# Patient Record
Sex: Female | Born: 1960 | Race: White | Hispanic: No | Marital: Married | Smoking: Former smoker
Health system: Southern US, Community
[De-identification: ages and names within clinical notes are randomized; demographics above are authoritative.]

## PROBLEM LIST (undated history)

## (undated) DIAGNOSIS — S6981XA Other specified injuries of right wrist, hand and finger(s), initial encounter: Secondary | ICD-10-CM

## (undated) DIAGNOSIS — I10 Essential (primary) hypertension: Secondary | ICD-10-CM

## (undated) DIAGNOSIS — N951 Menopausal and female climacteric states: Secondary | ICD-10-CM

## (undated) DIAGNOSIS — E039 Hypothyroidism, unspecified: Secondary | ICD-10-CM

## (undated) DIAGNOSIS — I313 Pericardial effusion (noninflammatory): Secondary | ICD-10-CM

## (undated) DIAGNOSIS — M5136 Other intervertebral disc degeneration, lumbar region: Secondary | ICD-10-CM

## (undated) DIAGNOSIS — N926 Irregular menstruation, unspecified: Secondary | ICD-10-CM

## (undated) DIAGNOSIS — G4700 Insomnia, unspecified: Secondary | ICD-10-CM

## (undated) DIAGNOSIS — E538 Deficiency of other specified B group vitamins: Secondary | ICD-10-CM

## (undated) DIAGNOSIS — S82832A Other fracture of upper and lower end of left fibula, initial encounter for closed fracture: Secondary | ICD-10-CM

## (undated) DIAGNOSIS — S52501A Unspecified fracture of the lower end of right radius, initial encounter for closed fracture: Secondary | ICD-10-CM

## (undated) DIAGNOSIS — M8580 Other specified disorders of bone density and structure, unspecified site: Secondary | ICD-10-CM

## (undated) DIAGNOSIS — R319 Hematuria, unspecified: Secondary | ICD-10-CM

## (undated) DIAGNOSIS — N200 Calculus of kidney: Secondary | ICD-10-CM

## (undated) DIAGNOSIS — H6983 Other specified disorders of Eustachian tube, bilateral: Secondary | ICD-10-CM

## (undated) DIAGNOSIS — Z780 Asymptomatic menopausal state: Secondary | ICD-10-CM

## (undated) DIAGNOSIS — F329 Major depressive disorder, single episode, unspecified: Secondary | ICD-10-CM

## (undated) DIAGNOSIS — S62151A Displaced fracture of hook process of hamate [unciform] bone, right wrist, initial encounter for closed fracture: Secondary | ICD-10-CM

## (undated) DIAGNOSIS — N6459 Other signs and symptoms in breast: Secondary | ICD-10-CM

## (undated) HISTORY — DX: Deficiency of other specified B group vitamins: E53.8

## (undated) HISTORY — DX: Other signs and symptoms in breast: N64.59

## (undated) HISTORY — DX: Major depressive disorder, single episode, unspecified: F32.9

## (undated) HISTORY — DX: Other specified disorders of eustachian tube, bilateral: H69.83

## (undated) HISTORY — DX: Calculus of kidney: N20.0

## (undated) HISTORY — DX: Other specified disorders of bone density and structure, unspecified site: M85.80

## (undated) HISTORY — PX: GYNECOLOGIC CRYOSURGERY: SHX857

## (undated) HISTORY — DX: Other fracture of upper and lower end of left fibula, initial encounter for closed fracture: S82.832A

## (undated) HISTORY — DX: Insomnia, unspecified: G47.00

## (undated) HISTORY — DX: Hematuria, unspecified: R31.9

## (undated) HISTORY — DX: Irregular menstruation, unspecified: N92.6

## (undated) HISTORY — DX: Hypothyroidism, unspecified: E03.9

## (undated) HISTORY — DX: Unspecified fracture of the lower end of right radius, initial encounter for closed fracture: S52.501A

## (undated) HISTORY — DX: Essential (primary) hypertension: I10

## (undated) HISTORY — DX: Displaced fracture of hook process of hamate (unciform) bone, right wrist, initial encounter for closed fracture: S62.151A

## (undated) HISTORY — DX: Menopausal and female climacteric states: N95.1

## (undated) HISTORY — DX: Pericardial effusion (noninflammatory): I31.3

## (undated) HISTORY — DX: Other intervertebral disc degeneration, lumbar region: M51.36

## (undated) HISTORY — DX: Asymptomatic menopausal state: Z78.0

## (undated) HISTORY — DX: Other specified injuries of right wrist, hand and finger(s), initial encounter: S69.81XA

---

## 2008-04-25 ENCOUNTER — Ambulatory Visit: Payer: Self-pay | Admitting: Family Medicine

## 2008-04-25 ENCOUNTER — Telehealth (INDEPENDENT_AMBULATORY_CARE_PROVIDER_SITE_OTHER): Payer: Self-pay | Admitting: *Deleted

## 2008-04-25 DIAGNOSIS — Z780 Asymptomatic menopausal state: Secondary | ICD-10-CM

## 2008-04-25 DIAGNOSIS — F329 Major depressive disorder, single episode, unspecified: Secondary | ICD-10-CM

## 2008-04-25 DIAGNOSIS — F3289 Other specified depressive episodes: Secondary | ICD-10-CM

## 2008-04-25 DIAGNOSIS — E039 Hypothyroidism, unspecified: Secondary | ICD-10-CM

## 2008-04-25 HISTORY — DX: Asymptomatic menopausal state: Z78.0

## 2008-04-25 HISTORY — DX: Major depressive disorder, single episode, unspecified: F32.9

## 2008-04-25 HISTORY — DX: Other specified depressive episodes: F32.89

## 2008-06-13 ENCOUNTER — Telehealth: Payer: Self-pay | Admitting: Family Medicine

## 2008-07-02 ENCOUNTER — Telehealth: Payer: Self-pay | Admitting: Family Medicine

## 2008-07-16 ENCOUNTER — Telehealth: Payer: Self-pay | Admitting: Family Medicine

## 2008-07-31 ENCOUNTER — Ambulatory Visit: Payer: Self-pay | Admitting: Family Medicine

## 2008-07-31 DIAGNOSIS — N926 Irregular menstruation, unspecified: Secondary | ICD-10-CM

## 2008-07-31 HISTORY — DX: Irregular menstruation, unspecified: N92.6

## 2008-08-01 LAB — CONVERTED CEMR LAB: TSH: 1.096 microintl units/mL (ref 0.350–4.500)

## 2008-08-07 ENCOUNTER — Telehealth (INDEPENDENT_AMBULATORY_CARE_PROVIDER_SITE_OTHER): Payer: Self-pay | Admitting: *Deleted

## 2008-10-15 ENCOUNTER — Encounter: Admission: RE | Admit: 2008-10-15 | Discharge: 2008-10-15 | Payer: Self-pay | Admitting: Family Medicine

## 2008-12-12 ENCOUNTER — Telehealth: Payer: Self-pay | Admitting: Family Medicine

## 2008-12-26 ENCOUNTER — Other Ambulatory Visit: Admission: RE | Admit: 2008-12-26 | Discharge: 2008-12-26 | Payer: Self-pay | Admitting: Family Medicine

## 2008-12-26 ENCOUNTER — Encounter: Payer: Self-pay | Admitting: Family Medicine

## 2008-12-26 ENCOUNTER — Ambulatory Visit: Payer: Self-pay | Admitting: Family Medicine

## 2008-12-26 DIAGNOSIS — E538 Deficiency of other specified B group vitamins: Secondary | ICD-10-CM

## 2008-12-26 HISTORY — DX: Deficiency of other specified B group vitamins: E53.8

## 2008-12-27 LAB — CONVERTED CEMR LAB
ALT: 10 units/L (ref 0–35)
Albumin: 4.7 g/dL (ref 3.5–5.2)
CO2: 21 meq/L (ref 19–32)
Calcium: 9.4 mg/dL (ref 8.4–10.5)
Chloride: 106 meq/L (ref 96–112)
Cholesterol: 157 mg/dL (ref 0–200)
Glucose, Bld: 92 mg/dL (ref 70–99)
Potassium: 4.5 meq/L (ref 3.5–5.3)
Sodium: 143 meq/L (ref 135–145)
Total Bilirubin: 0.8 mg/dL (ref 0.3–1.2)
Total Protein: 7.4 g/dL (ref 6.0–8.3)
Triglycerides: 182 mg/dL — ABNORMAL HIGH (ref <150)
Vitamin B-12: >2000 pg/mL — ABNORMAL HIGH (ref 211–911)

## 2009-03-21 ENCOUNTER — Telehealth: Payer: Self-pay | Admitting: Family Medicine

## 2009-04-08 ENCOUNTER — Ambulatory Visit: Payer: Self-pay | Admitting: Family Medicine

## 2009-04-08 ENCOUNTER — Telehealth: Payer: Self-pay | Admitting: Family Medicine

## 2009-04-08 ENCOUNTER — Encounter (INDEPENDENT_AMBULATORY_CARE_PROVIDER_SITE_OTHER): Payer: Self-pay | Admitting: *Deleted

## 2009-04-09 ENCOUNTER — Encounter (INDEPENDENT_AMBULATORY_CARE_PROVIDER_SITE_OTHER): Payer: Self-pay | Admitting: *Deleted

## 2009-04-11 ENCOUNTER — Encounter: Payer: Self-pay | Admitting: Family Medicine

## 2009-05-20 ENCOUNTER — Ambulatory Visit: Payer: Self-pay | Admitting: Family Medicine

## 2009-06-19 ENCOUNTER — Ambulatory Visit: Payer: Self-pay | Admitting: Family Medicine

## 2009-06-19 DIAGNOSIS — J019 Acute sinusitis, unspecified: Secondary | ICD-10-CM

## 2009-07-16 ENCOUNTER — Ambulatory Visit: Payer: Self-pay | Admitting: Family Medicine

## 2009-07-17 ENCOUNTER — Encounter (INDEPENDENT_AMBULATORY_CARE_PROVIDER_SITE_OTHER): Payer: Self-pay | Admitting: *Deleted

## 2009-09-22 ENCOUNTER — Ambulatory Visit: Payer: Self-pay | Admitting: Family Medicine

## 2009-09-23 LAB — CONVERTED CEMR LAB
Free T4: 1.14 ng/dL (ref 0.80–1.80)
T3, Free: 2.4 pg/mL (ref 2.3–4.2)
TSH: 2.217 microintl units/mL (ref 0.350–4.500)

## 2009-09-24 ENCOUNTER — Ambulatory Visit: Payer: Self-pay | Admitting: Family Medicine

## 2009-09-29 ENCOUNTER — Telehealth (INDEPENDENT_AMBULATORY_CARE_PROVIDER_SITE_OTHER): Payer: Self-pay | Admitting: *Deleted

## 2009-10-17 ENCOUNTER — Ambulatory Visit: Payer: Self-pay | Admitting: Family Medicine

## 2009-10-17 ENCOUNTER — Telehealth: Payer: Self-pay | Admitting: Family Medicine

## 2009-10-21 ENCOUNTER — Encounter: Admission: RE | Admit: 2009-10-21 | Discharge: 2009-10-21 | Payer: Self-pay | Admitting: Family Medicine

## 2009-10-23 ENCOUNTER — Telehealth: Payer: Self-pay | Admitting: Family Medicine

## 2009-10-28 ENCOUNTER — Ambulatory Visit: Payer: Self-pay | Admitting: Family Medicine

## 2009-11-13 ENCOUNTER — Telehealth: Payer: Self-pay | Admitting: Family Medicine

## 2009-11-26 ENCOUNTER — Ambulatory Visit: Payer: Self-pay | Admitting: Family Medicine

## 2010-01-19 ENCOUNTER — Encounter: Admission: RE | Admit: 2010-01-19 | Discharge: 2010-01-19 | Payer: Self-pay | Admitting: Family Medicine

## 2010-01-19 ENCOUNTER — Ambulatory Visit: Payer: Self-pay | Admitting: Family Medicine

## 2010-01-19 DIAGNOSIS — M79609 Pain in unspecified limb: Secondary | ICD-10-CM

## 2010-02-10 ENCOUNTER — Encounter: Payer: Self-pay | Admitting: Family Medicine

## 2010-02-10 ENCOUNTER — Ambulatory Visit: Payer: Self-pay | Admitting: Family Medicine

## 2010-02-10 DIAGNOSIS — R5381 Other malaise: Secondary | ICD-10-CM

## 2010-02-10 DIAGNOSIS — R5383 Other fatigue: Secondary | ICD-10-CM

## 2010-02-10 LAB — CONVERTED CEMR LAB
Glucose, Urine, Semiquant: NEGATIVE
Nitrite: NEGATIVE
Pap Smear: NORMAL
Specific Gravity, Urine: 1.015
pH: 5.5

## 2010-02-11 ENCOUNTER — Encounter: Payer: Self-pay | Admitting: Family Medicine

## 2010-02-11 LAB — CONVERTED CEMR LAB
ALT: 13 units/L (ref 0–35)
AST: 17 units/L (ref 0–37)
Basophils Absolute: 0 10*3/uL (ref 0.0–0.1)
Basophils Relative: 0 % (ref 0–1)
Creatinine, Ser: 0.71 mg/dL (ref 0.40–1.20)
Eosinophils Relative: 3 % (ref 0–5)
HCT: 41.1 % (ref 36.0–46.0)
Hemoglobin: 13.6 g/dL (ref 12.0–15.0)
LDL Cholesterol: 64 mg/dL (ref 0–99)
Lymphocytes Relative: 33 % (ref 12–46)
MCHC: 33.1 g/dL (ref 30.0–36.0)
Monocytes Absolute: 0.3 10*3/uL (ref 0.1–1.0)
Neutro Abs: 4.5 10*3/uL (ref 1.7–7.7)
Platelets: 274 10*3/uL (ref 150–400)
RDW: 12.8 % (ref 11.5–15.5)
Sodium: 141 meq/L (ref 135–145)
Total Bilirubin: 0.4 mg/dL (ref 0.3–1.2)
Total CHOL/HDL Ratio: 2.9
Total Protein: 7 g/dL (ref 6.0–8.3)
VLDL: 40 mg/dL (ref 0–40)
Vitamin B-12: 327 pg/mL (ref 211–911)

## 2010-02-12 ENCOUNTER — Encounter: Payer: Self-pay | Admitting: Family Medicine

## 2010-02-25 ENCOUNTER — Ambulatory Visit
Admission: RE | Admit: 2010-02-25 | Discharge: 2010-02-25 | Payer: Self-pay | Source: Home / Self Care | Attending: Family Medicine | Admitting: Family Medicine

## 2010-02-26 ENCOUNTER — Telehealth: Payer: Self-pay | Admitting: Family Medicine

## 2010-02-27 ENCOUNTER — Ambulatory Visit: Admit: 2010-02-27 | Payer: Self-pay | Admitting: Family Medicine

## 2010-03-20 ENCOUNTER — Ambulatory Visit
Admission: RE | Admit: 2010-03-20 | Discharge: 2010-03-20 | Payer: Self-pay | Source: Home / Self Care | Attending: Family Medicine | Admitting: Family Medicine

## 2010-03-24 NOTE — Letter (Signed)
Summary: Out of Work  [facility]  [address]   [place], [place] [address]   Phone: [phone]  Fax: [phone]    Jul 17, 2009   Employee:  JENNINGS STIRLING Gradillas    To Whom It May Concern:   For Medical reasons, please excuse the above named employee from work for the following dates:  Start:   07/16/2009  End:   07/18/2009  If you need additional information, please feel free to contact our office.         Sincerely,      Seymour Bars DO  Appended Document: Out of Work Faxed to [employer] [phone]

## 2010-03-24 NOTE — Progress Notes (Signed)
Summary: Trouble sleeping  Phone Note Call from Patient   Caller: Patient Summary of Call: Pt calls stating she has been having trouble sleeping but does not want to take Rx meds right now. I suggested Pt try OTC Melatonin. Pt agreed to try. Initial call taken by: Payton Spark CMA,  September 29, 2009 9:42 AM

## 2010-03-24 NOTE — Letter (Signed)
Summary: Out of Work  [facility]  [address]   [place], [place] [address]   Phone: [phone]  Fax: [phone]    January 19, 2010   Employee:  Rie L Helget    To Whom It May Concern:   For Medical reasons, please excuse the above named employee from work for the following dates:  Start:   Nov 29th  End:   Nov 30th  If you need additional information, please feel free to contact our office.         Sincerely,    Seymour Bars DO

## 2010-03-24 NOTE — Assessment & Plan Note (Signed)
Summary: B12//mpm  Nurse Visit   Vitals Entered By: Payton Spark CMA (September 24, 2009 3:16 PM)  Allergies: 1)  ! Wellbutrin  Medication Administration  Injection # 1:    Medication: Vit B12 1000 mcg    Diagnosis: B12 DEFICIENCY (ICD-266.2)    Route: IM    Site: L deltoid    Exp Date: 04/13    Lot #: [id number]    Patient tolerated injection without complications    Given by: Payton Spark CMA (September 24, 2009 3:16 PM)  Orders Added: 1)  Vit B12 1000 mcg [J3420] 2)  Admin of Therapeutic Inj  intramuscular or subcutaneous [96372]   Medication Administration  Injection # 1:    Medication: Vit B12 1000 mcg    Diagnosis: B12 DEFICIENCY (ICD-266.2)    Route: IM    Site: L deltoid    Exp Date: 04/13    Lot #: [id number]    Patient tolerated injection without complications    Given by: Payton Spark CMA (September 24, 2009 3:16 PM)  Orders Added: 1)  Vit B12 1000 mcg [J3420] 2)  Admin of Therapeutic Inj  intramuscular or subcutaneous [19147]

## 2010-03-24 NOTE — Letter (Signed)
Summary: Depression Questionnaire/[place] Kathryne Sharper  Depression Questionnaire/[place] Kathryne Sharper   Imported By: Lanelle Bal 04/11/2009 09:29:29  _____________________________________________________________________  External Attachment:    Type:   Image     Comment:   External Document

## 2010-03-24 NOTE — Assessment & Plan Note (Signed)
Summary: 4 MONTH FU/DT   Vital Signs:  Patient profile:   50 year old female Menstrual status:  regular Height:      64.75 inches Weight:      153 pounds BMI:     25.75 O2 Sat:      99 % on Room air Pulse rate:   73 / minute BP sitting:   123 / 82  (left arm) Cuff size:   regular  Vitals Entered By: Payton Spark CMA (September 22, 2009 3:42 PM)  O2 Flow:  Room air CC: F/U mood. C/o decreased libido.   Primary Care Provider:  Seymour Bars DO  CC:  F/U mood. C/o decreased libido.Marland Kitchen  History of Present Illness: 50 yo WF presents for f/u mood.  She has been on Lexapro for her mood.  She has not been exercising much.  She denies any changes in her diet.  She has gained weight with going thru perimenopause.  She does not like the lack of libido from her Lexapro.  She tried Buproprion but it gave her heart palptiations.  She is due to have her TSH checked, on synthroid.  Her support system is a friend and her husband.  She is upset about gaining 8 lbs.  She feels bad about herself and knows that she needs to start exercising.    Current Medications (verified): 1)  Synthroid 50 Mcg Tabs (Levothyroxine Sodium) .... Take 1 Tablet By Mouth Once A Day 2)  Ortho Tri-Cyclen Lo 0.18/0.215/0.25 Mg-25 Mcg Tabs (Norgestim-Eth Estrad Triphasic) .Marland Kitchen.. 1 Tab By Mouth Daily As Directed 3)  Lexapro 10 Mg Tabs (Escitalopram Oxalate) .Marland Kitchen.. 1 Tab By Mouth Daily 4)  Nasonex 50 Mcg/act Susp (Mometasone Furoate) .... 2 Sprays Per Nostril Daily  Allergies (verified): 1)  ! Wellbutrin  Past History:  Past Medical History: Hypothyroidism  since 2008 depression Perimenopausal  Family History: Reviewed history from 12/26/2008 and no changes required. mother died of breast cancer at 12, diagnosed at 30.  HTN, high chol, DM sister died of drug OD, suicide. sister alive, healthy  3 brothers healthy father healthy GM colon cancer at 75.  Social History: Reviewed history from 05/20/2009 and no changes  required. Quit smoking 2006. Works out regularly. Married.  4 sons.  Youngest at home. Not exercising.  Review of Systems      See HPI  Physical Exam  General:  alert, well-developed, well-nourished, and well-hydrated.   Head:  normocephalic and atraumatic.   Mouth:  pharynx pink and moist.   Neck:  no masses.   Lungs:  Normal respiratory effort, chest expands symmetrically. Lungs are clear to auscultation, no crackles or wheezes. Heart:  Normal rate and regular rhythm. S1 and S2 normal without gallop, murmur, click, rub or other extra sounds. Skin:  color normal.   Cervical Nodes:  No lymphadenopathy noted Psych:  good eye contact, not anxious appearing, and flat affect.     Impression & Recommendations:  Problem # 1:  DEPRESSION, MILD (ICD-311) PHQ-9 score of 10.  She wants to wean off Lexapro due to wt gain and lack of libido.  Will taper off x 4 days (1/2 tab).  Start regular exercise.  Consider counseling.  Call if any worsening in mood off meds. Her updated medication list for this problem includes:    Lexapro 10 Mg Tabs (Escitalopram oxalate) .Marland Kitchen... 1 tab by mouth daily  Problem # 2:  UNSPECIFIED HYPOTHYROIDISM (ICD-244.9)  REcheck TSH.   Her updated medication list for this  problem includes:    Synthroid 50 Mcg Tabs (Levothyroxine sodium) .Marland Kitchen... Take 1 tablet by mouth once a day  Orders: T-TSH ([address]) T-T4, Free [phone]) T-T3, Free [phone])  Labs Reviewed: TSH: 1.550 (12/26/2008)    Chol: 157 (12/26/2008)   HDL: 50 (12/26/2008)   LDL: 71 (12/26/2008)   TG: 182 (12/26/2008)  Problem # 3:  B12 DEFICIENCY (ICD-266.2)  Orders: T-Vitamin B12 ([address]-96295)  Complete Medication List: 1)  Synthroid 50 Mcg Tabs (Levothyroxine sodium) .... Take 1 tablet by mouth once a day 2)  Ortho Tri-cyclen Lo 0.18/0.215/0.25 Mg-25 Mcg Tabs (Norgestim-eth estrad triphasic) .Marland Kitchen.. 1 tab by mouth daily as directed 3)  Lexapro 10 Mg Tabs (Escitalopram oxalate) .Marland Kitchen.. 1 tab  by mouth daily 4)  Nasonex 50 Mcg/act Susp (Mometasone furoate) .... 2 sprays per nostril daily  Patient Instructions: 1)  Labs today. 2)  Will call you w/ results tomorrow and will adjust your thyroid dose accordingly. 3)  Work on healthy (low carb) high protein diet. 4)  Increase Exercise to 1 hr 4-5 days/ wk. 5)  Cut Lexapro in 1/2 x 4 days then stop. 6)  Call if any worsening in mood.

## 2010-03-24 NOTE — Progress Notes (Signed)
Summary: meds  Phone Note Call from Patient   Caller: Patient Call For: Dr. Linford Arnold Summary of Call: Pt wants to try the Wellbutrin instead.Pt is afraid the Prozac will make her gain wt. [facility] [place] Initial call taken by: Avon Gully CMA, Duncan Dull),  October 17, 2009 1:46 PM  Follow-up for Phone Call        Prozac won't cause weight gain. It is very weight neutral. Some people get is confused with paxil which does cause weight gaine. Let me know if still wants to change.  Follow-up by: Nani Gasser MD,  October 17, 2009 1:51 PM  Additional Follow-up for Phone Call Additional follow up Details #1::        Pt still wants to try the Wellbutrin and needs name brand cause generic causes her heart to race Additional Follow-up by: Kathlene November,  October 17, 2009 1:58 PM    New/Updated Medications: WELLBUTRIN XL 150 MG XR24H-TAB (BUPROPION HCL) Take 1 tablet by mouth once a day [BMN] Prescriptions: WELLBUTRIN XL 150 MG XR24H-TAB (BUPROPION HCL) Take 1 tablet by mouth once a day Brand medically necessary #30 x 0   Entered and Authorized by:   Nani Gasser MD   Signed by:   Nani Gasser MD on 10/17/2009   Method used:   Electronically to        [facility] [phone]* (retail)       [address] [place], [place]  [address]       Ph: [phone]       Fax: [phone]   RxID:   [id number]

## 2010-03-24 NOTE — Letter (Signed)
Summary: Out of Work  [facility]  [address]   [place], [place] [address]   Phone: [phone]  Fax: [phone]    October 28, 2009   Employee:  ALYZA ARTIAGA Tiedt    To Whom It May Concern:   For Medical reasons, please excuse the above named employee from work for the following dates:  Start:   Sept 5th - 6th  End:   Sept 7th  If you need additional information, please feel free to contact our office.         Sincerely,    Seymour Bars DO

## 2010-03-24 NOTE — Assessment & Plan Note (Signed)
Summary: Acute sinusitis   Vital Signs:  Patient profile:   50 year old female Menstrual status:  regular Height:      64.75 inches O2 Sat:      98 % on Room air Temp:     98.7 degrees F oral Pulse rate:   98 / minute BP sitting:   114 / 77  (left arm) Cuff size:   regular  Vitals Entered By: Kathlene November (June 19, 2009 1:36 PM)  O2 Flow:  Room air CC: hoarseness, sore throat, head and chest congestion, cough. [facility] and [facility] nothing strep was negative   Primary Care Provider:  Seymour Bars DO  CC:  hoarseness, sore throat, head and chest congestion, and cough. [facility] and she said they gave her nothing strep was negative.  History of Present Illness: hoarseness, sore throat, head and chest congestion, cough for 5 days. + HA.  Not sleeping because throat feels swollen.  No GI sxs.   [facility] and she said they gave her nothing strep was negative. Using Tyelnol and chloroseptic spray.  + fever to 100.  No cough.  No hx of allergies.   Current Medications (verified): 1)  Synthroid 50 Mcg Tabs (Levothyroxine Sodium) .... Take 1 Tablet By Mouth Once A Day 2)  Ortho Tri-Cyclen Lo 0.18/0.215/0.25 Mg-25 Mcg Tabs (Norgestim-Eth Estrad Triphasic) .Marland Kitchen.. 1 Tab By Mouth Daily As Directed 3)  Lexapro 10 Mg Tabs (Escitalopram Oxalate) .Marland Kitchen.. 1 Tab By Mouth Daily  Allergies (verified): 1)  ! Wellbutrin  Comments:  Nurse/Medical Assistant: The patient's medications and allergies were reviewed with the patient and were updated in the Medication and Allergy Lists. Kathlene November (June 19, 2009 1:38 PM)  Physical Exam  General:  Well-developed,well-nourished,in no acute distress; alert,appropriate and cooperative throughout examination Head:  Normocephalic and atraumatic without obvious abnormalities. No apparent alopecia or balding. NO facial pain or pressure with palptation.  Eyes:  No corneal or  conjunctival inflammation noted. EOMI. Perrla.  Ears:  External ear exam shows no significant lesions or deformities.  Otoscopic examination reveals clear canals, tympanic membranes are intact bilaterally without bulging, retraction, inflammation or discharge. Hearing is grossly normal bilaterally. Nose:  External nasal examination shows no deformity or inflammation. Nasal mucosa are pink and moist without lesions or exudates. Mouth:  Oral mucosa and oropharynx without lesions or exudates.  Teeth in good repair. Neck:  No deformities, masses, or tenderness noted. Lungs:  Normal respiratory effort, chest expands symmetrically. Lungs are clear to auscultation, no crackles or wheezes. Heart:  Normal rate and regular rhythm. S1 and S2 normal without gallop, murmur, click, rub or other extra sounds. Skin:  no rashes.   Cervical Nodes:  No lymphadenopathy noted Psych:  Cognition and judgment appear intact. Alert and cooperative with normal attention span and concentration. No apparent delusions, illusions, hallucinations   Impression & Recommendations:  Problem # 1:  SINUSITIS - ACUTE-NOS (ICD-461.9) Likely viral. if not better in 3-4 days can fill the ABX rx. Given cough med for bedtime. Call iif not better in one week. Call sooner if gets worse. Symptomatic care for acute sxs.  Her updated medication list for this problem includes:    Doxycycline Hyclate 100 Mg Caps (Doxycycline hyclate) .Marland Kitchen... Take 1 tablet by mouth two times a day for 10 days    Hydrocodone-homatropine 5-1.5 Mg/23ml Syrp (Hydrocodone-homatropine) .Marland KitchenMarland KitchenMarland KitchenMarland Kitchen 5ml pf at bedtime as needed cough  Complete Medication  List: 1)  Synthroid 50 Mcg Tabs (Levothyroxine sodium) .... Take 1 tablet by mouth once a day 2)  Ortho Tri-cyclen Lo 0.18/0.215/0.25 Mg-25 Mcg Tabs (Norgestim-eth estrad triphasic) .Marland Kitchen.. 1 tab by mouth daily as directed 3)  Lexapro 10 Mg Tabs (Escitalopram oxalate) .Marland Kitchen.. 1 tab by mouth daily 4)  Doxycycline Hyclate 100 Mg Caps  (Doxycycline hyclate) .... Take 1 tablet by mouth two times a day for 10 days 5)  Hydrocodone-homatropine 5-1.5 Mg/14ml Syrp (Hydrocodone-homatropine) .... 5ml pf at bedtime as needed cough   Patient Instructions: 1)  Call if not better in one week please call the office.  2)  Can fill the Antibiotic this weekend if not better or getting worse.  3)  The cough medicine is sedating.  Prescriptions: HYDROCODONE-HOMATROPINE 5-1.5 MG/5ML SYRP (HYDROCODONE-HOMATROPINE) 5ml pf at bedtime as needed cough  #119ml x 0   Entered and Authorized by:   Nani Gasser MD   Signed by:   Nani Gasser MD on 06/19/2009   Method used:   Print then Give to Patient   RxID:   [id number] DOXYCYCLINE HYCLATE 100 MG CAPS (DOXYCYCLINE HYCLATE) Take 1 tablet by mouth two times a day for 10 days  #20 x 0   Entered and Authorized by:   Nani Gasser MD   Signed by:   Nani Gasser MD on 06/19/2009   Method used:   Print then Give to Patient   RxID:   [id number]

## 2010-03-24 NOTE — Assessment & Plan Note (Signed)
Summary: R foot pain   Vital Signs:  Patient profile:   50 year old female Menstrual status:  regular Height:      64.75 inches Weight:      149 pounds BMI:     25.08 O2 Sat:      98 % on Room air Pulse rate:   84 / minute BP sitting:   134 / 92  (left arm) Cuff size:   regular  Vitals Entered By: Payton Spark CMA (January 19, 2010 3:02 PM)  O2 Flow:  Room air CC: R foot pain and swelling x 1 week.   Primary Care Montasia Chisenhall:  Seymour Bars DO  CC:  R foot pain and swelling x 1 week.Marland Kitchen  History of Present Illness: 50 yo WF presents for R foot pain and swelling x 1 wk but denies overuse or trauma.  She feels like she is walking on a pebble.  Unchanged by use of footwear.  Swells worse at the end of the day.  Worse with walking.  No nocturnal pain.  No ice.  Has used ibuprofen.  Denies any real change to her exercise program.    Current Medications (verified): 1)  Synthroid 50 Mcg Tabs (Levothyroxine Sodium) .... Take 1 Tablet By Mouth Once A Day 2)  Ortho Tri-Cyclen Lo 0.18/0.215/0.25 Mg-25 Mcg Tabs (Norgestim-Eth Estrad Triphasic) .Marland Kitchen.. 1 Tab By Mouth Daily As Directed 3)  Omnaris 50 Mcg/act Susp (Ciclesonide) .... 2 Sprays Per Nostril Daily 4)  Zoloft 50 Mg Tabs (Sertraline Hcl) .... 1/2 Tab By Mouth Daily X 1 Wk Then Increase To 1 Tab By Mouth Daily  Allergies (verified): 1)  ! Wellbutrin 2)  Amoxicillin  Past History:  Past Medical History: Reviewed history from 09/22/2009 and no changes required. Hypothyroidism  since 2008 depression Perimenopausal  Social History: Reviewed history from 05/20/2009 and no changes required. Quit smoking 2006. Works out regularly. Married.  4 sons.  Youngest at home. Not exercising.  Review of Systems      See HPI  Physical Exam  General:  alert, well-developed, well-nourished, and well-hydrated.   Msk:  point tender over distal 2nd and 3rd metatarsals.  neg anterior draer and talar tilt test Pulses:  2+ R pedal  pulses Extremities:  mild R pedal edema over dorsum of foot  Neurologic:  gait normal.   Skin:  no bruising or redness   Impression & Recommendations:  Problem # 1:  FOOT PAIN, RIGHT (ICD-729.5) non - traumatic.  DDX includes neuroma, stress fx or metatarsalgia. Will xray to r/o stress fx.  If neg, will refer to podiatry for eval and treatment of the latter 2.  OK to elevate, ice and use NSAIDs for now. Orders: T-DG Foot 2 Views*R* (60454)  Complete Medication List: 1)  Synthroid 50 Mcg Tabs (Levothyroxine sodium) .... Take 1 tablet by mouth once a day 2)  Ortho Tri-cyclen Lo 0.18/0.215/0.25 Mg-25 Mcg Tabs (Norgestim-eth estrad triphasic) .Marland Kitchen.. 1 tab by mouth daily as directed 3)  Omnaris 50 Mcg/act Susp (Ciclesonide) .... 2 sprays per nostril daily 4)  Zoloft 50 Mg Tabs (Sertraline hcl) .... 1/2 tab by mouth daily x 1 wk then increase to 1 tab by mouth daily  Other Orders: Vit B12 1000 mcg (J3420) Admin of Therapeutic Inj  intramuscular or subcutaneous ([address])   Medication Administration  Injection # 1:    Medication: Vit B12 1000 mcg    Diagnosis: B12 DEFICIENCY (ICD-266.2)    Route: IM    Site: R deltoid  Patient tolerated injection without complications    Given by: Payton Spark CMA (January 19, 2010 3:04 PM)  Orders Added: 1)  Vit B12 1000 mcg [J3420] 2)  Admin of Therapeutic Inj  intramuscular or subcutaneous [96372] 3)  T-DG Foot 2 Views*R* [73620] 4)  Est. Patient Level III [[address]]     Medication Administration  Injection # 1:    Medication: Vit B12 1000 mcg    Diagnosis: B12 DEFICIENCY (ICD-266.2)    Route: IM    Site: R deltoid    Patient tolerated injection without complications    Given by: Payton Spark CMA (January 19, 2010 3:04 PM)  Orders Added: 1)  Vit B12 1000 mcg [J3420] 2)  Admin of Therapeutic Inj  intramuscular or subcutaneous [96372] 3)  T-DG Foot 2 Views*R* [73620] 4)  Est. Patient Level III [[address]]

## 2010-03-24 NOTE — Letter (Signed)
Summary: Out of Work  [facility]  [address], [address]   [place], [place] [address]   Phone: [phone]  Fax: [phone]    October 17, 2009   Employee:  Dale L Wiltsie    To Whom It May Concern:   For Medical reasons, please excuse the above named employee from work for the following dates:  Start:   10-17-2009  End:   10-18-2009  If you need additional information, please feel free to contact our office.         Sincerely,    Nani Gasser MD

## 2010-03-24 NOTE — Medication Information (Signed)
Summary: Prior Authorization for Lexapro/[facility]  Prior Authorization for Lexapro/[facility]   Imported By: Lanelle Bal 04/11/2009 09:30:32  _____________________________________________________________________  External Attachment:    Type:   Image     Comment:   External Document

## 2010-03-24 NOTE — Progress Notes (Signed)
Summary: Requests Zoloft  Phone Note Call from Patient   Caller: Patient Summary of Call: Pt states she didn't want to take prozac suggested by Dr. Linford Arnold so Dr. Linford Arnold called in Wellbutrin (per Pt's request). Wellbutrin is too expensive so Pt calls today and requests Zoloft. Please advise. Initial call taken by: Payton Spark CMA,  October 23, 2009 11:40 AM    New/Updated Medications: ZOLOFT 50 MG TABS (SERTRALINE HCL) 1/2 tab by mouth daily x 1 wk then increase to 1 tab by mouth daily Prescriptions: ZOLOFT 50 MG TABS (SERTRALINE HCL) 1/2 tab by mouth daily x 1 wk then increase to 1 tab by mouth daily  #30 x 2   Entered and Authorized by:   Seymour Bars DO   Signed by:   Seymour Bars DO on 10/23/2009   Method used:   Electronically to        [facility] [phone]* (retail)       [address] [place], [place]  [address]       Ph: [phone]       Fax: [phone]   RxID:   [id number]   Appended Document: Requests Zoloft 10/23/2009 @ 11:50am- Pt notified med sent to pharmacy. KJ LPN

## 2010-03-24 NOTE — Progress Notes (Signed)
Summary: Restart Wellbutrin  Phone Note Call from Patient   Caller: Patient Summary of Call: Pt would like to restart wellbutrin. Is this OK?  Please advise. Initial call taken by: Payton Spark CMA,  March 21, 2009 9:23 AM    New/Updated Medications: BUPROPION HCL 150 MG XR24H-TAB (BUPROPION HCL) 1 tab by mouth qAM Prescriptions: BUPROPION HCL 150 MG XR24H-TAB (BUPROPION HCL) 1 tab by mouth qAM  #30 x 2   Entered and Authorized by:   Seymour Bars DO   Signed by:   Seymour Bars DO on 03/21/2009   Method used:   Electronically to        [facility] [phone]* (retail)       [address] [place], [place]  [address]       Ph: [phone]       Fax: [phone]   RxID:   [id number]

## 2010-03-24 NOTE — Assessment & Plan Note (Signed)
Summary: B12 shot - jr  Nurse Visit   Vitals Entered By: Payton Spark CMA (November 26, 2009 3:16 PM)  Allergies: 1)  ! Wellbutrin 2)  Amoxicillin  Medication Administration  Injection # 1:    Medication: Vit B12 1000 mcg    Diagnosis: B12 DEFICIENCY (ICD-266.2)    Route: IM    Site: L deltoid    Exp Date: 06/2011    Lot #: [id number]    Patient tolerated injection without complications    Given by: Payton Spark CMA (November 26, 2009 3:16 PM)  Orders Added: 1)  Vit B12 1000 mcg [J3420] 2)  Admin of Therapeutic Inj  intramuscular or subcutaneous [96372]   Medication Administration  Injection # 1:    Medication: Vit B12 1000 mcg    Diagnosis: B12 DEFICIENCY (ICD-266.2)    Route: IM    Site: L deltoid    Exp Date: 06/2011    Lot #: [id number]    Patient tolerated injection without complications    Given by: Payton Spark CMA (November 26, 2009 3:16 PM)  Orders Added: 1)  Vit B12 1000 mcg [J3420] 2)  Admin of Therapeutic Inj  intramuscular or subcutaneous [19147]

## 2010-03-24 NOTE — Letter (Signed)
Summary: Depression Questionnaire/[name]  Depression Questionnaire/[name]   Imported By: Lanelle Bal 06/02/2009 10:09:15  _____________________________________________________________________  External Attachment:    Type:   Image     Comment:   External Document

## 2010-03-24 NOTE — Progress Notes (Signed)
Summary: [facility] CASE # [id number] FOR LEXAPRO   Phone Note Outgoing Call   Call placed by: [facility] Call placed to: [facility]  Summary of Call: [facility] CASE # [id number] FOR LEXAPRO 10 MG TABLETS FORM FAXED TO [facility]  Initial call taken by: Roselle Locus,  April 08, 2009 1:44 PM

## 2010-03-24 NOTE — Progress Notes (Signed)
Summary: Still sick  Phone Note Call from Patient   Caller: Patient Summary of Call: Pt calls stating she has been taking OTC cold medicine and she feels no better. Pt states she feels that the congestion has moved into her chest. Pt states you told her to CB if she has not improved.  Initial call taken by: Payton Spark CMA,  November 13, 2009 10:25 AM    New/Updated Medications: AMOXICILLIN 500 MG CAPS (AMOXICILLIN) 1 capsule by mouth three times a day x 7 days Prescriptions: AMOXICILLIN 500 MG CAPS (AMOXICILLIN) 1 capsule by mouth three times a day x 7 days  #21 x 0   Entered and Authorized by:   Seymour Bars DO   Signed by:   Seymour Bars DO on 11/13/2009   Method used:   Electronically to        [facility] [phone]* (retail)       [address] [place], [place]  [address]       Ph: [phone]       Fax: [phone]   RxID:   [id number]   Appended Document: Still sick Pt states she prefers not to take Amox bc it is the ONLY ABX that causes her very bad yeast infection. Pt requests ABX be changed. Arvilla Market CMA, Michelle November 13, 2009 10:34 AM   I changed her to Zithromax. Let pharmacy know to re- shelf the Amox.  Seymour Bars, D.O.  Appended Document: Still sick pharm called

## 2010-03-24 NOTE — Assessment & Plan Note (Signed)
Summary: Elevated BP, Mood   Vital Signs:  Patient profile:   50 year old female Menstrual status:  regular Height:      64.75 inches Weight:      151 pounds Pulse rate:   83 / minute BP sitting:   140 / 87  (left arm) Cuff size:   regular  Vitals Entered By: Kathlene November (October 17, 2009 12:55 PM) CC: BP check. Got shaky at work and BP 140/87   Primary Care Provider:  Seymour Bars DO  CC:  BP check. Got shaky at work and BP 140/87.  History of Present Illness: BP check. Got shaky at work and BP 140/87. Had extremely bad and stressful morning at work.  Checked BP and it was borderline high. She normally runs in the 120s.  She is off her lexapro b/o sexual side effects, thought she felt it worked well for her mood overall.  The sexual side effects were more bothersome to her husband.  She has been more irritable since being off the medication. She thinks she may need to restart something.  She wants to know if she can get her B12 shot a week early since she is here today.   Current Medications (verified): 1)  Synthroid 50 Mcg Tabs (Levothyroxine Sodium) .... Take 1 Tablet By Mouth Once A Day 2)  Ortho Tri-Cyclen Lo 0.18/0.215/0.25 Mg-25 Mcg Tabs (Norgestim-Eth Estrad Triphasic) .Marland Kitchen.. 1 Tab By Mouth Daily As Directed 3)  Nasonex 50 Mcg/act Susp (Mometasone Furoate) .... 2 Sprays Per Nostril Daily  Allergies (verified): 1)  ! Wellbutrin  Social History: Reviewed history from 05/20/2009 and no changes required. Quit smoking 2006. Works out regularly. Married.  4 sons.  Youngest at home. Not exercising.  Physical Exam  General:  Well-developed,well-nourished,in no acute distress; alert,appropriate and cooperative throughout examination   Impression & Recommendations:  Problem # 1:  DEPRESSION, MILD (ICD-311) Would like to restart a medication. Dicussed options. She gets palps with WEllbutrin so wil avoid this at this time. Will choose another SSRI and see if still helps her  mood but without the sexual side effects.   The following medications were removed from the medication list:    Lexapro 10 Mg Tabs (Escitalopram oxaliate) .Marland Kitchen... 1 tab by mouth daily Her updated medication list for this problem includes:    Fluoxetine Hcl 20 Mg Caps (Fluoxetine hcl) .Marland Kitchen... 1/2 by mouth once daily for one week then increase to whole tab dail.  Complete Medication List: 1)  Synthroid 50 Mcg Tabs (Levothyroxine sodium) .... Take 1 tablet by mouth once a day 2)  Ortho Tri-cyclen Lo 0.18/0.215/0.25 Mg-25 Mcg Tabs (Norgestim-eth estrad triphasic) .Marland Kitchen.. 1 tab by mouth daily as directed 3)  Nasonex 50 Mcg/act Susp (Mometasone furoate) .... 2 sprays per nostril daily 4)  Fluoxetine Hcl 20 Mg Caps (Fluoxetine hcl) .... 1/2 by mouth once daily for one week then increase to whole tab dail.  Other Orders: Vit B12 1000 mcg (J3420) Admin of Therapeutic Inj  intramuscular or subcutaneous (84132)  Patient Instructions: 1)  Please schedule a follow-up appointment in 3  weeks to discuss mood with Dr. Leonard Schwartz.  Prescriptions: FLUOXETINE HCL 20 MG CAPS (FLUOXETINE HCL) 1/2 by mouth once daily for one week then increase to whole tab dail.  #30 x 0   Entered and Authorized by:   Nani Gasser MD   Signed by:   Nani Gasser MD on 10/17/2009   Method used:   Electronically to  [facility] [address]* (retail)       [address]       [place], [place]  [address]       Ph: [phone]       Fax: [phone]   RxID:   [id number]      Medication Administration  Injection # 1:    Medication: Vit B12 1000 mcg    Diagnosis: B12 DEFICIENCY (ICD-266.2)    Route: IM    Site: R deltoid    Exp Date: 03/26/2011    Lot #: [id number]    Mfr: [employer] Regent    Patient tolerated injection without complications    Given by: Avon Gully CMA, Duncan Dull) (October 17, 2009 2:25 PM)  Orders Added: 1)  Est. Patient Level III [01027] 2)  Vit B12 1000 mcg [J3420] 3)  Admin of Therapeutic Inj   intramuscular or subcutaneous [25366]

## 2010-03-24 NOTE — Assessment & Plan Note (Signed)
Summary: Sinus infection//mpm   Vital Signs:  Patient profile:   50 year old female Menstrual status:  regular Height:      64.75 inches Weight:      145 pounds BMI:     24.40 O2 Sat:      100 % on Room air Temp:     98.9 degrees F oral Pulse rate:   81 / minute BP sitting:   121 / 84  (left arm) Cuff size:   regular  Vitals Entered By: Payton Spark CMA (Jul 16, 2009 3:22 PM)  O2 Flow:  Room air CC: ST and ear pain. Was on ABX 3 wks ago but now Sxs are back.    Primary Care Provider:  Seymour Bars DO  CC:  ST and ear pain. Was on ABX 3 wks ago but now Sxs are back. .  History of Present Illness: 50 yo WF presents for 4 days of sore throat, cough, congestion and ear pressure.  No fevers.  Denies any problems with allergies.  She has nighttime postnasal drip that has been more chronic.  She just took Doxycycline for 10 days and finished that up a wk ago.  Got better on meds, now worse but her husband has been sick.  No sinus pain or pressure.    Current Medications (verified): 1)  Synthroid 50 Mcg Tabs (Levothyroxine Sodium) .... Take 1 Tablet By Mouth Once A Day 2)  Ortho Tri-Cyclen Lo 0.18/0.215/0.25 Mg-25 Mcg Tabs (Norgestim-Eth Estrad Triphasic) .Marland Kitchen.. 1 Tab By Mouth Daily As Directed 3)  Lexapro 10 Mg Tabs (Escitalopram Oxalate) .Marland Kitchen.. 1 Tab By Mouth Daily  Allergies (verified): 1)  ! Wellbutrin  Past History:  Past Medical History: Reviewed history from 04/25/2008 and no changes required. Hypothyroidism  since 2008 Perimenopausal  Social History: Reviewed history from 05/20/2009 and no changes required. Quit smoking 2006. Works out regularly. Married.  4 sons.  Youngest at home. Not exercising.  Review of Systems      See HPI  Physical Exam  General:  alert, well-developed, well-nourished, and well-hydrated.   Head:  normocephalic and atraumatic.  sinuses NTTP Eyes:  conjunctiva clear, slightly watery Ears:  EACs patent; TMs translucent and gray with good  cone of light and bony landmarks.  Nose:  boggy turbinates, scant clear rhinorrhea Mouth:  o/p injected with vesicles Neck:  no masses.   Lungs:  Normal respiratory effort, chest expands symmetrically. Lungs are clear to auscultation, no crackles or wheezes. Heart:  Normal rate and regular rhythm. S1 and S2 normal without gallop, murmur, click, rub or other extra sounds. Skin:  color normal.   Cervical Nodes:  shotty anterior cervical chain LA Psych:  good eye contact, not anxious appearing, and not depressed appearing.     Impression & Recommendations:  Problem # 1:  SINUSITIS - ACUTE-NOS (ICD-461.9) Re- treat iwth Zithromax + Nasonex + Advil Cold and sinus.  It appears that her infection came back from either poor treated underlying allergies (this time she is to STAY on Nasonex) OR from re-infection, husband is sick.  Call if not improved in 10 days. The following medications were removed from the medication list:    Doxycycline Hyclate 100 Mg Caps (Doxycycline hyclate) .Marland Kitchen... Take 1 tablet by mouth two times a day for 10 days    Hydrocodone-homatropine 5-1.5 Mg/24ml Syrp (Hydrocodone-homatropine) .Marland KitchenMarland KitchenMarland KitchenMarland Kitchen 5ml pf at bedtime as needed cough Her updated medication list for this problem includes:    Zithromax Z-pak 250 Mg Tabs (Azithromycin) .Marland KitchenMarland KitchenMarland KitchenMarland Kitchen  2 tabs by mouth x 1 day then 1 tab by mouth daily x 4 days    Nasonex 50 Mcg/act Susp (Mometasone furoate) .Marland Kitchen... 2 sprays per nostril daily  Complete Medication List: 1)  Synthroid 50 Mcg Tabs (Levothyroxine sodium) .... Take 1 tablet by mouth once a day 2)  Ortho Tri-cyclen Lo 0.18/0.215/0.25 Mg-25 Mcg Tabs (Norgestim-eth estrad triphasic) .Marland Kitchen.. 1 tab by mouth daily as directed 3)  Lexapro 10 Mg Tabs (Escitalopram oxalate) .Marland Kitchen.. 1 tab by mouth daily 4)  Zithromax Z-pak 250 Mg Tabs (Azithromycin) .... 2 tabs by mouth x 1 day then 1 tab by mouth daily x 4 days 5)  Nasonex 50 Mcg/act Susp (Mometasone furoate) .... 2 sprays per nostril daily  Patient  Instructions: 1)  Take 5 days of Zithromax. 2)  Use Nasonex daily for the next 4 wks. 3)  Lots of clear fluids. 4)  Advil Cold and sinus OK to take in addition. Prescriptions: NASONEX 50 MCG/ACT SUSP (MOMETASONE FUROATE) 2 sprays per nostril daily  #1 bottle x 2   Entered and Authorized by:   Seymour Bars DO   Signed by:   Seymour Bars DO on 07/16/2009   Method used:   Electronically to        [facility] [phone]* (retail)       [address] [place], [place]  [address]       Ph: [phone]       Fax: [phone]   RxID:   [id number] ZITHROMAX Z-PAK 250 MG TABS (AZITHROMYCIN) 2 tabs by mouth x 1 day then 1 tab by mouth daily x 4 days  #1 pack x 0   Entered and Authorized by:   Seymour Bars DO   Signed by:   Seymour Bars DO on 07/16/2009   Method used:   Electronically to        [facility] [phone]* (retail)       [address] [place], [place]  [address]       Ph: [phone]       Fax: [phone]   RxID:   [id number]

## 2010-03-24 NOTE — Medication Information (Signed)
Summary: Approval for Lexapro/[facility]  Approval for Lexapro/[facility]   Imported By: Lanelle Bal 04/18/2009 08:07:49  _____________________________________________________________________  External Attachment:    Type:   Image     Comment:   External Document

## 2010-03-24 NOTE — Letter (Signed)
Summary: Depression Questionnaire  Depression Questionnaire   Imported By: Lanelle Bal 10/17/2009 12:06:42  _____________________________________________________________________  External Attachment:    Type:   Image     Comment:   External Document

## 2010-03-24 NOTE — Assessment & Plan Note (Signed)
Summary: f/u mood   Vital Signs:  Patient profile:   50 year old female Menstrual status:  regular Height:      64.75 inches Weight:      146 pounds BMI:     24.57 O2 Sat:      100 % on Room air Pulse rate:   74 / minute BP sitting:   125 / 84  (left arm) Cuff size:   regular  Vitals Entered By: Payton Spark CMA (May 20, 2009 8:43 AM)  O2 Flow:  Room air CC: F/U mood. Doing well on Lexapro   Primary Care Provider:  Seymour Bars DO  CC:  F/U mood. Doing well on Lexapro.  History of Present Illness: 50 yo WF presents for f/u depression.  Started on Lexapro 10 mg in the evenings about 2 mos.  No change in sleep.  No change in energy level.  Her husband has noticed some changes.  She plans to start exercising with a friend.    No adverse SEs.  She is still having hot flashes.  She does not want to go on anything for this.        Current Medications (verified): 1)  Synthroid 50 Mcg Tabs (Levothyroxine Sodium) .... Take 1 Tablet By Mouth Once A Day 2)  Ortho Tri-Cyclen Lo 0.18/0.215/0.25 Mg-25 Mcg Tabs (Norgestim-Eth Estrad Triphasic) .Marland Kitchen.. 1 Tab By Mouth Daily As Directed 3)  Lexapro 10 Mg Tabs (Escitalopram Oxalate) .Marland Kitchen.. 1 Tab By Mouth Daily  Allergies (verified): 1)  ! Wellbutrin  Past History:  Past Medical History: Reviewed history from 04/25/2008 and no changes required. Hypothyroidism  since 2008 Perimenopausal  Past Surgical History: Reviewed history from 04/25/2008 and no changes required. cryosurgery for cervicitis  Family History: Reviewed history from 12/26/2008 and no changes required. mother died of breast cancer at 49, diagnosed at 45.  HTN, high chol, DM sister died of drug OD, suicide. sister alive, healthy  3 brothers healthy father healthy GM colon cancer at 26.  Social History: Reviewed history from 04/25/2008 and no changes required. Quit smoking 2006. Works out regularly. Married.  4 sons.  Youngest at home. Not exercising.  Review  of Systems      See HPI Psych:  Denies anxiety, depression, easily angered, easily tearful, irritability, panic attacks, suicidal thoughts/plans, thoughts of violence, and thoughts /plans of harming others.  Physical Exam  General:  alert, well-developed, well-nourished, and well-hydrated.   Head:  normocephalic and atraumatic.   Skin:  color normal.   Psych:  good eye contact, not anxious appearing, and flat affect.     Impression & Recommendations:  Problem # 1:  DEPRESSION, MILD (ICD-311) PHQ-9 score dropped from 15 --> 2 indicating clinical remission.  Will continue current dose of Lexapro.  Has a good support system but really needs to improve her diet, exercise and sleep to improve her energy level.  F/U in 4 mos. Her updated medication list for this problem includes:    Lexapro 10 Mg Tabs (Escitalopram oxalate) .Marland Kitchen... 1 tab by mouth daily  Problem # 2:  UNSPECIFIED HYPOTHYROIDISM (ICD-244.9) Order given for TSH to be drawn in May.  Will adjust her Synthroid accordingly. Her updated medication list for this problem includes:    Synthroid 50 Mcg Tabs (Levothyroxine sodium) .Marland Kitchen... Take 1 tablet by mouth once a day  Orders: T-TSH ([id number])  Complete Medication List: 1)  Synthroid 50 Mcg Tabs (Levothyroxine sodium) .... Take 1 tablet by mouth once a day 2)  Ortho  Tri-cyclen Lo 0.18/0.215/0.25 Mg-25 Mcg Tabs (Norgestim-eth estrad triphasic) .Marland Kitchen.. 1 tab by mouth daily as directed 3)  Lexapro 10 Mg Tabs (Escitalopram oxalate) .Marland Kitchen.. 1 tab by mouth daily  Patient Instructions: 1)  Stay on Lexapro. 2)  Work on regular exercise to help ith mood. 3)  TSH order for MAY.  Do at the lab downstairs. 4)  Will call you w/ results. 5)  REturn for f/u mood in 4 mos. Prescriptions: LEXAPRO 10 MG TABS (ESCITALOPRAM OXALATE) 1 tab by mouth daily  #90 x 1   Entered and Authorized by:   Seymour Bars DO   Signed by:   Seymour Bars DO on 05/20/2009   Method used:   Electronically to        [facility] (mail-order)             ,          Ph: [phone]       Fax: [phone]   RxID:   [id number]

## 2010-03-24 NOTE — Letter (Signed)
Summary: Out of Work  [facility]  [address], [address]   [place], [place] [address]   Phone: [phone]  Fax: [phone]    April 09, 2009   Employee:  Rachel Mack    To Whom It May Concern:   For Medical reasons, please excuse the above named employee from work for the following dates:  Start:   04/09/09  End:   04/10/09  If you need additional information, please feel free to contact our office.         Sincerely,    Seymour Bars DO

## 2010-03-24 NOTE — Assessment & Plan Note (Signed)
Summary: URI   Vital Signs:  Patient profile:   50 year old female Menstrual status:  regular Height:      64.75 inches Weight:      150 pounds BMI:     25.25 O2 Sat:      98 % on Room air Temp:     98.8 degrees F oral Pulse rate:   82 / minute BP sitting:   131 / 87  (left arm) Cuff size:   regular  Vitals Entered By: Payton Spark CMA (October 28, 2009 12:53 PM)  O2 Flow:  Room air CC: ? Sinus congestion x 5 days.   Primary Care Provider:  Seymour Bars DO  CC:  ? Sinus congestion x 5 days.Marland Kitchen  History of Present Illness: Mrs. Blacketer is a 50 year-old female with a 5 day history of a sinus pressure.  This seems to have started when her grandchildren came to visit her for the weekend.  The patient reports that she started having a sore throat and nasal drainage. Then she began having symptoms of dental pain, peri-ocular pain and pain over the right sinues along with headaches.    The patient also reports that her tempature has not been over 100 since all of this began.    In addition, the patient has had a dry cough for about two days along with some nausea but no vomitting, and no muscle aches and  no shortness of breath.    She has taken about 6 advil per day with very little relief of symptoms.  She took some claritin D  which produced some relief of symptoms, but she stopped it because she began to have palpitations.    Current Medications (verified): 1)  Synthroid 50 Mcg Tabs (Levothyroxine Sodium) .... Take 1 Tablet By Mouth Once A Day 2)  Ortho Tri-Cyclen Lo 0.18/0.215/0.25 Mg-25 Mcg Tabs (Norgestim-Eth Estrad Triphasic) .Marland Kitchen.. 1 Tab By Mouth Daily As Directed 3)  Nasonex 50 Mcg/act Susp (Mometasone Furoate) .... 2 Sprays Per Nostril Daily 4)  Zoloft 50 Mg Tabs (Sertraline Hcl) .... 1/2 Tab By Mouth Daily X 1 Wk Then Increase To 1 Tab By Mouth Daily  Allergies (verified): 1)  ! Wellbutrin  Past History:  Past Medical History: Reviewed history from 09/22/2009 and  no changes required. Hypothyroidism  since 2008 depression Perimenopausal  Past Surgical History: Reviewed history from 04/25/2008 and no changes required. cryosurgery for cervicitis  Social History: Reviewed history from 05/20/2009 and no changes required. Quit smoking 2006. Works out regularly. Married.  4 sons.  Youngest at home. Not exercising.  Review of Systems      See HPI  Physical Exam  General:  alert, well-developed, and well-nourished.   Head:  Reynolds/AT; R maxillary sinus TTP Eyes:  EOMI Ears:  EACs patent; TMs translucent and gray with good cone of light and bony landmarks.  Nose:  no nasal discharge.  deviated septum Mouth:  good dentition, no exudates, no postnasal drip, no lesions. Some pharyngeal erythema seen.  Neck:  supple.  No lymphadenopathy noted Lungs:  Clear to auscultation bilaterally with no crackles, wheezes, rales or rhonchi.  No egophany.  No tactile fremitus Heart:  normal rate, regular rhythm, no murmur, no gallop, and no rub.   Skin:  color normal and no rashes.   Cervical Nodes:  No lymphadenopathy noted   Impression & Recommendations:  Problem # 1:  VIRAL URI (ICD-465.9) Viral URI, day 5. Supportive care with fluids, rest, Advil cold and  sinus, Omnaris nasal spray, samples given. Call if not improving in the next 4 days.  Complete Medication List: 1)  Synthroid 50 Mcg Tabs (Levothyroxine sodium) .... Take 1 tablet by mouth once a day 2)  Ortho Tri-cyclen Lo 0.18/0.215/0.25 Mg-25 Mcg Tabs (Norgestim-eth estrad triphasic) .Marland Kitchen.. 1 tab by mouth daily as directed 3)  Omnaris 50 Mcg/act Susp (Ciclesonide) .... 2 sprays per nostril daily 4)  Zoloft 50 Mg Tabs (Sertraline hcl) .... 1/2 tab by mouth daily x 1 wk then increase to 1 tab by mouth daily  Patient Instructions: 1)  For viral upper respiratory infection: 2)  Use Advil Cold and Sinus as needed for symptom relief. 3)  Add Omnaris (in place of Nasonex) daily -- 2 sprays per nostril. 4)   Call if not improving by FRI AM.

## 2010-03-24 NOTE — Assessment & Plan Note (Signed)
Summary: DEPRESSION   Vital Signs:  Patient profile:   50 year old female Menstrual status:  regular Height:      64.75 inches Weight:      144 pounds BMI:     24.24 O2 Sat:      99 % on Room air Temp:     98.4 degrees F oral Pulse rate:   96 / minute BP sitting:   126 / 86  (right arm) Cuff size:   regular  Vitals Entered By: Payton Spark CMA/April (April 08, 2009 11:26 AM)  O2 Flow:  Room air CC: discuss depression   Primary Care Provider:  Seymour Bars DO  CC:  discuss depression.  History of Present Illness: 50 yo WF presents for problems with wellbutrin.  The generic caused her to have heart palpitations so she stopped it.  She is going thru perimenopause.  She feels no energy, anhedonia and irritable.  She has never taken anything else for  depression.  Unable to exercise due to lack of energy.  Current Medications (verified): 1)  Synthroid 50 Mcg Tabs (Levothyroxine Sodium) .... Take 1 Tablet By Mouth Once A Day 2)  Ortho Tri-Cyclen Lo 0.18/0.215/0.25 Mg-25 Mcg Tabs (Norgestim-Eth Estrad Triphasic) .Marland Kitchen.. 1 Tab By Mouth Daily As Directed 3)  Bupropion Hcl 150 Mg Xr24h-Tab (Bupropion Hcl) .Marland Kitchen.. 1 Tab By Mouth Qam  Allergies (verified): 1)  ! Wellbutrin  Past History:  Past Medical History: Reviewed history from 04/25/2008 and no changes required. Hypothyroidism  since 2008 Perimenopausal  Social History: Reviewed history from 04/25/2008 and no changes required. Quit smoking 2006. Works out regularly. Married.  4 sons.  Youngest at home.  Review of Systems Psych:  Complains of depression, easily angered, easily tearful, and irritability; denies anxiety, panic attacks, suicidal thoughts/plans, thoughts of violence, unusual visions or sounds, and thoughts /plans of harming others.  Physical Exam  General:  alert, well-developed, well-nourished, and well-hydrated.   Psych:  good eye contact, not anxious appearing, and depressed affect.     Impression &  Recommendations:  Problem # 1:  DEPRESSION, MILD (ICD-311) PHQ-9 score of :15  c/w moderate to severe depression w/o suidical ideation.  Husband supportive. Will start her on Lexapro 10 mg once daily.  RTC in 6 wks for f/u.  Call if any problems. Will still need to address her vasomotor symptoms.    Complete Medication List: 1)  Synthroid 50 Mcg Tabs (Levothyroxine sodium) .... Take 1 tablet by mouth once a day 2)  Ortho Tri-cyclen Lo 0.18/0.215/0.25 Mg-25 Mcg Tabs (Norgestim-eth estrad triphasic) .Marland Kitchen.. 1 tab by mouth daily as directed 3)  Lexapro 10 Mg Tabs (Escitalopram oxalate) .Marland Kitchen.. 1 tab by mouth daily  Patient Instructions: 1)  Start Lexapro 10 mg daily for mood. 2)  f/u in 6 wks. Prescriptions: LEXAPRO 10 MG TABS (ESCITALOPRAM OXALATE) 1 tab by mouth daily  #30 x 1   Entered and Authorized by:   Seymour Bars DO   Signed by:   Seymour Bars DO on 04/08/2009   Method used:   Electronically to        [facility] [phone]* (retail)       [address] [place], [place]  [address]       Ph: [phone]       Fax: [phone]   RxID:   [id number]

## 2010-03-24 NOTE — Letter (Signed)
Summary: Out of Work  [facility]  [address], [address]   [place], [place] [address]   Phone: [phone]  Fax: [phone]    April 08, 2009   Employee:  Elvis L Hew    To Whom It May Concern:   For Medical reasons, please excuse the above named employee from work for the following dates:  Start:   04/08/09  End:   04/09/09  If you need additional information, please feel free to contact our office.         Sincerely,    Seymour Bars DO

## 2010-03-24 NOTE — Letter (Signed)
Summary: Out of Work  [facility]  [address]   [place], [place] [address]   Phone: [phone]  Fax: [phone]    June 19, 2009   Employee:  Auria L Atha    To Whom It May Concern:   For Medical reasons, please excuse the above named employee from work for the following dates:  Start:   06-19-2009  End:   06-24-2009  If you need additional information, please feel free to contact our office.         Sincerely,    Nani Gasser MD

## 2010-03-24 NOTE — Progress Notes (Signed)
Summary: med  Phone Note Call from Patient   Caller: Patient Call For: Dr. Linford Arnold Summary of Call: Pt called and states the Wellbutrin was not covered by insurance.Pt did not get it and wants to take otc soy supplement to help with menopause and hot flashes.I told pt that was ok Initial call taken by: Avon Gully CMA, Duncan Dull),  October 17, 2009 4:26 PM

## 2010-03-26 NOTE — Assessment & Plan Note (Signed)
Summary: CPE with pap   Vital Signs:  Patient profile:   50 year old female Menstrual status:  regular Height:      64.75 inches Weight:      150 pounds BMI:     25.25 O2 Sat:      96 % on Room air Pulse rate:   89 / minute BP sitting:   130 / 90  (left arm) Cuff size:   regular  Vitals Entered By: Payton Spark CMA (February 10, 2010 11:11 AM)  O2 Flow:  Room air CC: CPE w/ pap   Primary Care Provider:  Seymour Bars DO  CC:  CPE w/ pap.  History of Present Illness: 50 yo WF presents for CPE with pap smear.  She started to have a sore throat and congestion x 2 days. with L ear pain.  She is due for fasting labs today.  Her mammogram was updated in August. She is being treated for a metatarsal stress fx with Dr Yates Decamp - in a Cam walker.  She refused to take Zoloft for her depression and wants to work on more exercise (after out of the cam walker) for stress reduction and improvement of body image.  She denies a fam hx of colon cancer or premature heart dz.   She c/o R flank pain.  Her period is due tomorrow.        Habits & Providers  Alcohol-Tobacco-Diet     Tobacco Status: quit  Current Medications (verified): 1)  Synthroid 50 Mcg Tabs (Levothyroxine Sodium) .... Take 1 Tablet By Mouth Once A Day 2)  Ortho Tri-Cyclen Lo 0.18/0.215/0.25 Mg-25 Mcg Tabs (Norgestim-Eth Estrad Triphasic) .Marland Kitchen.. 1 Tab By Mouth Daily As Directed 3)  Omnaris 50 Mcg/act Susp (Ciclesonide) .... 2 Sprays Per Nostril Daily 4)  Zoloft 50 Mg Tabs (Sertraline Hcl) .... 1/2 Tab By Mouth Daily X 1 Wk Then Increase To 1 Tab By Mouth Daily  Allergies (verified): 1)  ! Wellbutrin 2)  Amoxicillin  Past History:  Past Medical History: Hypothyroidism  since 2008 depression Perimenopausal B12 def.  Past Surgical History: Reviewed history from 04/25/2008 and no changes required. cryosurgery for cervicitis  Family History: Reviewed history from 12/26/2008 and no changes required. mother died  of breast cancer at 71, diagnosed at 45.  HTN, high chol, DM sister died of drug OD, suicide. sister alive, healthy  3 brothers healthy father healthy GM colon cancer at 107.  Social History: Reviewed history from 05/20/2009 and no changes required. Quit smoking 2006. Works out regularly. Married.  4 sons.  Youngest at home. Not exercising.  Review of Systems  The patient denies anorexia, fever, weight loss, weight gain, vision loss, decreased hearing, hoarseness, chest pain, syncope, dyspnea on exertion, peripheral edema, prolonged cough, headaches, hemoptysis, abdominal pain, melena, hematochezia, severe indigestion/heartburn, hematuria, incontinence, genital sores, muscle weakness, suspicious skin lesions, transient blindness, difficulty walking, depression, unusual weight change, abnormal bleeding, enlarged lymph nodes, angioedema, breast masses, and testicular masses.    Physical Exam  General:  alert, well-developed, well-nourished, and well-hydrated.   Head:  normocephalic and atraumatic.   Eyes:  pupils equal, pupils round, and pupils reactive to light.   Ears:  EACs patent; TMs translucent and gray with good cone of light and bony landmarks.  Nose:  boggy turbinates with nasal congestion Mouth:  good dentition and pharynx pink and moist.   Neck:  no masses.   Breasts:  No mass, nodules, thickening, tenderness, bulging, retraction, inflamation, nipple discharge or skin  changes noted.   Lungs:  Normal respiratory effort, chest expands symmetrically. Lungs are clear to auscultation, no crackles or wheezes. Heart:  Normal rate and regular rhythm. S1 and S2 normal without gallop, murmur, click, rub or other extra sounds. Abdomen:  soft, non-tender, normal bowel sounds, no distention, no masses, no guarding, no hepatomegaly, and no splenomegaly.   Genitalia:  Pelvic Exam:        External: normal female genitalia without lesions or masses        Vagina: normal without lesions or  masses        Cervix: normal without lesions or masses        Adnexa: normal bimanual exam without masses or fullness        Uterus: normal by palpation        Pap smear: performed Pulses:  2+ radial and pedal pulses Extremities:  no LE edema Skin:  color normal.   Cervical Nodes:  No lymphadenopathy noted   Impression & Recommendations:  Problem # 1:  ROUTINE GYNECOLOGICAL EXAMINATION (ICD-V72.31) Keeping healthy checklist for women reviewed. BP at the ULN.  Will continue to monitor. BMI 25 OK.  Working on diet, exercise, wt loss. Continue Calcium + D and MVI daily. Immunizations are UTD. Fasting labs today. UA normal (+ blood -- due for period tomorrow). Thin prep pap done today.    Problem # 2:  VIRAL URI (ICD-465.9) Day 2 viral URI with normal exam findings.   I gave her a list of supportive care measures.  Since it is Xmas w/e and we are closed Monday, I printed RX for Zithromax to fill this w/e only IF not feeling better by then.  Pt agrees with this plan.  Complete Medication List: 1)  Synthroid 50 Mcg Tabs (Levothyroxine sodium) .... Take 1 tablet by mouth once a day 2)  Ortho Tri-cyclen Lo 0.18/0.215/0.25 Mg-25 Mcg Tabs (Norgestim-eth estrad triphasic) .Marland Kitchen.. 1 tab by mouth daily as directed 3)  Omnaris 50 Mcg/act Susp (Ciclesonide) .... 2 sprays per nostril daily 4)  Zithromax Z-pak 250 Mg Tabs (Azithromycin) .... 2 tabs by mouth x 1 day then 1 tab by mouth daily x 4 days  Other Orders: T-CBC w/Diff ([id number]) T-Comprehensive Metabolic Panel (18841-66063) T-Lipid Profile (01601-09323) T-Vitamin B12 (55732-20254) UA Dipstick w/o Micro (automated)  (81003)  Patient Instructions: 1)  Use OTC Delsym for cough. 2)  Use OTC Tyelnol for aches/ pains with Afrin nasal spray for stuffy nose x 4 days. 3)  If not starting to improve by Sat morning, fill RX for antibiotics. 4)  Return for f/u BP/ mood/ thyroid in 2 mos. Prescriptions: ZITHROMAX Z-PAK 250 MG TABS  (AZITHROMYCIN) 2 tabs by mouth x 1 day then 1 tab by mouth daily x 4 days  #1 pack x 0   Entered and Authorized by:   Seymour Bars DO   Signed by:   Seymour Bars DO on 02/10/2010   Method used:   Printed then faxed to ...       [facility] [phone]* (retail)       [address] [place], [place]  [address]       Ph: [phone]       Fax: [phone]   RxID:   [id number]    Orders Added: 1)  T-CBC w/Diff [[id number]] 2)  T-Comprehensive Metabolic Panel [[id number]] 3)  T-Lipid Profile [80061-22930] 4)  T-Vitamin B12 [82607-23330] 5)  UA Dipstick w/o Micro (  automated)  [81003] 6)  Est. Patient age 43-64 [54] 7)  Est. Patient Level II [99212]    Laboratory Results   Urine Tests    Routine Urinalysis   Color: yellow Appearance: Clear Glucose: negative   (Normal Range: Negative) Bilirubin: negative   (Normal Range: Negative) Ketone: negative   (Normal Range: Negative) Spec. Gravity: 1.015   (Normal Range: 1.003-1.035) Blood: large   (Normal Range: Negative) pH: 5.5   (Normal Range: 5.0-8.0) Protein: negative   (Normal Range: Negative) Urobilinogen: 0.2   (Normal Range: 0-1) Nitrite: negative   (Normal Range: Negative) Leukocyte Esterace: trace   (Normal Range: Negative)

## 2010-03-26 NOTE — Progress Notes (Signed)
Summary: lexapro  Phone Note Call from Patient   Caller: Patient Call For: Rachel Bars DO Summary of Call: pt states she would like to restart her lexapro because she has been feeling "worried" lately. Pt states she was on it before and she felt like it helped her. Initial call taken by: Avon Gully CMA, Duncan Dull),  February 26, 2010 9:28 AM  Follow-up for Phone Call        pt needs OV. antidepressants are not called in once stopped for an extended period of time. Follow-up by: Rachel Bars DO,  February 26, 2010 9:44 AM     Appended Document: lexapro 02/26/09 acm 11:32 pt notifed

## 2010-03-26 NOTE — Miscellaneous (Signed)
Summary: pap smear  Clinical Lists Changes  Observations: Added new observation of PAP DUE: 02/2011 (02/12/2010 12:32) Added new observation of PAPRECACT: PAP smear done (02/12/2010 12:32) Added new observation of PMH THYRO DX: yes (02/12/2010 12:32) Added new observation of CHIEF CMPLNT: Preventive Care (02/12/2010 12:32) Added new observation of PAP SMEAR: Normal, Unsatisfactory (02/10/2010 12:33)      PAP Screening:    Last PAP smear:  02/10/2010    Reviewed PAP smear recommendations:  PAP smear done  PAP Smear Results:    Date of Exam:  02/10/2010    Results:  Normal, Unsatisfactory  PAP Smear Comments:    transformation zone absent (difficult os on exam)  Next PAP Due:    02/11/2011  Mammogram Screening:    Last Mammogram:  10/21/2009  Osteoporosis Risk Assessment:  Risk Factors for Fracture or Low Bone Density:   Race (White or Asian):     yes   Smoking status:       quit   Thyroid replacement:     yes   Thyroid disease:     yes  Immunization & Chemoprophylaxis:    Tetanus vaccine: Tdap  (12/26/2008)  Appended Document: pap smear Pls let pt know that her pap was normal.  She does need to repeat in 1 yr.  Seymour Bars, D.O.  Appended Document: pap smear Pt aware of the above

## 2010-03-26 NOTE — Assessment & Plan Note (Signed)
Summary: B12 injection  Nurse Visit   Allergies: 1)  ! Wellbutrin 2)  Amoxicillin  Medication Administration  Injection # 1:    Medication: Vit B12 1000 mcg    Diagnosis: B12 DEFICIENCY (ICD-266.2)    Route: IM    Site: L deltoid    Exp Date: 11/23/2010    Lot #: [id number]    Mfr: [employer] Regent    Patient tolerated injection without complications    Given by: Avon Gully CMA, Duncan Dull) (February 25, 2010 3:27 PM)  Orders Added: 1)  Vit B12 1000 mcg [J3420] 2)  Admin of Injection (IM/SQ) [16109]

## 2010-03-26 NOTE — Assessment & Plan Note (Signed)
Summary: Sinusitis   Vital Signs:  Patient profile:   50 year old female Menstrual status:  regular Height:      64.75 inches Pulse rate:   87 / minute BP sitting:   137 / 87  (right arm) Cuff size:   regular  Vitals Entered By: Avon Gully CMA, Duncan Dull) (March 20, 2010 3:34 PM) CC: sinus congestion, x 2 days   Primary Care Provider:  Seymour Bars DO  CC:  sinus congestion and x 2 days.  History of Present Illness: sinus congestion, x 2 days.  SEvere congestion and post nasal drip. ST. No fever.  MAxillary pain and pressure. . no ear pain. Some diarrhea.  Feels very drainaged and fatigued.  No med for it. No sinus surgery. non smoker.No worsening or alleviating. sxs.   Current Medications (verified): 1)  Synthroid 50 Mcg Tabs (Levothyroxine Sodium) .... Take 1 Tablet By Mouth Once A Day 2)  Ortho Tri-Cyclen Lo 0.18/0.215/0.25 Mg-25 Mcg Tabs (Norgestim-Eth Estrad Triphasic) .Marland Kitchen.. 1 Tab By Mouth Daily As Directed  Allergies (verified): 1)  ! Wellbutrin 2)  Amoxicillin  Comments:  Nurse/Medical Assistant: The patient's medications and allergies were reviewed with the patient and were updated in the Medication and Allergy Lists. Avon Gully CMA, Duncan Dull) (March 20, 2010 3:37 PM)  Past History:  Past Medical History: Last updated: 02/10/2010 Hypothyroidism  since 2008 depression Perimenopausal B12 def.  Past Surgical History: Last updated: 04/25/2008 cryosurgery for cervicitis  Physical Exam  General:  Well-developed,well-nourished,in no acute distress; alert,appropriate and cooperative throughout examination Head:  Normocephalic and atraumatic without obvious abnormalities. No apparent alopecia or balding. Eyes:  No corneal or conjunctival inflammation noted. EOMI. Perrla. Ears:  External ear exam shows no significant lesions or deformities.  Otoscopic examination reveals clear canals, tympanic membranes are intact bilaterally without bulging, retraction,  inflammation or discharge. Hearing is grossly normal bilaterally. Nose:  External nasal examination shows no deformity or inflammation.  Mouth:  Oral mucosa and oropharynx without lesions or exudates.  Teeth in good repair. Neck:  No deformities, masses, or tenderness noted. Lungs:  Normal respiratory effort, chest expands symmetrically. Lungs are clear to auscultation, no crackles or wheezes. Heart:  Normal rate and regular rhythm. S1 and S2 normal without gallop, murmur, click, rub or other extra sounds. Skin:  no rashes.   Cervical Nodes:  No lymphadenopathy noted Psych:  Cognition and judgment appear intact. Alert and cooperative with normal attention span and concentration. No apparent delusions, illusions, hallucinations   Impression & Recommendations:  Problem # 1:  SINUSITIS - ACUTE-NOS (ICD-461.9) symptmatic care.  The following medications were removed from the medication list:    Omnaris 50 Mcg/act Susp (Ciclesonide) .Marland Kitchen... 2 sprays per nostril daily    Zithromax Z-pak 250 Mg Tabs (Azithromycin) .Marland Kitchen... 2 tabs by mouth x 1 day then 1 tab by mouth daily x 4 days  Instructed on treatment. Call if symptoms persist or worsen.   Complete Medication List: 1)  Synthroid 50 Mcg Tabs (Levothyroxine sodium) .... Take 1 tablet by mouth once a day 2)  Ortho Tri-cyclen Lo 0.18/0.215/0.25 Mg-25 Mcg Tabs (Norgestim-eth estrad triphasic) .Marland Kitchen.. 1 tab by mouth daily as directed 3)  Fish Oil Double Strength 1200 Mg Caps (Omega-3 fatty acids) .... Take 1 tablet by mouth three times a day  Patient Instructions: 1)  Call if not better in 7-10 days or if suddenly get worse.  2)  Continue the nasal saline    Orders Added: 1)  Est. Patient Level  III [id number]

## 2010-05-01 ENCOUNTER — Ambulatory Visit: Payer: Self-pay | Admitting: Family Medicine

## 2010-05-11 ENCOUNTER — Telehealth: Payer: Self-pay | Admitting: Family Medicine

## 2010-05-21 NOTE — Progress Notes (Signed)
Summary: When to start new pack of OCPs  Phone Note Call from Patient   Caller: Patient Summary of Call: Pt has been on OrthoTriCyclen and did not start period during the placebo pills, Pt is supposed to start new pack of pills today but she is not sure if she should wait until next Sunday to start new pack since she just started period. Please advise. Initial call taken by: Payton Spark CMA,  May 11, 2010 9:25 AM  Follow-up for Phone Call        wait 3 days, then start new pill back even if period has not ended. Follow-up by: Seymour Bars DO,  May 11, 2010 10:03 AM     Appended Document: When to start new pack of OCPs LMOM informing Pt of the above

## 2010-06-01 ENCOUNTER — Other Ambulatory Visit: Payer: Self-pay | Admitting: Family Medicine

## 2010-06-04 ENCOUNTER — Telehealth: Payer: Self-pay | Admitting: Family Medicine

## 2010-06-04 NOTE — Telephone Encounter (Signed)
PLEASE NOTE THAT PATIENT IS TRANSFERING CARE TO [facility].

## 2010-07-22 ENCOUNTER — Other Ambulatory Visit: Payer: Self-pay | Admitting: *Deleted

## 2010-07-22 MED ORDER — SYNTHROID 50 MCG PO TABS: 50.0000 ug | ORAL_TABLET | Freq: Every day | ORAL | Status: DC

## 2010-08-14 ENCOUNTER — Other Ambulatory Visit: Payer: Self-pay | Admitting: *Deleted

## 2010-08-14 MED ORDER — NORGESTIM-ETH ESTRAD TRIPHASIC 0.18/0.215/0.25 MG-25 MCG PO TABS: 1.0000 | ORAL_TABLET | Freq: Every day | ORAL | Status: DC

## 2010-08-31 ENCOUNTER — Telehealth: Payer: Self-pay | Admitting: Family Medicine

## 2010-08-31 NOTE — Telephone Encounter (Signed)
LMOM informing Pt of the above 

## 2010-08-31 NOTE — Telephone Encounter (Signed)
Patient called request a order form for mammogram. Advised pt last mm was 10/21/09 in emr..Patient request a call on home phone.

## 2010-09-16 ENCOUNTER — Encounter: Payer: Self-pay | Admitting: Family Medicine

## 2010-09-16 ENCOUNTER — Ambulatory Visit (INDEPENDENT_AMBULATORY_CARE_PROVIDER_SITE_OTHER): Payer: Managed Care, Other (non HMO) | Admitting: Family Medicine

## 2010-09-16 DIAGNOSIS — Z780 Asymptomatic menopausal state: Secondary | ICD-10-CM

## 2010-09-16 DIAGNOSIS — I10 Essential (primary) hypertension: Secondary | ICD-10-CM

## 2010-09-16 DIAGNOSIS — E039 Hypothyroidism, unspecified: Secondary | ICD-10-CM

## 2010-09-16 DIAGNOSIS — E538 Deficiency of other specified B group vitamins: Secondary | ICD-10-CM

## 2010-09-16 HISTORY — DX: Essential (primary) hypertension: I10

## 2010-09-16 MED ORDER — HYDROCHLOROTHIAZIDE 12.5 MG PO CAPS: 12.5000 mg | ORAL_CAPSULE | ORAL | Status: DC

## 2010-09-16 MED ORDER — CYANOCOBALAMIN 1000 MCG/ML IJ SOLN: 1000.0000 ug | INTRAMUSCULAR | Status: DC

## 2010-09-16 NOTE — Assessment & Plan Note (Signed)
Symptoms much improved on low dose OCPs.  Will continue and will need to address her elevated BP with HCTZ 12.5 mg/ day.  RTC for f/u BP in 3 mos.

## 2010-09-16 NOTE — Progress Notes (Signed)
  Subjective:    Patient ID: Rachel Mack, female    DOB: 12/14/1960, 50 y.o.   MRN: [id number]  HPI  50 yo WF presents for perimenopausal symptoms that have improved back on OCPs.  She went 1 month w/o them and the hot flashes and nightsweats did return off the pill.  She has been having a normal period on the pill.  Her BP has been running a little high but she's not checking her readings at home.  She is due for her period in a week.  She has not continued with B12 injections each month and would like to self administer at home.    She has not been on BP meds.  Has been drinking a lot of sweet tea lately.  BP 136/93  Pulse 78  Ht 5' 4.5" (1.638 m)  Wt 153 lb (69.4 kg)  BMI 25.86 kg/m2  SpO2 100%  LMP 07/24/2010   Review of Systems  Constitutional: Negative for fatigue.  Respiratory: Negative for shortness of breath.   Cardiovascular: Negative for chest pain, palpitations and leg swelling.  Neurological: Negative for headaches.  Psychiatric/Behavioral: Negative for dysphoric mood.       Objective:   Physical Exam  Constitutional: She appears well-developed.  HENT:  Mouth/Throat: Oropharynx is clear and moist.  Neck: Neck supple.  Cardiovascular: Normal rate, regular rhythm and normal heart sounds.   No murmur heard. Pulmonary/Chest: Effort normal and breath sounds normal.  Musculoskeletal: She exhibits no edema.  Psychiatric: She has a normal mood and affect.          Assessment & Plan:

## 2010-09-16 NOTE — Patient Instructions (Signed)
Start HCTZ 12.5 mg everyday for high BP.  Cut back on caffeine and sugar intake.  Start monthly B12 injections.  Take OTC MVI daily and stay on birth control pills.  Return for f/u BP/ B12 deficiency in 3 mos.

## 2010-09-16 NOTE — Assessment & Plan Note (Signed)
DBP has been consistently > 90 and she's on OCPs.  Will start HCTZ 12.5 mg/day.  Labs are UTD.  Work on reducing intake of caffeiene and sugar.

## 2010-09-16 NOTE — Assessment & Plan Note (Signed)
B12 injection given today.  RX to self administer injections (she is comfortable doing this) given and sent to [facility].  Check level in 2 mos with TSH.

## 2010-09-16 NOTE — Assessment & Plan Note (Signed)
Check TSH with labs in 2 mos.  It will be a year in Aug.

## 2010-10-20 ENCOUNTER — Other Ambulatory Visit: Payer: Self-pay | Admitting: Family Medicine

## 2010-10-20 DIAGNOSIS — Z1231 Encounter for screening mammogram for malignant neoplasm of breast: Secondary | ICD-10-CM

## 2010-10-27 ENCOUNTER — Ambulatory Visit
Admission: RE | Admit: 2010-10-27 | Discharge: 2010-10-27 | Disposition: A | Discharge status: Home / Self Care | Payer: Managed Care, Other (non HMO) | Source: Ambulatory Visit | Attending: Family Medicine | Admitting: Family Medicine

## 2010-10-27 DIAGNOSIS — Z1231 Encounter for screening mammogram for malignant neoplasm of breast: Secondary | ICD-10-CM

## 2010-10-28 ENCOUNTER — Telehealth: Payer: Self-pay | Admitting: Family Medicine

## 2010-10-28 NOTE — Telephone Encounter (Signed)
Please call patient. Normal mammogram.  Repeat in 1 year.  

## 2010-10-28 NOTE — Telephone Encounter (Signed)
Pt aware.

## 2010-11-16 ENCOUNTER — Other Ambulatory Visit: Payer: Self-pay | Admitting: *Deleted

## 2010-11-16 DIAGNOSIS — E039 Hypothyroidism, unspecified: Secondary | ICD-10-CM

## 2010-11-17 ENCOUNTER — Other Ambulatory Visit: Payer: Self-pay | Admitting: *Deleted

## 2010-11-17 ENCOUNTER — Telehealth: Payer: Self-pay | Admitting: *Deleted

## 2010-11-17 DIAGNOSIS — E039 Hypothyroidism, unspecified: Secondary | ICD-10-CM

## 2010-11-17 NOTE — Telephone Encounter (Signed)
Needs labs order for TSH

## 2010-11-18 ENCOUNTER — Telehealth: Payer: Self-pay | Admitting: *Deleted

## 2010-11-18 LAB — TSH: TSH: 1.433 u[IU]/mL (ref 0.350–4.500)

## 2010-11-18 NOTE — Telephone Encounter (Signed)
Pt notified of results. KJ LPN 

## 2010-11-18 NOTE — Telephone Encounter (Signed)
Message copied by Lanae Crumbly on Wed Nov 18, 2010  8:58 AM ------      Message from: Nani Gasser D      Created: Wed Nov 18, 2010  7:55 AM       Thyroid looks good.  Let us know if needs refills.

## 2010-12-12 ENCOUNTER — Encounter: Payer: Self-pay | Admitting: Family Medicine

## 2010-12-14 ENCOUNTER — Other Ambulatory Visit: Payer: Self-pay | Admitting: Family Medicine

## 2010-12-14 MED ORDER — SYNTHROID 50 MCG PO TABS: 50.0000 ug | ORAL_TABLET | Freq: Every day | ORAL | Status: DC

## 2010-12-14 NOTE — Telephone Encounter (Signed)
Pt needs a refill on her synthroid sent to [facility] mail order. Plan:  Pt had recent labs that were normal.  Refilled synthroid #90/1 refill, and sent electronically to [facility] Mail order. Jarvis Newcomer, LPN Domingo Dimes

## 2010-12-18 ENCOUNTER — Ambulatory Visit: Payer: Managed Care, Other (non HMO) | Admitting: Family Medicine

## 2010-12-25 ENCOUNTER — Ambulatory Visit (INDEPENDENT_AMBULATORY_CARE_PROVIDER_SITE_OTHER): Payer: Managed Care, Other (non HMO) | Admitting: Family Medicine

## 2010-12-25 ENCOUNTER — Encounter: Payer: Self-pay | Admitting: Family Medicine

## 2010-12-25 VITALS — BP 131/88 | HR 85 | Temp 98.6°F | Wt 146.0 lb

## 2010-12-25 DIAGNOSIS — N390 Urinary tract infection, site not specified: Secondary | ICD-10-CM

## 2010-12-25 DIAGNOSIS — I10 Essential (primary) hypertension: Secondary | ICD-10-CM

## 2010-12-25 DIAGNOSIS — D510 Vitamin B12 deficiency anemia due to intrinsic factor deficiency: Secondary | ICD-10-CM

## 2010-12-25 LAB — POCT URINALYSIS DIPSTICK
Ketones, UA: NEGATIVE
Protein, UA: NEGATIVE
Spec Grav, UA: 1.02
Urobilinogen, UA: 0.2
pH, UA: 5.5

## 2010-12-25 MED ORDER — SULFAMETHOXAZOLE-TRIMETHOPRIM 800-160 MG PO TABS: 1.0000 | ORAL_TABLET | Freq: Two times a day (BID) | ORAL | Status: DC

## 2010-12-25 NOTE — Progress Notes (Signed)
  Subjective:    Patient ID: Rachel Mack, female    DOB: 05-24-1960, 50 y.o.   MRN: [id number]  HPI Urinary sxs for 7 days.  Having some back discomfort. No dysuria or frequency. Dec urination.  No fever or chills. Hx of kidney stones.    HTN- she stopped her cafefine.  She has also lost some weight purposefully.  BPs at home  In the 120s when she is relaxed.   Review of Systems     Objective:   Physical Exam  Constitutional: She is oriented to person, place, and time. She appears well-developed and well-nourished.  HENT:  Head: Normocephalic and atraumatic.  Abdominal:       Bladder tenderness.   Musculoskeletal:       No CVA tenderness.   Neurological: She is alert and oriented to person, place, and time.  Skin: Skin is warm and dry.  Psychiatric: She has a normal mood and affect. Her behavior is normal.          Assessment & Plan:  UTI - will tx. If not better consider could be a kidney stone. If pain worsens or becomes severe the call. Can use an NSAID if needed for short period of time. May raise BP.   HTN - I am happy with the improvement.  Continue current regimen. F/U in 6 mo.   B12  Def - she has been doing home shots. Due to recheck level before next injection.

## 2010-12-28 ENCOUNTER — Telehealth: Payer: Self-pay | Admitting: *Deleted

## 2010-12-28 NOTE — Telephone Encounter (Signed)
Pt called and wanted to know what cause bleeding after sex. Advise pt it could be a number of things from a bacterial infection, to polyps in the cervix to being dry and needing lubricant but patient needed to be eval.Pt declined stating she cant come in until next month. Advised pt to call if sxs persisted or got worse of new sxs fever,discharge,abd pain ect to call back

## 2010-12-29 ENCOUNTER — Telehealth: Payer: Self-pay | Admitting: *Deleted

## 2010-12-29 NOTE — Telephone Encounter (Signed)
Patient called and states she is now having a creamy discharge and wanted to know if we could call in something. Called and notified pt that we could not call in any medication as we have not evaluated her and we would not know what to call in. Advised pt that since per previous convo that she needs appt and especially since she is having new sxs. Pt did not want to come for a visit yet due to financial reason. offerered at least nurse visit so we can at least do a wet prep. Pt agreed.transferred to schedule nurse visit

## 2010-12-30 ENCOUNTER — Ambulatory Visit (INDEPENDENT_AMBULATORY_CARE_PROVIDER_SITE_OTHER): Payer: Managed Care, Other (non HMO) | Admitting: Family Medicine

## 2010-12-30 DIAGNOSIS — R300 Dysuria: Secondary | ICD-10-CM

## 2010-12-30 DIAGNOSIS — N760 Acute vaginitis: Secondary | ICD-10-CM

## 2010-12-30 LAB — POCT URINALYSIS DIPSTICK
Bilirubin, UA: NEGATIVE
Spec Grav, UA: 1.025
pH, UA: 6

## 2010-12-30 LAB — WET PREP FOR TRICH, YEAST, CLUE
Trich, Wet Prep: NONE SEEN
Yeast Wet Prep HPF POC: NONE SEEN

## 2010-12-30 MED ORDER — METRONIDAZOLE 500 MG PO TABS: 500.0000 mg | ORAL_TABLET | Freq: Two times a day (BID) | ORAL | Status: DC

## 2010-12-30 NOTE — Progress Notes (Signed)
  Subjective:    Patient ID: Rachel Mack, female    DOB: 1960/09/02, 50 y.o.   MRN: [id number]  HPI Pt c/o burning and itching in vaginal area. Also c/o thicker vaginal discharge. She did complete 3 day ABX recently which helped but Sxs are back. Pt also has Hx of kidney stones but Sxs are different    Review of Systems     Objective:   Physical Exam        Assessment & Plan:  History-urinalysis was positive for blood and leukocytes. We will send for culture.  Vaginitis-her wet prep was positive for clue cells. We will treat with metronidazole for bacterial vaginitis.

## 2011-01-01 ENCOUNTER — Other Ambulatory Visit: Payer: Self-pay | Admitting: Family Medicine

## 2011-01-01 ENCOUNTER — Telehealth: Payer: Self-pay | Admitting: *Deleted

## 2011-01-01 DIAGNOSIS — N200 Calculus of kidney: Secondary | ICD-10-CM

## 2011-01-01 DIAGNOSIS — M549 Dorsalgia, unspecified: Secondary | ICD-10-CM

## 2011-01-01 DIAGNOSIS — Z87442 Personal history of urinary calculi: Secondary | ICD-10-CM

## 2011-01-01 NOTE — Telephone Encounter (Signed)
CT abd pelvis order needed for GIK

## 2011-01-04 ENCOUNTER — Ambulatory Visit
Admission: RE | Admit: 2011-01-04 | Discharge: 2011-01-04 | Disposition: A | Discharge status: Home / Self Care | Payer: Managed Care, Other (non HMO) | Source: Ambulatory Visit | Attending: Family Medicine | Admitting: Family Medicine

## 2011-01-04 ENCOUNTER — Other Ambulatory Visit: Payer: Self-pay | Admitting: Family Medicine

## 2011-01-04 DIAGNOSIS — I313 Pericardial effusion (noninflammatory): Secondary | ICD-10-CM

## 2011-01-07 ENCOUNTER — Telehealth: Payer: Self-pay | Admitting: Cardiology

## 2011-01-07 NOTE — Telephone Encounter (Signed)
Spoke with pt, appt rescheduled to tomorrow Rachel Mack

## 2011-01-07 NOTE — Telephone Encounter (Signed)
Pt has appt 11-28, wants sooner appt, she said all this was freaking her out, I asked if her symptoms are worse and she said they were, asked for how long and she said 2 weeks, we made this a[ppt two days ago, saod back pain worse and blood in urine worse

## 2011-01-08 ENCOUNTER — Ambulatory Visit (INDEPENDENT_AMBULATORY_CARE_PROVIDER_SITE_OTHER): Payer: Managed Care, Other (non HMO) | Admitting: Cardiology

## 2011-01-08 ENCOUNTER — Encounter: Payer: Self-pay | Admitting: Cardiology

## 2011-01-08 VITALS — BP 133/98 | HR 101 | Ht 65.0 in | Wt 147.1 lb

## 2011-01-08 DIAGNOSIS — I313 Pericardial effusion (noninflammatory): Secondary | ICD-10-CM

## 2011-01-08 DIAGNOSIS — I3139 Other pericardial effusion (noninflammatory): Secondary | ICD-10-CM

## 2011-01-08 DIAGNOSIS — I319 Disease of pericardium, unspecified: Secondary | ICD-10-CM

## 2011-01-08 DIAGNOSIS — I10 Essential (primary) hypertension: Secondary | ICD-10-CM

## 2011-01-08 HISTORY — DX: Pericardial effusion (noninflammatory): I31.3

## 2011-01-08 HISTORY — DX: Other pericardial effusion (noninflammatory): I31.39

## 2011-01-08 NOTE — Assessment & Plan Note (Signed)
Management per primary care. 

## 2011-01-08 NOTE — Progress Notes (Signed)
HPI: 50 yo female for evaluation of pericardial effusion. Patient recently complaining of low back pain. She also had microscopic hematuria. A CT of her abdomen was ordered and performed on December 30, 2010. This revealed a small nonobstructing right lower renal pole calculus. There was a small amount of pericardial fluid noted. We were therefore asked to evaluate. Patient denies dyspnea on exertion, orthopnea, PND, pedal edema, palpitations, syncope or exertional chest pain. She occasionally has mild pain in her chest with deep inspiration but she states she has had this for years. She had a TSH checked in September of 2012 that was normal. She denies fever, chills, productive cough, weight loss or change in appetite.  Current Outpatient Prescriptions  Medication Sig Dispense Refill  . cyanocobalamin (,VITAMIN B-12,) 1000 MCG/ML injection Inject 1 mL (1,000 mcg total) into the muscle every 30 (thirty) days.  3 mL  3  . hydrochlorothiazide (,MICROZIDE/HYDRODIURIL,) 12.5 MG capsule Take 1 capsule (12.5 mg total) by mouth every morning.  30 capsule  2  . Norgestim-Eth Estrad Triphasic (ORTHO TRI-CYCLEN LO) 0.18/0.215/0.25 MG-25 MCG TABS Take 1 tablet by mouth daily.  84 tablet  2  . SYNTHROID 50 MCG tablet Take 1 tablet (50 mcg total) by mouth daily. Brand Medically Neccessary  90 tablet  1    Allergies  Allergen Reactions  . Amoxicillin     REACTION: yeast infection  . Bupropion Hcl     REACTION: heart palpitations    Past Medical History  Diagnosis Date  . Thyroid disease   . Depression   . Perimenopausal   . Vitamin B 12 deficiency   . Hypertension   . Nephrolithiasis     Past Surgical History  Procedure Date  . Gynecologic cryosurgery     cervicitis    History   Social History  . Marital Status: Married    Spouse Name: N/A    Number of Children: 4  . 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 Smoking status: Former  Smoker    Quit date: 02/23/2004  . Smokeless tobacco: Not on file  . Alcohol Use: No  . Drug Use: No  . Sexually Active: Not on file     Works out regularly, married, 4 sons, youngest .   Other Topics Concern  . Not on file   Social History Narrative  . No narrative on file    Family History  Problem Relation Age of Onset  . Cancer Mother   . Diabetes Mother   . Hyperlipidemia Mother   . Hypertension Mother   . Drug abuse Sister     died overdose/suicide  . Colon cancer      ROS: low back pain but no fevers or chills, productive cough, hemoptysis, dysphasia, odynophagia, melena, hematochezia, dysuria, hematuria, rash, seizure activity, orthopnea, PND, pedal edema, claudication. Remaining systems are negative.  Physical Exam:  Blood pressure 133/98, pulse 101, height 5\' 5"  (1.651 m), weight 147 lb 1.9 oz (66.733 kg), last menstrual period 12/21/2010.  General:  Well developed/well nourished in NAD Skin warm/dry Patient not depressed No peripheral clubbing Back-normal HEENT-normal/normal eyelids Neck supple/normal carotid upstroke bilaterally; no bruits; no JVD; no thyromegaly chest - CTA/ normal expansion CV - RRR/normal S1 and S2; no murmurs, rubs or gallops;  PMI nondisplaced Abdomen -NT/ND, no HSM, no mass, + bowel sounds, no bruit 2+ femoral pulses, no bruits Ext-no edema, chords, 2+ DP  Neuro-grossly nonfocal  ECG Sinus tachycardia at a rate of 101. Low voltage. Axis normal. Cannot rule out prior anterior infarct.

## 2011-01-08 NOTE — Patient Instructions (Signed)
Your physician recommends that you schedule a follow-up appointment in: 3 MONTHS  Your physician has requested that you have an echocardiogram. Echocardiography is a painless test that uses sound waves to create images of your heart. It provides your doctor with information about the size and shape of your heart and how well your heart's chambers and valves are working. This procedure takes approximately one hour. There are no restrictions for this procedure.

## 2011-01-08 NOTE — Assessment & Plan Note (Addendum)
Incidental note of a small amount of pericardial fluid on abdominal CT. She is not symptomatic with no dyspnea, no new chest pain. She does not have JVD. No recent viral syndrome. Recent TSH normal. She is on no medications that are typically associated with pericardial effusion. We'll schedule echocardiogram to further quantitate pericardial effusion although it sounds to be small. Her abdominal CT is not available for review.

## 2011-01-11 ENCOUNTER — Other Ambulatory Visit: Payer: Self-pay | Admitting: *Deleted

## 2011-01-11 MED ORDER — HYDROCHLOROTHIAZIDE 12.5 MG PO CAPS: 12.5000 mg | ORAL_CAPSULE | ORAL | Status: DC

## 2011-01-15 ENCOUNTER — Ambulatory Visit (HOSPITAL_COMMUNITY): Payer: Managed Care, Other (non HMO) | Attending: Cardiology | Admitting: Radiology

## 2011-01-15 DIAGNOSIS — I313 Pericardial effusion (noninflammatory): Secondary | ICD-10-CM

## 2011-01-15 DIAGNOSIS — I319 Disease of pericardium, unspecified: Secondary | ICD-10-CM

## 2011-01-15 DIAGNOSIS — I10 Essential (primary) hypertension: Secondary | ICD-10-CM | POA: Insufficient documentation

## 2011-01-15 DIAGNOSIS — I079 Rheumatic tricuspid valve disease, unspecified: Secondary | ICD-10-CM | POA: Insufficient documentation

## 2011-01-20 ENCOUNTER — Other Ambulatory Visit (HOSPITAL_COMMUNITY): Payer: Managed Care, Other (non HMO) | Admitting: Radiology

## 2011-01-20 ENCOUNTER — Ambulatory Visit: Payer: Managed Care, Other (non HMO) | Admitting: Cardiology

## 2011-01-25 ENCOUNTER — Other Ambulatory Visit: Payer: Self-pay | Admitting: *Deleted

## 2011-01-25 MED ORDER — HYDROCHLOROTHIAZIDE 12.5 MG PO CAPS: 12.5000 mg | ORAL_CAPSULE | ORAL | Status: DC

## 2011-01-26 DIAGNOSIS — N200 Calculus of kidney: Secondary | ICD-10-CM | POA: Insufficient documentation

## 2011-01-26 DIAGNOSIS — R319 Hematuria, unspecified: Secondary | ICD-10-CM

## 2011-01-26 HISTORY — DX: Hematuria, unspecified: R31.9

## 2011-02-10 ENCOUNTER — Encounter: Payer: Managed Care, Other (non HMO) | Admitting: Family Medicine

## 2011-02-12 ENCOUNTER — Encounter: Payer: Managed Care, Other (non HMO) | Admitting: Family Medicine

## 2011-03-05 ENCOUNTER — Ambulatory Visit (INDEPENDENT_AMBULATORY_CARE_PROVIDER_SITE_OTHER): Payer: Managed Care, Other (non HMO) | Admitting: Family Medicine

## 2011-03-05 VITALS — BP 146/83 | HR 67

## 2011-03-05 DIAGNOSIS — R300 Dysuria: Secondary | ICD-10-CM

## 2011-03-05 DIAGNOSIS — R309 Painful micturition, unspecified: Secondary | ICD-10-CM

## 2011-03-05 LAB — POCT URINALYSIS DIPSTICK
Ketones, UA: NEGATIVE
Protein, UA: 30
Spec Grav, UA: 1.025
Urobilinogen, UA: 0.2
pH, UA: 6

## 2011-03-05 MED ORDER — SULFAMETHOXAZOLE-TRIMETHOPRIM 800-160 MG PO TABS: 1.0000 | ORAL_TABLET | Freq: Two times a day (BID) | ORAL | Status: AC

## 2011-03-05 NOTE — Progress Notes (Signed)
  Subjective:    Patient ID: Rachel Mack, female    DOB: [birth year], 51 y.o.   MRN: [id number] Urine check HPI    Review of Systems     Objective:   Physical Exam        Assessment & Plan:  c/o burning, painful urination, odor and bladder spasms.  UTI - will tx with bactrim DS bid x 3 days.  No refills.

## 2011-04-14 ENCOUNTER — Encounter: Payer: Managed Care, Other (non HMO) | Admitting: Cardiology

## 2011-04-14 NOTE — Progress Notes (Signed)
   HPI: Pleasant female I initially saw in Nov 2012 for evaluation of pericardial effusion. Patient previously complaining of low back pain. She also had microscopic hematuria. A CT of her abdomen was ordered and performed on December 30, 2010. This revealed a small nonobstructing right lower renal pole calculus. There was a small amount of pericardial fluid noted. We were therefore asked to evaluate at that time. Note TSH in September of 2012 was normal. Echo in Nov 2012 revealed normal LV function, grade 1 diastolic dysfunction and a small pericardial effusion. Since I last saw her  Current Outpatient Prescriptions  Medication Sig Dispense Refill  . cyanocobalamin (,VITAMIN B-12,) 1000 MCG/ML injection Inject 1 mL (1,000 mcg total) into the muscle every 30 (thirty) days.  3 mL  3  . hydrochlorothiazide (MICROZIDE) 12.5 MG capsule Take 1 capsule (12.5 mg total) by mouth every morning.  90 capsule  1  . Norgestim-Eth Estrad Triphasic (ORTHO TRI-CYCLEN LO) 0.18/0.215/0.25 MG-25 MCG TABS Take 1 tablet by mouth daily.  84 tablet  2  . SYNTHROID 50 MCG tablet Take 1 tablet (50 mcg total) by mouth daily. Brand Medically Neccessary  90 tablet  1     Past Medical History  Diagnosis Date  . Thyroid disease   . Depression   . Perimenopausal   . Vitamin B 12 deficiency   . Hypertension   . Nephrolithiasis     Past Surgical History  Procedure Date  . Gynecologic cryosurgery     cervicitis    History   Social History  . Marital Status: Married    Spouse Name: N/A    Number of Children: 4  . 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 Smoking status: Former Smoker    Quit date: 02/23/2004  . Smokeless tobacco: Not on file  . Alcohol Use: No  . Drug Use: No  . Sexually Active: Not on file     Works out regularly, married, 4 sons, youngest .   Other Topics Concern  . Not on file   Social History Narrative  . No narrative on file     ROS: no fevers or chills, productive cough, hemoptysis, dysphasia, odynophagia, melena, hematochezia, dysuria, hematuria, rash, seizure activity, orthopnea, PND, pedal edema, claudication. Remaining systems are negative.  Physical Exam: Well-developed well-nourished in no acute distress.  Skin is warm and dry.  HEENT is normal.  Neck is supple. No thyromegaly.  Chest is clear to auscultation with normal expansion.  Cardiovascular exam is regular rate and rhythm.  Abdominal exam nontender or distended. No masses palpated. Extremities show no edema. neuro grossly intact  ECG     This encounter was created in error - please disregard.

## 2011-04-22 ENCOUNTER — Encounter: Payer: Self-pay | Admitting: *Deleted

## 2011-04-30 ENCOUNTER — Encounter: Payer: Self-pay | Admitting: Family Medicine

## 2011-04-30 ENCOUNTER — Other Ambulatory Visit (HOSPITAL_COMMUNITY)
Admission: RE | Admit: 2011-04-30 | Discharge: 2011-04-30 | Disposition: A | Discharge status: Home / Self Care | Payer: Managed Care, Other (non HMO) | Source: Ambulatory Visit | Attending: Family Medicine | Admitting: Family Medicine

## 2011-04-30 ENCOUNTER — Ambulatory Visit (INDEPENDENT_AMBULATORY_CARE_PROVIDER_SITE_OTHER): Payer: Managed Care, Other (non HMO) | Admitting: Family Medicine

## 2011-04-30 VITALS — BP 129/83 | HR 74 | Ht 66.0 in

## 2011-04-30 DIAGNOSIS — Z01419 Encounter for gynecological examination (general) (routine) without abnormal findings: Secondary | ICD-10-CM

## 2011-04-30 DIAGNOSIS — Z1159 Encounter for screening for other viral diseases: Secondary | ICD-10-CM | POA: Insufficient documentation

## 2011-04-30 DIAGNOSIS — G4700 Insomnia, unspecified: Secondary | ICD-10-CM

## 2011-04-30 MED ORDER — NORGESTIM-ETH ESTRAD TRIPHASIC 0.18/0.215/0.25 MG-25 MCG PO TABS: 1.0000 | ORAL_TABLET | Freq: Every day | ORAL | Status: DC

## 2011-04-30 NOTE — Progress Notes (Signed)
Subjective:     Rachel Mack is a 51 y.o. female and is here for a comprehensive physical exam. The patient reports problems - having difficuly sleeping.  Has regular periods on OCP. Only sleeping abou 4 hours per night. Not good quality sleep.  She does drink caffeine after lunchtime.  Doesn't watch bedroom.  she is very concerned about her recent weight gain. She says she feels her thyroid is well controlled and she had a normal level in September. She takes her thyroid medication regularly. She feels the weight gain is from her menopause as she is on birth control to control her symptoms. Her periods are regular on the birth control. She wants to know what she can do to boost her metabolism. She does not exercise or workout. She's not sure how many calories she is on daily basis. Overall she says she tried the healthy. She works full-time and takes care of her grandkids at night and says she does not have time to work out regularly.  History   Social History  . Marital Status: Married    Spouse Name: N/A    Number of Children: 4  . 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 Smoking status: Former Smoker    Quit date: 02/23/2004  . Smokeless tobacco: Not on file  . Alcohol Use: No  . Drug Use: No  . Sexually Active: Not on file     Works out regularly, married, 4 sons, youngest .   Other Topics Concern  . Not on file   Social History Narrative  . No narrative on file   Health Maintenance  Topic Date Due  . Influenza Vaccine  11/23/2011  . Pap Smear  04/30/2014  . Tetanus/tdap  12/27/2018    The following portions of the patient's history were reviewed and updated as appropriate: allergies, current medications, past family history, past medical history, past social history, past surgical history and problem list.  Review of Systems A comprehensive review of systems was negative.   Objective:    BP 129/83  Pulse 74  Ht  5\' 6"  (1.676 m)  LMP 03/26/2011 General appearance: alert, cooperative and appears stated age Head: Normocephalic, without obvious abnormality, atraumatic Eyes: conj clear, EOMi, PEERLA Ears: normal TM's and external ear canals both ears Nose: Nares normal. Septum midline. Mucosa normal. No drainage or sinus tenderness. Throat: lips, mucosa, and tongue normal; teeth and gums normal Neck: no adenopathy, no carotid bruit, no JVD, supple, symmetrical, trachea midline and thyroid not enlarged, symmetric, no tenderness/mass/nodules Back: symmetric, no curvature. ROM normal. No CVA tenderness. Lungs: clear to auscultation bilaterally Breasts: normal appearance, no masses or tenderness, right nipple is retracted.  Heart: regular rate and rhythm, S1, S2 normal, no murmur, click, rub or gallop Abdomen: soft, non-tender; bowel sounds normal; no masses,  no organomegaly Pelvic: cervix normal in appearance, external genitalia normal, no adnexal masses or tenderness, no cervical motion tenderness, rectovaginal septum normal, uterus normal size, shape, and consistency, vagina normal without discharge and does have some prolapsed bladder.  Extremities: extremities normal, atraumatic, no cyanosis or edema Pulses: 2+ and symmetric Skin: Skin color, texture, turgor normal. No rashes or lesions Lymph nodes: Cervical, supraclavicular, and axillary nodes normal. Neurologic: Grossly normal    Assessment:    Healthy female exam.      Plan:     See After Visit Summary for Counseling Recommendations  Start a regular exercise program and make sure you are eating a healthy diet Try to eat 4 servings of dairy a day or take a calcium supplement (500mg  twice a day). Your vaccines are up to date.   Discussed weight gain - Discussed the need for diet and exercise changes.  She is already on birth control.   Insomnia - Difficulty falling and staying asleep. Trial of melatonin and work on avoiding caffiene after  lunch time.  If not helping after 2 weeks then let mek now and consider trazodone

## 2011-04-30 NOTE — Patient Instructions (Addendum)
Start a regular exercise program and make sure you are eating a healthy diet Try to eat 4 servings of dairy a day or take a calcium supplement (500mg  twice a day). Your vaccines are up to date.  Try melatonin 5-10mg  by mouth at bedtime. If not helping after 2 weeks then let me know and we can consider another option.  Avoid caffeine after lunch.

## 2011-05-01 LAB — COMPLETE METABOLIC PANEL WITH GFR
BUN: 9 mg/dL (ref 6–23)
CO2: 23 mEq/L (ref 19–32)
Calcium: 8.9 mg/dL (ref 8.4–10.5)
Creat: 0.7 mg/dL (ref 0.50–1.10)
GFR, Est African American: >89 mL/min
GFR, Est Non African American: >89 mL/min
Glucose, Bld: 87 mg/dL (ref 70–99)
Total Bilirubin: 0.5 mg/dL (ref 0.3–1.2)

## 2011-05-01 LAB — LIPID PANEL
HDL: 53 mg/dL (ref >39)
Triglycerides: 159 mg/dL — ABNORMAL HIGH (ref <150)

## 2011-05-05 ENCOUNTER — Other Ambulatory Visit: Payer: Self-pay | Admitting: *Deleted

## 2011-05-05 MED ORDER — NORGESTIM-ETH ESTRAD TRIPHASIC 0.18/0.215/0.25 MG-25 MCG PO TABS: 1.0000 | ORAL_TABLET | Freq: Every day | ORAL | Status: DC

## 2011-06-05 ENCOUNTER — Other Ambulatory Visit: Payer: Self-pay | Admitting: Family Medicine

## 2011-06-06 ENCOUNTER — Other Ambulatory Visit: Payer: Self-pay | Admitting: Family Medicine

## 2011-06-17 ENCOUNTER — Ambulatory Visit (INDEPENDENT_AMBULATORY_CARE_PROVIDER_SITE_OTHER): Payer: Managed Care, Other (non HMO) | Admitting: Family Medicine

## 2011-06-17 ENCOUNTER — Encounter: Payer: Self-pay | Admitting: Family Medicine

## 2011-06-17 VITALS — BP 126/87 | HR 74

## 2011-06-17 DIAGNOSIS — I10 Essential (primary) hypertension: Secondary | ICD-10-CM

## 2011-06-17 DIAGNOSIS — R6882 Decreased libido: Secondary | ICD-10-CM

## 2011-06-17 MED ORDER — AMBULATORY NON FORMULARY MEDICATION: Status: DC

## 2011-06-17 NOTE — Patient Instructions (Signed)
Call me after a few weeks and let me know cream is helping or not.

## 2011-06-17 NOTE — Progress Notes (Signed)
  Subjective:    Patient ID: Rachel Mack, female    DOB: Feb 14, 1961, 51 y.o.   MRN: [id number]  HPI She wants to discuss menopause.  Her main complaint today is Dec libido. Still on birth control. She is happy in her relationship. Says has taken Wellbutrin in the past and did well on the Brand but not the generic in the past.     Review of Systems     Objective:   Physical Exam  Constitutional: She appears well-developed and well-nourished.  HENT:  Head: Normocephalic and atraumatic.          Assessment & Plan:  Decreased libido- discussed different options. One option is to use Wellbutrin. I asked her that this is an off label use but we could certainly try a period she was a little hesitant because of her reaction to the generic version (thinks caused palpitations). We could always write for the Brand and see if that works well, since has done well with that in the past. It may require prior auth if she decides to do that. I also discussed using DHEA topical cream applied to the clitoris after shower daily. She said she would like to start with the cream first. I did ask her to call me in 3 weeks let me know if it is helping her working well. If not then we can consider going back to using the Wellbutrin.  HTN - Bp well controlled.

## 2011-06-25 ENCOUNTER — Other Ambulatory Visit: Payer: Self-pay | Admitting: *Deleted

## 2011-06-25 MED ORDER — NORGESTIM-ETH ESTRAD TRIPHASIC 0.18/0.215/0.25 MG-25 MCG PO TABS: 1.0000 | ORAL_TABLET | Freq: Every day | ORAL | Status: DC

## 2011-06-28 ENCOUNTER — Other Ambulatory Visit: Payer: Self-pay | Admitting: *Deleted

## 2011-06-28 MED ORDER — NORGESTIM-ETH ESTRAD TRIPHASIC 0.18/0.215/0.25 MG-25 MCG PO TABS: 1.0000 | ORAL_TABLET | Freq: Every day | ORAL | Status: DC

## 2011-07-27 ENCOUNTER — Other Ambulatory Visit: Payer: Self-pay | Admitting: *Deleted

## 2011-07-27 MED ORDER — NORGESTIM-ETH ESTRAD TRIPHASIC 0.18/0.215/0.25 MG-25 MCG PO TABS: 1.0000 | ORAL_TABLET | Freq: Every day | ORAL | Status: DC

## 2011-08-18 ENCOUNTER — Other Ambulatory Visit: Payer: Self-pay | Admitting: *Deleted

## 2011-08-18 MED ORDER — SYNTHROID 50 MCG PO TABS: 50.0000 ug | ORAL_TABLET | Freq: Every day | ORAL | Status: DC

## 2011-09-09 ENCOUNTER — Telehealth: Payer: Self-pay | Admitting: *Deleted

## 2011-09-09 NOTE — Telephone Encounter (Signed)
I agree needs appt. Can't give note for this.

## 2011-09-09 NOTE — Telephone Encounter (Signed)
Pt calls and states she has been going through menopause and not sleeping at all at night and she did not go to work today and was wantiong a note from you. I explained that you would not do this without seeing her but I would send you a message anyway. Please advise

## 2011-09-09 NOTE — Telephone Encounter (Signed)
Lm with MD instructions. KG LPN

## 2011-10-05 ENCOUNTER — Other Ambulatory Visit: Payer: Self-pay | Admitting: *Deleted

## 2011-10-05 MED ORDER — HYDROCHLOROTHIAZIDE 12.5 MG PO CAPS: 12.5000 mg | ORAL_CAPSULE | ORAL | Status: DC

## 2011-10-26 ENCOUNTER — Other Ambulatory Visit: Payer: Self-pay | Admitting: Family Medicine

## 2011-10-26 DIAGNOSIS — Z139 Encounter for screening, unspecified: Secondary | ICD-10-CM

## 2011-10-29 ENCOUNTER — Ambulatory Visit (INDEPENDENT_AMBULATORY_CARE_PROVIDER_SITE_OTHER): Payer: Managed Care, Other (non HMO)

## 2011-10-29 DIAGNOSIS — R928 Other abnormal and inconclusive findings on diagnostic imaging of breast: Secondary | ICD-10-CM

## 2011-10-29 DIAGNOSIS — Z139 Encounter for screening, unspecified: Secondary | ICD-10-CM

## 2011-11-01 ENCOUNTER — Encounter: Payer: Self-pay | Admitting: Family Medicine

## 2011-11-01 ENCOUNTER — Ambulatory Visit (INDEPENDENT_AMBULATORY_CARE_PROVIDER_SITE_OTHER): Payer: Managed Care, Other (non HMO) | Admitting: Family Medicine

## 2011-11-01 ENCOUNTER — Other Ambulatory Visit: Payer: Self-pay | Admitting: Family Medicine

## 2011-11-01 VITALS — BP 136/90 | HR 75 | Ht 65.0 in | Wt 148.0 lb

## 2011-11-01 DIAGNOSIS — E039 Hypothyroidism, unspecified: Secondary | ICD-10-CM

## 2011-11-01 DIAGNOSIS — R928 Other abnormal and inconclusive findings on diagnostic imaging of breast: Secondary | ICD-10-CM

## 2011-11-01 DIAGNOSIS — G4700 Insomnia, unspecified: Secondary | ICD-10-CM

## 2011-11-01 HISTORY — DX: Insomnia, unspecified: G47.00

## 2011-11-01 MED ORDER — SYNTHROID 50 MCG PO TABS: 50.0000 ug | ORAL_TABLET | Freq: Every day | ORAL | Status: DC

## 2011-11-01 NOTE — Progress Notes (Signed)
CC: Rachel Mack is a 51 y.o. female is here for Insomnia and Hypothyroidism   Subjective: HPI:  Presents due to concerns of insomnia. This is been present for matter of years seems to be getting worse over the past months. Describes this as trouble staying asleep rather than trouble getting asleep. She describes going to be  no later than 11 PM every night and then waking at 3 AM with an inability to go back to sleep. She describes her mind is racing but not thinking about anything in particular on these occasions. She denies any anxiety or fevers occurring at night. She denies depression or paranoia. Interventions have included melatonin without much relief. She denies taking naps during the day but does feel tired throughout the day. Nothing seems to make this better or worse.  She like to know if her thyroid needs to be rechecked she currently taking 50 mcg of levothyroxine. She denies fevers, chills, restlessness, bowel irregularities, abnormal weight gain or loss, tremor, nor irregular heartbeat.     Review Of Systems Outlined In HPI  Past Medical History  Diagnosis Date  . Thyroid disease   . Depression   . Perimenopausal   . Vitamin B 12 deficiency   . Hypertension   . Nephrolithiasis      Family History  Problem Relation Age of Onset  . Cancer Mother   . Diabetes Mother   . Hyperlipidemia Mother   . Hypertension Mother   . Drug abuse Sister     died overdose/suicide  . Colon cancer       History  Substance Use Topics  . Smoking status: Former Smoker    Quit date: 02/23/2004  . Smokeless tobacco: Not on file  . Alcohol Use: No     Objective: Filed Vitals:   11/01/11 0911  BP: 136/90  Pulse: 75    General: Alert and Oriented, No Acute Distress HEENT: Pupils equal, round, reactive to light. Conjunctivae clear.  External ears unremarkable, canals clear with intact TMs with appropriate landmarks.  Neck supple without palpable lymphadenopathy nor abnormal  masses. Lungs: Clear to auscultation bilaterally, no wheezing/ronchi/rales.  Comfortable work of breathing. Good air movement. Cardiac: Regular rate and rhythm. Normal S1/S2.  No murmurs, rubs, nor gallops.   Mental Status: No depression, anxiety, nor agitation. Skin: Warm and dry.  Assessment & Plan: Rachel Mack was seen today for insomnia and hypothyroidism.  Diagnoses and associated orders for this visit:  Unspecified hypothyroidism - TSH - SYNTHROID 50 MCG tablet; Take 1 tablet (50 mcg total) by mouth daily. Brand Medically Neccessary  Insomnia  Other Orders - SYNTHROID 50 MCG tablet; Take 1 tablet (50 mcg total) by mouth daily. Brand Medically Neccessary     TSH is not due until September 25 of asked her to present her lab slip on her after that date. Synthroid refilled for the time being. Discussed over-the-counter and behavioral options for insomnia she will try Tylenol PM and will call she feels she needs to start Ambien.  Return in 6 months or as needed.   Return in about 6 months (around 04/30/2012).  Requested Prescriptions   Signed Prescriptions Disp Refills  . SYNTHROID 50 MCG tablet 30 tablet 0    Sig: Take 1 tablet (50 mcg total) by mouth daily. Brand Medically Neccessary  . SYNTHROID 50 MCG tablet 90 tablet 3    Sig: Take 1 tablet (50 mcg total) by mouth daily. Brand Medically Neccessary

## 2011-11-02 ENCOUNTER — Ambulatory Visit
Admission: RE | Admit: 2011-11-02 | Discharge: 2011-11-02 | Disposition: A | Discharge status: Home / Self Care | Payer: Managed Care, Other (non HMO) | Source: Ambulatory Visit | Attending: Family Medicine | Admitting: Family Medicine

## 2011-11-02 DIAGNOSIS — R928 Other abnormal and inconclusive findings on diagnostic imaging of breast: Secondary | ICD-10-CM

## 2012-01-27 ENCOUNTER — Encounter: Payer: Self-pay | Admitting: Family Medicine

## 2012-01-27 ENCOUNTER — Ambulatory Visit (INDEPENDENT_AMBULATORY_CARE_PROVIDER_SITE_OTHER): Payer: Managed Care, Other (non HMO) | Admitting: Family Medicine

## 2012-01-27 VITALS — BP 128/81 | HR 77 | Temp 97.4°F

## 2012-01-27 DIAGNOSIS — E038 Other specified hypothyroidism: Secondary | ICD-10-CM

## 2012-01-27 DIAGNOSIS — E538 Deficiency of other specified B group vitamins: Secondary | ICD-10-CM

## 2012-01-27 DIAGNOSIS — E039 Hypothyroidism, unspecified: Secondary | ICD-10-CM

## 2012-01-27 DIAGNOSIS — I10 Essential (primary) hypertension: Secondary | ICD-10-CM

## 2012-01-27 DIAGNOSIS — E559 Vitamin D deficiency, unspecified: Secondary | ICD-10-CM | POA: Insufficient documentation

## 2012-01-27 DIAGNOSIS — J029 Acute pharyngitis, unspecified: Secondary | ICD-10-CM

## 2012-01-27 MED ORDER — NORGESTIM-ETH ESTRAD TRIPHASIC 0.18/0.215/0.25 MG-25 MCG PO TABS: 1.0000 | ORAL_TABLET | Freq: Every day | ORAL | Status: DC

## 2012-01-27 MED ORDER — PREDNISONE 20 MG PO TABS: 40.0000 mg | ORAL_TABLET | Freq: Every day | ORAL | Status: DC

## 2012-01-27 MED ORDER — SYNTHROID 50 MCG PO TABS: 50.0000 ug | ORAL_TABLET | Freq: Every day | ORAL | Status: DC

## 2012-01-27 MED ORDER — CYANOCOBALAMIN 1000 MCG/ML IJ SOLN
1000.0000 ug | Freq: Once | INTRAMUSCULAR | Status: AC
  Administered 2012-01-27: 1000 ug via INTRAMUSCULAR

## 2012-01-27 NOTE — Progress Notes (Signed)
  Subjective:    Patient ID: Rachel Mack, female    DOB: [birth year], 51 y.o.   MRN: [id number]  HPI ST x 3 weeks. Was put on zpack  And got better for a few days and then came right back.  No fever.  Feels really tired.  No rash.  Had ear infection on right 3 weeks ago. She says she has always tested negative for strep. She's had a history of recurrent sore throats always test negative.  Hypothyroidism-she has not been here in over a year. She would like to get her levels rechecked. She feels like her symptoms are currently well controlled except for some fatigue. This is not unusual for her.   Review of Systems     Objective:   Physical Exam  Constitutional: She is oriented to person, place, and time. She appears well-developed and well-nourished.  HENT:  Head: Normocephalic and atraumatic.  Right Ear: External ear normal.  Left Ear: External ear normal.  Nose: Nose normal.  Mouth/Throat: Oropharynx is clear and moist.       TMs and canals are clear.   Eyes: Conjunctivae normal and EOM are normal. Pupils are equal, round, and reactive to light.  Neck: Neck supple. No thyromegaly present.  Cardiovascular: Normal rate, regular rhythm and normal heart sounds.   Pulmonary/Chest: Effort normal and breath sounds normal. She has no wheezes.  Lymphadenopathy:    She has no cervical adenopathy.  Neurological: She is alert and oriented to person, place, and time.  Skin: Skin is warm and dry.  Psychiatric: She has a normal mood and affect.          Assessment & Plan:  Pharyngitis x3 weeks-am suspicious of either EBV or CMV. I explained to her that with strep throat she should have cleared the infection with the azithromycin. And typically strep throat we'll actually clear WITHIN 2 WEEKS EVEN WITHOUT ANTIBIOTICS. Her throat on exam actually looks completely normal. She denies any allergic type symptoms or cold-type symptoms. At this point I offered to test her for CMV and EBV. She  declined to be tested for these.   I explained to her that if she does have a viral illness, we typically let it run its course and we do not use antibiotics. Certainly we can try prednisone for 5 days to see if this provides any relief for her. If she's not feeling better at that time then please call the office back. If the sore throat persists for more than 6-8 weeks then recommend referral to ENT for further evaluation. Ciardi has a lidocaine gel at home for when necessary use it was given her abdomen in clinic.  Otitis media-resolved. Ear exam is completely normal today.  History of B12 deficiency-she would like to restart B12 injections. She says she feels it is really help her with her energy level. Thus we will check a level today and she can come back up for her first injection.  Hypothyroidism-check TSH. Will call and adjust medication if needed. Refill sent to the pharmacy today.  Contraceptive counseling-patient like refills on her birth control. Her Pap smear is up-to-date.

## 2012-01-28 LAB — COMPLETE METABOLIC PANEL WITH GFR
AST: 13 U/L (ref 0–37)
Albumin: 4.2 g/dL (ref 3.5–5.2)
Alkaline Phosphatase: 37 U/L — ABNORMAL LOW (ref 39–117)
BUN: 9 mg/dL (ref 6–23)
Potassium: 3.8 mEq/L (ref 3.5–5.3)
Sodium: 140 mEq/L (ref 135–145)
Total Bilirubin: 0.4 mg/dL (ref 0.3–1.2)

## 2012-01-28 LAB — TSH: TSH: 1.321 u[IU]/mL (ref 0.350–4.500)

## 2012-02-01 ENCOUNTER — Other Ambulatory Visit: Payer: Self-pay | Admitting: *Deleted

## 2012-02-01 DIAGNOSIS — E039 Hypothyroidism, unspecified: Secondary | ICD-10-CM

## 2012-02-01 MED ORDER — SYNTHROID 50 MCG PO TABS: 50.0000 ug | ORAL_TABLET | Freq: Every day | ORAL | Status: DC

## 2012-02-01 MED ORDER — NORGESTIM-ETH ESTRAD TRIPHASIC 0.18/0.215/0.25 MG-25 MCG PO TABS: 1.0000 | ORAL_TABLET | Freq: Every day | ORAL | Status: DC

## 2012-02-02 ENCOUNTER — Telehealth: Payer: Self-pay | Admitting: *Deleted

## 2012-02-02 DIAGNOSIS — J029 Acute pharyngitis, unspecified: Secondary | ICD-10-CM

## 2012-02-02 NOTE — Telephone Encounter (Signed)
Will refer to ENT. Read Dr. Linford Arnold note. Tried abx, tried prednisone. Use throat spray and lozengers until see ENT.

## 2012-02-02 NOTE — Telephone Encounter (Signed)
Pt was treated for mono and did start feeling better while on meds- finished med on Monday and now states starting to feel bad again, throat hurting again, weak, no energy. Told to call back if no better and could try a different med

## 2012-02-02 NOTE — Telephone Encounter (Signed)
Pt notified of PA instructions. 

## 2012-02-04 ENCOUNTER — Encounter: Payer: Self-pay | Admitting: Physician Assistant

## 2012-02-04 ENCOUNTER — Ambulatory Visit (INDEPENDENT_AMBULATORY_CARE_PROVIDER_SITE_OTHER): Payer: Managed Care, Other (non HMO) | Admitting: Physician Assistant

## 2012-02-04 VITALS — BP 137/88 | HR 75 | Temp 98.2°F | Ht 65.0 in | Wt 151.0 lb

## 2012-02-04 DIAGNOSIS — R5383 Other fatigue: Secondary | ICD-10-CM

## 2012-02-04 DIAGNOSIS — R5381 Other malaise: Secondary | ICD-10-CM

## 2012-02-04 DIAGNOSIS — J029 Acute pharyngitis, unspecified: Secondary | ICD-10-CM

## 2012-02-04 MED ORDER — PREDNISONE 50 MG PO TABS: ORAL_TABLET | ORAL | Status: DC

## 2012-02-04 MED ORDER — MELOXICAM 7.5 MG PO TABS: 7.5000 mg | ORAL_TABLET | Freq: Every day | ORAL | Status: DC

## 2012-02-04 NOTE — Patient Instructions (Addendum)
Will give another round prednisone.   Will get labs to check CBC/EBV/CMV. Gave Mobic for pain.

## 2012-02-05 LAB — CBC WITH DIFFERENTIAL/PLATELET
Eosinophils Absolute: 0.2 10*3/uL (ref 0.0–0.7)
Hemoglobin: 14 g/dL (ref 12.0–15.0)
Lymphocytes Relative: 26 % (ref 12–46)
Lymphs Abs: 2.7 10*3/uL (ref 0.7–4.0)
MCH: 32.4 pg (ref 26.0–34.0)
Monocytes Relative: 7 % (ref 3–12)
Neutro Abs: 6.4 10*3/uL (ref 1.7–7.7)
Neutrophils Relative %: 65 % (ref 43–77)
Platelets: 297 10*3/uL (ref 150–400)
RBC: 4.32 MIL/uL (ref 3.87–5.11)
WBC: 10.1 10*3/uL (ref 4.0–10.5)

## 2012-02-06 NOTE — Progress Notes (Signed)
  Subjective:    Patient ID: Rachel Mack, female    DOB: 04-05-1960, 51 y.o.   MRN: [id number]  HPI Patient is a 51 yo female who presents to the clinic to follow up on sore throat and extreme fatigue. She saw Dr. Linford Arnold last week who suspected Mono. She treated with steroid pack and symptomatic treatment. Patient felt great while on steroid but after finished steroid sore throat and fatigue came back even worse. She denies any fever, chills, sweats or abdominal pain. She did not have labs to confirm mono. She has tried symptomatic care and does help relieved the pain.    Review of Systems     Objective:   Physical Exam  Constitutional: She is oriented to person, place, and time. She appears well-developed and well-nourished.  HENT:  Head: Normocephalic and atraumatic.  Right Ear: External ear normal.  Left Ear: External ear normal.  Nose: Nose normal.       TM's normal bilaterally.  Oropharynx is erythematous without exudate tonsils enlarged.   Eyes: Conjunctivae normal are normal.  Neck: Normal range of motion. Neck supple.  Cardiovascular: Normal rate, regular rhythm and normal heart sounds.   Pulmonary/Chest: Effort normal and breath sounds normal.  Abdominal: Soft. Bowel sounds are normal. She exhibits no distension and no mass. There is no tenderness.       NO organomegaly. No tenderness to palpation.   Lymphadenopathy:    She has no cervical adenopathy.  Neurological: She is alert and oriented to person, place, and time.  Skin: Skin is warm and dry.  Psychiatric: She has a normal mood and affect. Her behavior is normal.          Assessment & Plan:  Pharyngitis/Fatigue- Will check CBC and EBV and CMV. Since patient did feel so much better on prednisone i will give another taper since there is some data that for EBV/CMV longer periods of steroid can be beneficial. Pt has been taking motrin and not working as good. Did give an rx for mobic once a day to try.  Continue to stay hydrated. Call if symptoms worsening. Some of her symptoms sound like could be some adrenal issues going on will consider adrenal work up if not improving.

## 2012-02-07 LAB — CYTOMEGALOVIRUS ANTIBODY, IGG: Cytomegalovirus Ab-IgG: 4.82 — ABNORMAL HIGH (ref <0.90)

## 2012-02-07 LAB — EPSTEIN-BARR VIRUS NUCLEAR ANTIGEN ANTIBODY, IGG: EBV NA IgG: 15.2 U/mL (ref <18.0)

## 2012-03-10 ENCOUNTER — Other Ambulatory Visit: Payer: Self-pay | Admitting: Family Medicine

## 2012-06-12 ENCOUNTER — Ambulatory Visit (INDEPENDENT_AMBULATORY_CARE_PROVIDER_SITE_OTHER): Payer: Self-pay | Admitting: Physician Assistant

## 2012-06-12 ENCOUNTER — Encounter: Payer: Self-pay | Admitting: Physician Assistant

## 2012-06-12 VITALS — BP 124/82 | HR 73 | Wt 148.0 lb

## 2012-06-12 DIAGNOSIS — N390 Urinary tract infection, site not specified: Secondary | ICD-10-CM

## 2012-06-12 DIAGNOSIS — R3915 Urgency of urination: Secondary | ICD-10-CM

## 2012-06-12 DIAGNOSIS — R300 Dysuria: Secondary | ICD-10-CM

## 2012-06-12 LAB — POCT URINALYSIS DIPSTICK
Glucose, UA: NEGATIVE
Nitrite, UA: NEGATIVE
Protein, UA: 30
Urobilinogen, UA: 0.2

## 2012-06-12 MED ORDER — CIPROFLOXACIN HCL 500 MG PO TABS: 500.0000 mg | ORAL_TABLET | Freq: Two times a day (BID) | ORAL | Status: DC

## 2012-06-12 NOTE — Patient Instructions (Addendum)
Urinary Tract Infection Urinary tract infections (UTIs) can develop anywhere along your urinary tract. Your urinary tract is your body's drainage system for removing wastes and extra water. Your urinary tract includes two kidneys, two ureters, a bladder, and a urethra. Your kidneys are a pair of bean-shaped organs. Each kidney is about the size of your fist. They are located below your ribs, one on each side of your spine. CAUSES Infections are caused by microbes, which are microscopic organisms, including fungi, viruses, and bacteria. These organisms are so small that they can only be seen through a microscope. Bacteria are the microbes that most commonly cause UTIs. SYMPTOMS  Symptoms of UTIs may vary by age and gender of the patient and by the location of the infection. Symptoms in young women typically include a frequent and intense urge to urinate and a painful, burning feeling in the bladder or urethra during urination. Older women and men are more likely to be tired, shaky, and weak and have muscle aches and abdominal pain. A fever may mean the infection is in your kidneys. Other symptoms of a kidney infection include pain in your back or sides below the ribs, nausea, and vomiting. DIAGNOSIS To diagnose a UTI, your caregiver will ask you about your symptoms. Your caregiver also will ask to provide a urine sample. The urine sample will be tested for bacteria and white blood cells. White blood cells are made by your body to help fight infection. TREATMENT  Typically, UTIs can be treated with medication. Because most UTIs are caused by a bacterial infection, they usually can be treated with the use of antibiotics. The choice of antibiotic and length of treatment depend on your symptoms and the type of bacteria causing your infection. HOME CARE INSTRUCTIONS  If you were prescribed antibiotics, take them exactly as your caregiver instructs you. Finish the medication even if you feel better after you  have only taken some of the medication.  Drink enough water and fluids to keep your urine clear or pale yellow.  Avoid caffeine, tea, and carbonated beverages. They tend to irritate your bladder.  Empty your bladder often. Avoid holding urine for long periods of time.  Empty your bladder before and after sexual intercourse.  After a bowel movement, women should cleanse from front to back. Use each tissue only once. SEEK MEDICAL CARE IF:   You have back pain.  You develop a fever.  Your symptoms do not begin to resolve within 3 days. SEEK IMMEDIATE MEDICAL CARE IF:   You have severe back pain or lower abdominal pain.  You develop chills.  You have nausea or vomiting.  You have continued burning or discomfort with urination. MAKE SURE YOU:   Understand these instructions.  Will watch your condition.  Will get help right away if you are not doing well or get worse. Document Released: 11/18/2004 Document Revised: 08/10/2011 Document Reviewed: 03/19/2011 ExitCare Patient Information 2013 [employer].  

## 2012-06-12 NOTE — Progress Notes (Signed)
  Subjective:    Patient ID: Rachel Mack, female    DOB: 1960/08/16, 52 y.o.   MRN: [id number]  Dysuria  This is a new problem. The current episode started yesterday. The problem occurs intermittently. The problem has been gradually worsening. The quality of the pain is described as burning and aching. The pain is at a severity of 4/10. The pain is mild. There has been no fever. She is sexually active. There is a history of pyelonephritis. Associated symptoms include frequency, hesitancy and urgency. Pertinent negatives include no chills, discharge, flank pain, hematuria, nausea, possible pregnancy, sweats or vomiting. She has tried acetaminophen and increased fluids for the symptoms. The treatment provided no relief.      Review of Systems  Constitutional: Negative for chills.  Gastrointestinal: Negative for nausea and vomiting.  Genitourinary: Positive for dysuria, hesitancy, urgency and frequency. Negative for hematuria and flank pain.       Objective:   Physical Exam  Constitutional: She is oriented to person, place, and time. She appears well-developed and well-nourished.  HENT:  Head: Normocephalic and atraumatic.  Cardiovascular: Normal rate, regular rhythm and normal heart sounds.   Pulmonary/Chest: Effort normal and breath sounds normal.  No cva tenderness.  Abdominal: Soft. Bowel sounds are normal. She exhibits no mass. There is no tenderness. There is no rebound and no guarding.  Neurological: She is alert and oriented to person, place, and time.  Skin: Skin is warm and dry.  Psychiatric: She has a normal mood and affect. Her behavior is normal.          Assessment & Plan:  UTI- Pt does not have insurance and need abx on 4 dollars list gave cipro for 7 days. UA positive for blood and leuks. Gave handout. Stay hydrated. Call if not improving.

## 2012-06-14 ENCOUNTER — Telehealth: Payer: Self-pay | Admitting: *Deleted

## 2012-06-14 MED ORDER — FLUCONAZOLE 150 MG PO TABS: 150.0000 mg | ORAL_TABLET | Freq: Once | ORAL | Status: DC

## 2012-06-14 NOTE — Telephone Encounter (Signed)
Sent to pharm ....

## 2012-06-14 NOTE — Telephone Encounter (Signed)
Pt calls and states you put her on antibiotics and she has a yeast infection now and would like Diflucan sent over to [facility] for her

## 2012-06-14 NOTE — Telephone Encounter (Signed)
Pt notified sent to pharmacy. Barry Dienes, LPN

## 2012-07-25 ENCOUNTER — Other Ambulatory Visit: Payer: Self-pay | Admitting: *Deleted

## 2012-07-25 DIAGNOSIS — E039 Hypothyroidism, unspecified: Secondary | ICD-10-CM

## 2012-07-25 MED ORDER — SYNTHROID 50 MCG PO TABS: 50.0000 ug | ORAL_TABLET | Freq: Every day | ORAL | Status: DC

## 2012-07-25 MED ORDER — NORGESTIM-ETH ESTRAD TRIPHASIC 0.18/0.215/0.25 MG-25 MCG PO TABS: 1.0000 | ORAL_TABLET | Freq: Every day | ORAL | Status: DC

## 2012-07-31 ENCOUNTER — Ambulatory Visit (INDEPENDENT_AMBULATORY_CARE_PROVIDER_SITE_OTHER): Payer: BC Managed Care – PPO | Admitting: Physician Assistant

## 2012-07-31 ENCOUNTER — Encounter: Payer: Self-pay | Admitting: Physician Assistant

## 2012-07-31 VITALS — BP 155/96 | HR 81 | Wt 147.0 lb

## 2012-07-31 DIAGNOSIS — I10 Essential (primary) hypertension: Secondary | ICD-10-CM

## 2012-07-31 DIAGNOSIS — G4700 Insomnia, unspecified: Secondary | ICD-10-CM

## 2012-07-31 MED ORDER — AMITRIPTYLINE HCL 25 MG PO TABS: 25.0000 mg | ORAL_TABLET | Freq: Every day | ORAL | Status: DC

## 2012-07-31 NOTE — Progress Notes (Signed)
  Subjective:    Patient ID: Rachel Mack, female    DOB: [birth year], 52 y.o.   MRN: [id number]  HPI The patient is a 52 year old female who presents to the clinic with insomnia. She has a long history of problems going to sleep. She reports she has spoken with Dr. Glade Lloyd on multiple occasions. She has tried melatonin up to 6 mg and has helped for a while but she continues to have problems going to sleep and sometimes staying asleep. She denies any snoring. If she does sleep she tends to wake up rested. She does not like the idea of taking a been so or a sedative to help her sleep. She is concerned because when she wakes up too early and does not go back to sleep she feels like she cannot work. 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 This morning she woke up at 2 AM he cannot go back to sleep until 5 AM. She does not note any triggers that makes symptoms better or worse. Thyroid looked great when tested back in December 2013.  Hypertension is uncontrolled today. Patient admits to not taking blood pressure medication this morning. Denies any chest pains, palpitations, shortness of breath, headaches.   Review of Systems     Objective:   Physical Exam  Constitutional: She is oriented to person, place, and time. She appears well-developed and well-nourished.  HENT:  Head: Normocephalic and atraumatic.  Neck: Normal range of motion. Neck supple. No thyromegaly present.  Cardiovascular: Normal rate, regular rhythm and normal heart sounds.   Pulmonary/Chest: Effort normal and breath sounds normal. She has no wheezes.  Neurological: She is alert and oriented to person, place, and time.  Skin: Skin is warm and dry.  Psychiatric: She has a normal mood and affect. Her behavior is normal.          Assessment & Plan:  Insomnia-I. encouraged patient to increase her melatonin to 10 mg nightly and consider adding Valerian root 300 mg one hour before bed. I suggested this  because patient does not want to start other medication for sleeping. Patient again was instructed on good sleep hygiene and routine. I do not suspect sleep apnea do to not snoring and when she does sleep getting to full breast. I did give her a prescription for amitriptyline to start at bedtime to help with sleep and maybe shutting her mind down if that is a problem. Patient was instructed to return to clinic in 2 months.  Hypertension-patient did not take medication this morning therefore we'll not make any adjustments to medications but did reinforce the importance of taking medication daily.

## 2012-07-31 NOTE — Patient Instructions (Signed)
valerian root 300mg  at bedtime. Continue with Melatonin. Use amitripyline if not helping. Good bedtime routine. Follow up as needed.

## 2012-08-15 ENCOUNTER — Other Ambulatory Visit: Payer: Self-pay

## 2012-08-15 ENCOUNTER — Ambulatory Visit (INDEPENDENT_AMBULATORY_CARE_PROVIDER_SITE_OTHER): Payer: BC Managed Care – PPO | Admitting: Sports Medicine

## 2012-08-15 VITALS — BP 134/86 | HR 81 | Wt 151.0 lb

## 2012-08-15 DIAGNOSIS — M545 Low back pain, unspecified: Secondary | ICD-10-CM

## 2012-08-15 MED ORDER — KETOROLAC TROMETHAMINE 30 MG/ML IJ SOLN
30.0000 mg | Freq: Once | INTRAMUSCULAR | Status: AC
  Administered 2012-08-15: 30 mg via INTRAMUSCULAR

## 2012-08-15 MED ORDER — PREDNISONE 50 MG PO TABS: ORAL_TABLET | ORAL | Status: DC

## 2012-08-15 MED ORDER — MELOXICAM 15 MG PO TABS: ORAL_TABLET | ORAL | Status: DC

## 2012-08-15 NOTE — Progress Notes (Signed)
  Subjective:    CC: Back pain  HPI: Rachel Mack is a very pleasant 52 year old female with a long history of back pain. More recently, she was at work today and noted a pain that she localized in her mid lumbar spine bilaterally, with radiation into the posterior buttock and thigh but not past the knee. Pain is worse with standing, and spine extension, and better with sitting, and riding in a car. It is not worse with Valsalva. Denies any fevers, chills, saddle anesthesia, urinary or stool incontinence. Symptoms are severe, persistent.  Past medical history, Surgical history, Family history not pertinant except as noted below, Social history, Allergies, and medications have been entered into the medical record, reviewed, and no changes needed.   Review of Systems: No fevers, chills, night sweats, weight loss, chest pain, or shortness of breath.   Objective:    General: Well Developed, well nourished, and in no acute distress.  Neuro: Alert and oriented x3, extra-ocular muscles intact, sensation grossly intact.  HEENT: Normocephalic, atraumatic, pupils equal round reactive to light, neck supple, no masses, no lymphadenopathy, thyroid nonpalpable.  Skin: Warm and dry, no rashes. Cardiac: Regular rate and rhythm, no murmurs rubs or gallops, no lower extremity edema.  Respiratory: Clear to auscultation bilaterally. Not using accessory muscles, speaking in full sentences. Back Exam:  Inspection: Unremarkable  Motion: Flexion 45 deg, Extension 45 deg, Side Bending to 45 deg bilaterally,  Rotation to 45 deg bilaterally, exquisite pain with right-sided rotation as well as extension. SLR laying: Negative  XSLR laying: Negative  Palpable tenderness: None. FABER: negative. Sensory change: Gross sensation intact to all lumbar and sacral dermatomes.  Reflexes: 2+ at both patellar tendons, 2+ at achilles tendons, Babinski's downgoing.  Strength at foot  Plantar-flexion: 5/5 Dorsi-flexion: 5/5 Eversion:  5/5 Inversion: 5/5  Leg strength  Quad: 5/5 Hamstring: 5/5 Hip flexor: 5/5 Hip abductors: 5/5  Gait unremarkable.  I reviewed her CT scan of the abdomen and pelvis myself with special focus on the lumbar spine, she does have multilevel disc protrusions at L4-L5 as well as L5-S1, there is also moderate facet spondylosis at the right-sided L3-L4, L4-L5, and L5-S1 levels.  Impression and Recommendations:

## 2012-08-15 NOTE — Assessment & Plan Note (Signed)
Localized and sharp with radiation into the posterior thigh but not past, and worse with extension is classic for facet spondylosis. Based on my personal review CT scan, I do see right-sided L3-L4, L4-L5, and L5-S1 facet arthritis. We'll start conservatively with home exercises, prednisone, Mobic. Toradol 30 mg intramuscular here in the office. Return in 3-4 weeks, we will consider formal physical therapy if no better, this is an anticipation of possibly needing facet joint radio frequency ablation.

## 2012-08-15 NOTE — Patient Instructions (Addendum)
Facet Syndrome Facet syndrome is a condition where injury to the small joints between the bones in the spine (facet joints) causes back pain. Over rotation (twisting) or arching (extension) of the back may injure the joints or the soft disks between the spinal bones. Such injuries result in excessive motion of the facet joint. This causes the cartilage covering the facet joint to wear down. That places pressure on nerves, as they exit the spinal cord.  SYMPTOMS   Chronic dull ache in the low back, that gets worse with over-extension and rotation.  Pain in the low back, buttocks, hip, and sometimes leg.  Sometimes, stiffness of the low back. CAUSES  Facet syndrome is often caused by repeated or over rotation, over-extension, or extension with rotation of the back. These motions cause injury to the cartilage covering the facet joints. This places pressure on the spinal nerves. RISK INCREASES WITH:  Sports that can cause over-extension of the back, with rotation or repeatedly (golf, football, gymnastics, diving, weight-lifting, dancing, rifle shooting, wrestling, tennis, swimming, volleyball, track and field, rugby, other contact sports).  Poor back strength and flexibility.  Poor exercise technique. PREVENTION   Learn and use proper technique.  Warm up and stretch properly before activity.  Maintain physical fitness:  Back and hamstring flexibility.  Back muscle strength and endurance.  Cardiovascular fitness. PROGNOSIS  This condition is often resolved with proper non-surgical treatment.  RELATED COMPLICATIONS   Recurring symptoms, resulting in a chronic problem.  Delayed healing, especially if sports are resumed too soon.  Prolonged impairment.  Narrowed canal for the spinal cord, due to bone spurs (bumps) resulting from chronic erosion of the facet joints (spinal stenosis). TREATMENT  Treatment first involves stopping activities that aggravate your symptoms. Ice and  medicines may be used to reduce pain and inflammation. Your caregiver may advise strength and stretching activities, to be completed at home or with a therapist. You may be referred to a physical therapist for further treatment, including: ultrasound, manual adjustments, transcutaneous electronic nerve stimulation (TENS). Surgery is rarely needed. It is reserved for athletes with persistent pain, despite 6 to 12 months of proper non-surgical treatment. Surgery involves joining (fusing) two bones of the spinal column, to stop motion between the facet joint and disk. MEDICATION   If pain medicine is needed, nonsteroidal anti-inflammatory medicines (aspirin and ibuprofen), or other minor pain relievers (acetaminophen), are often advised.  Do not take pain medicine for 7 days before surgery.  Stronger pain relievers may be prescribed. Use only as directed and only as much as you need. HEAT AND COLD  Cold treatment (icing) relieves pain and reduces inflammation. Cold treatment should be applied for 10 to 15 minutes every 2 to 3 hours, and immediately after activity that aggravates your symptoms. Use ice packs or an ice massage.  Heat treatment may be used before performing stretching and strengthening activities advised by your caregiver, physical therapist, or athletic trainer. Use a heat pack or a warm water soak. SEEK MEDICAL CARE IF:   Symptoms get worse or do not improve in 2 to 4 weeks, despite treatment.  You develop numbness, weakness, or loss of bladder or bowel function.  New, unexplained symptoms develop. (Drugs used in treatment may produce side effects.) Document Released: 02/08/2005 Document Revised: 05/03/2011 Document Reviewed: 05/23/2008 ExitCare Patient Information 2014 [employer].  

## 2012-08-16 ENCOUNTER — Telehealth: Payer: Self-pay

## 2012-08-16 DIAGNOSIS — M545 Low back pain, unspecified: Secondary | ICD-10-CM

## 2012-08-16 NOTE — Telephone Encounter (Signed)
Pt called stated that she does want to start the physical therapy but she wants it set up for anytime after 3:00. Rhonda Cunningham,CMA

## 2012-08-17 ENCOUNTER — Other Ambulatory Visit: Payer: Self-pay | Admitting: *Deleted

## 2012-08-17 DIAGNOSIS — E039 Hypothyroidism, unspecified: Secondary | ICD-10-CM

## 2012-08-17 MED ORDER — SYNTHROID 50 MCG PO TABS: 50.0000 ug | ORAL_TABLET | Freq: Every day | ORAL | Status: DC

## 2012-08-17 MED ORDER — HYDROCHLOROTHIAZIDE 12.5 MG PO CAPS: ORAL_CAPSULE | ORAL | Status: DC

## 2012-08-17 MED ORDER — NORGESTIM-ETH ESTRAD TRIPHASIC 0.18/0.215/0.25 MG-25 MCG PO TABS: 1.0000 | ORAL_TABLET | Freq: Every day | ORAL | Status: DC

## 2012-08-17 NOTE — Telephone Encounter (Signed)
PT ordered.

## 2012-08-17 NOTE — Telephone Encounter (Signed)
PT INFORMED. Lillion Elbert,CMA

## 2012-08-21 ENCOUNTER — Telehealth: Payer: Self-pay | Admitting: *Deleted

## 2012-08-21 ENCOUNTER — Other Ambulatory Visit: Payer: Self-pay | Admitting: Family Medicine

## 2012-08-21 NOTE — Telephone Encounter (Signed)
I will write a letter recommending specific restrictions. If these restrictions cannot be met then she should be sent home.

## 2012-08-21 NOTE — Telephone Encounter (Signed)
Pt called & left message stating that she had to call out of work due to her back pain & wants to know if you can write a note for her. Please advise

## 2012-08-23 ENCOUNTER — Telehealth: Payer: Self-pay

## 2012-08-23 NOTE — Telephone Encounter (Signed)
I can't write a letter saying it is ok for her to be out of work on a weekend when she didn't even see me.  She may need to look into FMLA/short term disability if she cant work.

## 2012-08-23 NOTE — Telephone Encounter (Signed)
Pt called stated that her job will not allow her to be on restrictions, she missed work on 08/19/2012 she stated that she needs a letter to approve that absence. Rhonda Cunningham,CMA

## 2012-08-23 NOTE — Telephone Encounter (Signed)
Pt informed she would have to see about FMLA or short term disability in order to be out of work [employer]

## 2012-08-24 ENCOUNTER — Ambulatory Visit: Payer: BC Managed Care – PPO | Attending: Sports Medicine | Admitting: Physical Therapy

## 2012-08-24 DIAGNOSIS — M545 Low back pain, unspecified: Secondary | ICD-10-CM

## 2012-08-24 DIAGNOSIS — M2560 Stiffness of unspecified joint, not elsewhere classified: Secondary | ICD-10-CM

## 2012-09-05 ENCOUNTER — Ambulatory Visit: Payer: BC Managed Care – PPO | Admitting: Sports Medicine

## 2012-09-05 ENCOUNTER — Encounter: Payer: Self-pay | Admitting: Sports Medicine

## 2012-09-05 ENCOUNTER — Ambulatory Visit (INDEPENDENT_AMBULATORY_CARE_PROVIDER_SITE_OTHER): Payer: BC Managed Care – PPO | Admitting: Sports Medicine

## 2012-09-05 VITALS — BP 142/89 | HR 77 | Wt 149.0 lb

## 2012-09-05 DIAGNOSIS — M545 Low back pain, unspecified: Secondary | ICD-10-CM

## 2012-09-05 MED ORDER — KETOROLAC TROMETHAMINE 30 MG/ML IM SOLN: 30.0000 mg | Freq: Once | INTRAMUSCULAR | Status: DC

## 2012-09-05 NOTE — Progress Notes (Signed)
  Subjective:    CC: Followup  HPI: Low back pain: Mostly facetogenic with pain on extension and standing, better with sitting.  Overall improving significantly with physical therapy. She does desire to continue PT. And continued to improve on a daily basis, no radicular symptoms. Desires repeat Toradol shot, has only been using one half of the Mobic tablet.  Past medical history, Surgical history, Family history not pertinant except as noted below, Social history, Allergies, and medications have been entered into the medical record, reviewed, and no changes needed.   Review of Systems: No fevers, chills, night sweats, weight loss, chest pain, or shortness of breath.   Objective:    General: Well Developed, well nourished, and in no acute distress.  Neuro: Alert and oriented x3, extra-ocular muscles intact, sensation grossly intact.  HEENT: Normocephalic, atraumatic, pupils equal round reactive to light, neck supple, no masses, no lymphadenopathy, thyroid nonpalpable.  Skin: Warm and dry, no rashes. Cardiac: Regular rate and rhythm, no murmurs rubs or gallops, no lower extremity edema.  Respiratory: Clear to auscultation bilaterally. Not using accessory muscles, speaking in full sentences. Back Exam:  Inspection: Unremarkable  Motion: Flexion 45 deg, Extension 45 deg, Side Bending to 45 deg bilaterally,  Rotation to 45 deg bilaterally  SLR laying: Negative  XSLR laying: Negative  Palpable tenderness: Tender to palpation over the right L3-L4 as well as L4-L5 facet joints.. FABER: negative. Sensory change: Gross sensation intact to all lumbar and sacral dermatomes.  Reflexes: 2+ at both patellar tendons, 2+ at achilles tendons, Babinski's downgoing.  Strength at foot  Plantar-flexion: 5/5 Dorsi-flexion: 5/5 Eversion: 5/5 Inversion: 5/5  Leg strength  Quad: 5/5 Hamstring: 5/5 Hip flexor: 5/5 Hip abductors: 5/5  Gait unremarkable.  Impression and Recommendations:

## 2012-09-05 NOTE — Assessment & Plan Note (Signed)
Symptoms are classic right-sided facet syndrome. Based on her exam the L3-L4 as well as the L4-L5 facets are the most symptomatic. She does desire to continue conservative measures for now. We will continue formal PT, I will obtain an MRI of her lumbar spine in anticipation of facet injections. Like to see her back in a month, if no better we will pursue facet injections/blocks. Good response to facet injections and she does become a candidate for medial branch blocks and radiofrequency ablation.

## 2012-09-05 NOTE — Addendum Note (Signed)
Addended by: Pixie Casino on: 09/05/2012 03:20 PM   Modules accepted: Orders

## 2012-09-12 ENCOUNTER — Telehealth: Payer: Self-pay

## 2012-09-12 NOTE — Telephone Encounter (Signed)
Glendora Score @[facility] @ [phone] to get PA for MRI Lumbar Spine WO Contrast.  They will schedule VIP with [facility] which is covered at 100%.  Mendy Lapinsky,CMA

## 2012-10-05 ENCOUNTER — Ambulatory Visit: Payer: BC Managed Care – PPO | Admitting: Sports Medicine

## 2012-10-05 DIAGNOSIS — Z0289 Encounter for other administrative examinations: Secondary | ICD-10-CM

## 2012-10-27 ENCOUNTER — Other Ambulatory Visit: Payer: Self-pay | Admitting: Family Medicine

## 2012-10-27 DIAGNOSIS — Z139 Encounter for screening, unspecified: Secondary | ICD-10-CM

## 2012-10-31 ENCOUNTER — Ambulatory Visit: Payer: BC Managed Care – PPO

## 2012-11-01 ENCOUNTER — Encounter: Payer: Self-pay | Admitting: Family Medicine

## 2012-11-01 ENCOUNTER — Ambulatory Visit (INDEPENDENT_AMBULATORY_CARE_PROVIDER_SITE_OTHER): Payer: BC Managed Care – PPO | Admitting: Family Medicine

## 2012-11-01 VITALS — BP 122/89 | HR 82 | Temp 98.4°F | Wt 153.0 lb

## 2012-11-01 DIAGNOSIS — N390 Urinary tract infection, site not specified: Secondary | ICD-10-CM

## 2012-11-01 LAB — POCT URINALYSIS DIPSTICK
Glucose, UA: NEGATIVE
Nitrite, UA: NEGATIVE
Spec Grav, UA: 1.02
Urobilinogen, UA: 0.2
pH, UA: 6.5

## 2012-11-01 MED ORDER — SULFAMETHOXAZOLE-TRIMETHOPRIM 800-160 MG PO TABS: ORAL_TABLET | ORAL | Status: AC

## 2012-11-01 MED ORDER — FLUCONAZOLE 150 MG PO TABS: ORAL_TABLET | ORAL | Status: AC

## 2012-11-01 NOTE — Addendum Note (Signed)
Addended by: Wyline Beady on: 11/01/2012 05:04 PM   Modules accepted: Orders

## 2012-11-01 NOTE — Progress Notes (Signed)
CC: Rachel Mack is a 52 y.o. female is here for Urinary Tract Infection   Subjective: HPI:  Patient complains of vague low back pain separate from pain that she has been managing with Dr. Karie Schwalbe. in sports medicine along with end of urination dysuria that has been present since this morning symptoms are mild/moderate severity came on abruptly have been persistent since onset. Back pain is improved with urination pain is nonradiating. Symptoms are reported as feeling similar to prior urinary tract infections. She denies fevers, chills, flank pain, vaginal discharge, pelvic pain, nausea, vomiting no interventions as of yet  Review Of Systems Outlined In HPI  Past Medical History  Diagnosis Date  . Thyroid disease   . Depression   . Perimenopausal   . Vitamin B 12 deficiency   . Hypertension   . Nephrolithiasis      Family History  Problem Relation Age of Onset  . Cancer Mother   . Diabetes Mother   . Hyperlipidemia Mother   . Hypertension Mother   . Drug abuse Sister     died overdose/suicide  . Colon cancer       History  Substance Use Topics  . Smoking status: Former Smoker    Quit date: 02/23/2004  . Smokeless tobacco: Not on file  . Alcohol Use: No     Objective: Filed Vitals:   11/01/12 1501  BP: 122/89  Pulse: 82  Temp: 98.4 F (36.9 C)    Vital signs reviewed. General: Alert and Oriented, No Acute Distress HEENT: Pupils equal, round, reactive to light. Conjunctivae clear.  External ears unremarkable.  Moist mucous membranes. Lungs: Clear and comfortable work of breathing, speaking in full sentences without accessory muscle use. Cardiac: Regular rate and rhythm.  Back: No CVA tenderness Extremities: No peripheral edema.  Strong peripheral pulses.  Mental Status: No depression, anxiety, nor agitation. Logical though process. Skin: Warm and dry. Assessment & Plan: Rachel Mack was seen today for urinary tract infection.  Diagnoses and associated orders for  this visit:  UTI (urinary tract infection) - fluconazole (DIFLUCAN) 150 MG tablet; Take one tab, may take second tab if no improvement after 72 hours. - sulfamethoxazole-trimethoprim (SEPTRA DS) 800-160 MG per tablet; One tab twice a day for five days.    Urinalysis suspicious for UTI with large blood however she has had blood on negative cultures in the past.  Trace leukocytes. We'll start with Bactrim Diflucan if needed as she has a history of yeast infections after antibiotics. We'll follow culture  Return if symptoms worsen or fail to improve.

## 2012-11-02 ENCOUNTER — Ambulatory Visit: Payer: BC Managed Care – PPO

## 2012-11-02 ENCOUNTER — Ambulatory Visit (INDEPENDENT_AMBULATORY_CARE_PROVIDER_SITE_OTHER): Payer: BC Managed Care – PPO

## 2012-11-02 ENCOUNTER — Ambulatory Visit: Payer: BC Managed Care – PPO | Admitting: Family Medicine

## 2012-11-02 DIAGNOSIS — Z1231 Encounter for screening mammogram for malignant neoplasm of breast: Secondary | ICD-10-CM

## 2012-11-02 DIAGNOSIS — Z139 Encounter for screening, unspecified: Secondary | ICD-10-CM

## 2012-11-02 LAB — URINE CULTURE
Colony Count: NO GROWTH
Organism ID, Bacteria: NO GROWTH

## 2012-11-14 ENCOUNTER — Other Ambulatory Visit: Payer: Self-pay | Admitting: *Deleted

## 2012-11-14 DIAGNOSIS — E039 Hypothyroidism, unspecified: Secondary | ICD-10-CM

## 2012-11-14 MED ORDER — NORGESTIM-ETH ESTRAD TRIPHASIC 0.18/0.215/0.25 MG-25 MCG PO TABS: ORAL_TABLET | ORAL | Status: DC

## 2012-11-14 MED ORDER — HYDROCHLOROTHIAZIDE 12.5 MG PO CAPS: ORAL_CAPSULE | ORAL | Status: DC

## 2012-11-14 MED ORDER — SYNTHROID 50 MCG PO TABS: 50.0000 ug | ORAL_TABLET | Freq: Every day | ORAL | Status: DC

## 2012-12-14 ENCOUNTER — Encounter: Payer: BC Managed Care – PPO | Admitting: Family Medicine

## 2013-01-29 ENCOUNTER — Other Ambulatory Visit: Payer: Self-pay | Admitting: *Deleted

## 2013-01-29 DIAGNOSIS — Z0000 Encounter for general adult medical examination without abnormal findings: Secondary | ICD-10-CM

## 2013-01-29 LAB — COMPLETE METABOLIC PANEL WITH GFR
AST: 12 U/L (ref 0–37)
Albumin: 4.2 g/dL (ref 3.5–5.2)
CO2: 27 mEq/L (ref 19–32)
Calcium: 9.3 mg/dL (ref 8.4–10.5)
Chloride: 102 mEq/L (ref 96–112)
GFR, Est African American: >89 mL/min
GFR, Est Non African American: >89 mL/min
Glucose, Bld: 93 mg/dL (ref 70–99)
Potassium: 4.1 mEq/L (ref 3.5–5.3)
Sodium: 141 mEq/L (ref 135–145)
Total Protein: 7.2 g/dL (ref 6.0–8.3)

## 2013-01-29 LAB — LIPID PANEL
Cholesterol: 150 mg/dL (ref 0–200)
HDL: 49 mg/dL (ref >39)
LDL Cholesterol: 58 mg/dL (ref 0–99)

## 2013-02-02 ENCOUNTER — Encounter: Payer: BC Managed Care – PPO | Admitting: Family Medicine

## 2013-02-06 ENCOUNTER — Ambulatory Visit (INDEPENDENT_AMBULATORY_CARE_PROVIDER_SITE_OTHER): Payer: BC Managed Care – PPO | Admitting: Family Medicine

## 2013-02-06 ENCOUNTER — Encounter: Payer: Self-pay | Admitting: Family Medicine

## 2013-02-06 VITALS — BP 110/70 | HR 75 | Temp 97.8°F | Ht 64.6 in | Wt 150.0 lb

## 2013-02-06 DIAGNOSIS — N6459 Other signs and symptoms in breast: Secondary | ICD-10-CM

## 2013-02-06 DIAGNOSIS — E039 Hypothyroidism, unspecified: Secondary | ICD-10-CM

## 2013-02-06 DIAGNOSIS — Z0000 Encounter for general adult medical examination without abnormal findings: Secondary | ICD-10-CM

## 2013-02-06 HISTORY — DX: Other signs and symptoms in breast: N64.59

## 2013-02-06 MED ORDER — HYDROCHLOROTHIAZIDE 12.5 MG PO CAPS: ORAL_CAPSULE | ORAL | Status: DC

## 2013-02-06 MED ORDER — NORGESTIM-ETH ESTRAD TRIPHASIC 0.18/0.215/0.25 MG-25 MCG PO TABS: ORAL_TABLET | ORAL | Status: DC

## 2013-02-06 MED ORDER — SYNTHROID 50 MCG PO TABS: 50.0000 ug | ORAL_TABLET | Freq: Every day | ORAL | Status: DC

## 2013-02-06 NOTE — Patient Instructions (Addendum)
Think about colon cancer screening options.  Check with your insurance to see if they will cover the shingles vaccine. Let me know if you find out if her brother did have colon cancer and I will add this to your family history. Keep up a regular exercise program and make sure you are eating a healthy diet Try to eat 4 servings of dairy a day, or if you are lactose intolerant take a calcium with vitamin D daily.  Your vaccines are up to date.

## 2013-02-06 NOTE — Progress Notes (Signed)
  Subjective:     Rachel Mack is a 52 y.o. female and is here for a comprehensive physical exam. The patient reports no problems.  History   Social History  . Marital Status: Married    Spouse Name: N/A    Number of Children: 4  . 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 Smoking status: Former Smoker    Quit date: 02/23/2004  . Smokeless tobacco: Not on file  . Alcohol Use: No  . Drug Use: No  . Sexual Activity: Not on file     Comment: Works out regularly, married, 4 sons, youngest .   Other Topics Concern  . Not on file   Social History Narrative  . No narrative on file   Health Maintenance  Topic Date Due  . Mammogram  01/14/2013  . Colonoscopy  01/14/2013  . Influenza Vaccine  09/22/2013  . Pap Smear  04/30/2014  . Tetanus/tdap  12/27/2018    The following portions of the patient's history were reviewed and updated as appropriate: allergies, current medications, past family history, past medical history, past social history, past surgical history and problem list.  Review of Systems A comprehensive review of systems was negative.   Objective:    BP 110/70  Pulse 75  Temp(Src) 97.8 F (36.6 C)  Ht 5' 4.6" (1.641 m)  Wt 150 lb (68.04 kg)  BMI 25.27 kg/m2  LMP 10/22/2011 General appearance: alert, cooperative and appears stated age Head: Normocephalic, without obvious abnormality, atraumatic Eyes: conj clear, EOMi, PEERLA Ears: normal TM's and external ear canals both ears Nose: Nares normal. Septum midline. Mucosa normal. No drainage or sinus tenderness. Throat: lips, mucosa, and tongue normal; teeth and gums normal Neck: no adenopathy, no carotid bruit, no JVD, supple, symmetrical, trachea midline and thyroid not enlarged, symmetric, no tenderness/mass/nodules Back: symmetric, no curvature. ROM normal. No CVA tenderness. Lungs: clear to auscultation bilaterally Breasts: normal appearance, no  masses or tenderness, left nipple is inverted.  Heart: regular rate and rhythm, S1, S2 normal, no murmur, click, rub or gallop Abdomen: soft, non-tender; bowel sounds normal; no masses,  no organomegaly Extremities: extremities normal, atraumatic, no cyanosis or edema Pulses: 2+ and symmetric Skin: Skin color, texture, turgor normal. No rashes or lesions Lymph nodes: Cervical, supraclavicular, and axillary nodes normal. Neurologic: Alert and oriented X 3, normal strength and tone. Normal symmetric reflexes. Normal coordination and gait    Assessment:    Healthy female exam.      Plan:     See After Visit Summary for Counseling Recommendations  Keep up a regular exercise program and make sure you are eating a healthy diet Try to eat 4 servings of dairy a day, or if you are lactose intolerant take a calcium with vitamin D daily.  Your vaccines are up to date.  Discussed need for colon cancer screening since she is now 52. We discussed options. She thinks her brother may have had colon cancer but she percent sure. I encouraged her to think about it and let me know she would like to do. Handout given for shingles vaccine. She will check with her insurance to see if it's covered.  Hypothyroidism-taking medication regularly without any side effects or problems. No significant changes in weight, skin or hair. We'll repeat level. Refill sent to pharmacy.

## 2013-07-05 ENCOUNTER — Ambulatory Visit: Payer: BC Managed Care – PPO | Admitting: Family Medicine

## 2013-07-27 ENCOUNTER — Encounter: Payer: Self-pay | Admitting: Family Medicine

## 2013-07-27 ENCOUNTER — Ambulatory Visit (INDEPENDENT_AMBULATORY_CARE_PROVIDER_SITE_OTHER): Payer: BC Managed Care – PPO | Admitting: Family Medicine

## 2013-07-27 VITALS — BP 129/87 | HR 71 | Temp 97.8°F | Resp 16 | Ht 64.5 in | Wt 138.0 lb

## 2013-07-27 DIAGNOSIS — Z0000 Encounter for general adult medical examination without abnormal findings: Secondary | ICD-10-CM

## 2013-07-27 DIAGNOSIS — Z780 Asymptomatic menopausal state: Secondary | ICD-10-CM

## 2013-07-27 DIAGNOSIS — E538 Deficiency of other specified B group vitamins: Secondary | ICD-10-CM

## 2013-07-27 DIAGNOSIS — I10 Essential (primary) hypertension: Secondary | ICD-10-CM

## 2013-07-27 DIAGNOSIS — E039 Hypothyroidism, unspecified: Secondary | ICD-10-CM

## 2013-07-27 LAB — VITAMIN B12: VITAMIN B 12: 263 pg/mL (ref 211–911)

## 2013-07-27 NOTE — Assessment & Plan Note (Signed)
She is still contemplating whether/when to get screening colonoscopy. If still not ready to commit at next CPE in 6-9 mo's, will do iFOB. Last pap 04/2011 normal, neg for HR HPV, next pap due around 04/2014. Next mammogram due after 10/2013.

## 2013-07-27 NOTE — Assessment & Plan Note (Signed)
Last level 01/27/2012 a bit above normal, while on IM replacement. IM replacement was d/c'd at that time and she admits she felt better at that time regarding energy.  No vit B12 level since then. Recheck vit B12 level today.

## 2013-07-27 NOTE — Progress Notes (Signed)
Pre visit review using our clinic review tool, if applicable. No additional management support is needed unless otherwise documented below in the visit note. 

## 2013-07-27 NOTE — Assessment & Plan Note (Signed)
The current medical regimen is effective;  continue present plan and medications.  

## 2013-07-27 NOTE — Assessment & Plan Note (Signed)
Irrig menses helped by current OCP. Pt aware that use of these meds carries slightly higher risk of thromboembolism and she wants to continue.

## 2013-07-27 NOTE — Progress Notes (Signed)
Office Note 07/27/2013  CC:  Chief Complaint  Patient presents with  . Establish Care    moved to [facility], wanted PCP closer  . Insomnia    HPI:  Rachel Mack is a 53 y.o. White female who is here to establish care. Patient's most recent primary MD: Dr. Madilyn Fireman Old records in EPIC/[facility] EMR were reviewed prior to or during today's visit.  No acute complaints today. Compliant with meds, feeling pretty well.  Past Medical History  Diagnosis Date  . Hypothyroidism   . Perimenopausal     helped regulate menses  . Vitamin B 12 deficiency   . Hypertension   . Nephrolithiasis     nonobstructive; has never passed one    Past Surgical History  Procedure Laterality Date  . Gynecologic cryosurgery  2005 or before    cervicitis (Dr. Lissa Hoard in [place])    Family History  Problem Relation Age of Onset  . Breast cancer Mother 23    d age 45  . Diabetes Mother   . Hyperlipidemia Mother   . Hypertension Mother   . Drug abuse Sister   . Colon cancer Paternal Grandmother     History   Social History  . Marital Status: Married    Spouse Name: N/A    Number of Children: 4  . 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 Smoking status: Former Smoker    Quit date: 02/23/2004  . Smokeless tobacco: Never Used  . Alcohol Use: No  . Drug Use: No  . Sexual Activity: Not on file     Comment: Works out regularly, married, 4 sons, youngest .   Other Topics Concern  . Not on file   Social History Narrative   Married, 4 children (32y, 28y, 21y, [place]).   Orig from [place] in [place], currently lives in [place].   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   Cigs: none   Alc: occ.  No drugs.   Exercise: no   MEDS: ortho tri cyclin lo, synthroid 50 mcg qd, hctz 12.5mg  qd  Allergies  Allergen Reactions  . Amoxicillin     REACTION: yeast infection  . Bupropion Hcl     REACTION: heart palpitations    ROS Review  of Systems  Constitutional: Negative for fever and fatigue.  HENT: Negative for congestion and sore throat.   Eyes: Negative for visual disturbance.  Respiratory: Negative for cough.   Cardiovascular: Negative for chest pain.  Gastrointestinal: Negative for nausea and abdominal pain.  Genitourinary: Negative for dysuria.  Musculoskeletal: Positive for myalgias (nocturnal leg cramps). Negative for back pain and joint swelling.  Skin: Negative for rash.  Neurological: Negative for weakness and headaches.  Hematological: Negative for adenopathy.    PE; Blood pressure 129/87, pulse 71, temperature 97.8 F (36.6 C), temperature source Temporal, resp. rate 16, height 5' 4.5" (1.638 m), weight 138 lb (62.596 kg), last menstrual period 10/22/2011, SpO2 99.00%. Gen: Alert, well appearing.  Patient is oriented to person, place, time, and situation. BJS:EGBT: no injection, icteris, swelling, or exudate.  EOMI, PERRLA. Mouth: lips without lesion/swelling.  Oral mucosa pink and moist. Oropharynx without erythema, exudate, or swelling.  Neck - No masses or thyromegaly or limitation in range of motion CV: RRR, no m/r/g.   LUNGS: CTA bilat, nonlabored resps, good aeration in all lung fields. EXT: no clubbing, cyanosis, or  edema.   Pertinent labs:  None today  ASSESSMENT AND PLAN:   Essential hypertension, benign The current medical regimen is effective;  continue present plan and medications.   UNSPECIFIED HYPOTHYROIDISM The current medical regimen is effective;  continue present plan and medications.   B12 DEFICIENCY Last level 01/27/2012 a bit above normal, while on IM replacement. IM replacement was d/c'd at that time and she admits she felt better at that time regarding energy.  No vit B12 level since then. Recheck vit B12 level today.  PERIMENOPAUSAL STATUS Irrig menses helped by current OCP. Pt aware that use of these meds carries slightly higher risk of thromboembolism and she  wants to continue.  Preventative health care She is still contemplating whether/when to get screening colonoscopy. If still not ready to commit at next CPE in 6-9 mo's, will do iFOB. Last pap 04/2011 normal, neg for HR HPV, next pap due around 04/2014. Next mammogram due after 10/2013.  An After Visit Summary was printed and given to the patient.  Return for 6-9 mo for CPE with pap.

## 2013-07-30 ENCOUNTER — Telehealth: Payer: Self-pay | Admitting: Family Medicine

## 2013-07-30 NOTE — Addendum Note (Signed)
Addended by: Jannette Spanner on: 07/30/2013 09:11 AM   Modules accepted: Orders

## 2013-07-30 NOTE — Telephone Encounter (Signed)
Relevant patient education assigned to patient using Emmi. ° °

## 2013-08-06 ENCOUNTER — Other Ambulatory Visit: Payer: Self-pay | Admitting: Family Medicine

## 2013-08-06 MED ORDER — CYANOCOBALAMIN 1000 MCG/ML IJ SOLN: 1000.0000 ug | Freq: Once | INTRAMUSCULAR | Status: DC

## 2013-10-04 ENCOUNTER — Ambulatory Visit: Payer: BC Managed Care – PPO | Admitting: Family Medicine

## 2013-10-11 ENCOUNTER — Ambulatory Visit (INDEPENDENT_AMBULATORY_CARE_PROVIDER_SITE_OTHER): Payer: BC Managed Care – PPO | Admitting: Family Medicine

## 2013-10-11 ENCOUNTER — Encounter: Payer: Self-pay | Admitting: Family Medicine

## 2013-10-11 ENCOUNTER — Other Ambulatory Visit (HOSPITAL_COMMUNITY)
Admission: RE | Admit: 2013-10-11 | Discharge: 2013-10-11 | Disposition: A | Discharge status: Home / Self Care | Payer: BC Managed Care – PPO | Source: Ambulatory Visit | Attending: Family Medicine | Admitting: Family Medicine

## 2013-10-11 VITALS — BP 113/76 | HR 77 | Ht 64.6 in | Wt 137.0 lb

## 2013-10-11 DIAGNOSIS — Z01419 Encounter for gynecological examination (general) (routine) without abnormal findings: Secondary | ICD-10-CM | POA: Diagnosis present

## 2013-10-11 DIAGNOSIS — M2550 Pain in unspecified joint: Secondary | ICD-10-CM

## 2013-10-11 DIAGNOSIS — Z1231 Encounter for screening mammogram for malignant neoplasm of breast: Secondary | ICD-10-CM

## 2013-10-11 DIAGNOSIS — Z8269 Family history of other diseases of the musculoskeletal system and connective tissue: Secondary | ICD-10-CM

## 2013-10-11 NOTE — Progress Notes (Signed)
   Subjective:    Patient ID: Rachel Mack, female    DOB: [birth year], 53 y.o.   MRN: [id number]  Gynecologic Exam   Patient is here today for Pap smear. She is Re: had a wellness visit with her prior PCP but does need an up-to-date Pap smear. She has received a reminder card for her mammogram but has not scheduled it yet. She did discuss the need for colonoscopy with her PCP because she's not quite ready to do it yet. She denies any pelvic or Donnell pain. No abnormal discharge. She does get some spotting after intercourse.   Also c/o of joint pain.  She has noticed some occasional swelling in her hands. She is concerned because she says she just hurts all over opportunities and lower extremities. She even hurts over her shins and forearms as well as her knees hips low back and hands. Her father has rheumatoid arthritis and she wonders if she could be developing rheumatoid arthritis. She's not really had any significant slowing of other joints except for her middle finger on her. on one of her hands. She's also wondering if she could have fibromyalgia.   Review of Systems     Objective:   Physical Exam  Genitourinary: Rectum normal, vagina normal and uterus normal. No labial fusion. There is no rash, tenderness, lesion or injury on the right labia. There is no rash, tenderness, lesion or injury on the left labia. Cervix exhibits friability. Cervix exhibits no motion tenderness and no discharge. Right adnexum displays no mass, no tenderness and no fullness. Left adnexum displays no mass, no tenderness and no fullness.  Musculoskeletal:  She does have some mild swelling over the third PIP on the left hand. No other significant swelling over the hand joints or wrists. No swelling of the knees elbows or shoulders today.          Assessment & Plan:  Pap smear performed today-will call with results once available.  Polyarthralgia-she only has one particular joint that seems to swell and  her hands but certainly with his family history it may be worth evaluating for possible rheumatoid arthritis. We will do some additional blood work as well as plain film x-rays of the hands today. As far as the shin pain and form pain is concerned this can often be a sign of low vitamin D level so we will see if she might be vitamin D deficient. She plans on getting her other labs today so we will add this to her workup.   Due for mammo. Order placed.

## 2013-10-12 LAB — CBC WITH DIFFERENTIAL/PLATELET
BASOS ABS: 0 10*3/uL (ref 0.0–0.1)
BASOS PCT: 0 % (ref 0–1)
EOS ABS: 0.2 10*3/uL (ref 0.0–0.7)
Eosinophils Relative: 3 % (ref 0–5)
HCT: 39.5 % (ref 36.0–46.0)
Hemoglobin: 13.5 g/dL (ref 12.0–15.0)
Lymphocytes Relative: 30 % (ref 12–46)
Lymphs Abs: 1.9 10*3/uL (ref 0.7–4.0)
MCH: 32.1 pg (ref 26.0–34.0)
MCHC: 34.2 g/dL (ref 30.0–36.0)
MCV: 94 fL (ref 78.0–100.0)
Monocytes Absolute: 0.3 10*3/uL (ref 0.1–1.0)
Monocytes Relative: 5 % (ref 3–12)
NEUTROS PCT: 62 % (ref 43–77)
Neutro Abs: 4 10*3/uL (ref 1.7–7.7)
PLATELETS: 279 10*3/uL (ref 150–400)
RBC: 4.2 MIL/uL (ref 3.87–5.11)
RDW: 13 % (ref 11.5–15.5)
WBC: 6.4 10*3/uL (ref 4.0–10.5)

## 2013-10-12 LAB — RHEUMATOID FACTOR

## 2013-10-12 LAB — ANA: ANA: NEGATIVE

## 2013-10-12 LAB — VITAMIN D 25 HYDROXY (VIT D DEFICIENCY, FRACTURES): Vit D, 25-Hydroxy: 59 ng/mL (ref 30–89)

## 2013-10-12 LAB — URIC ACID: URIC ACID, SERUM: 5.4 mg/dL (ref 2.4–7.0)

## 2013-10-12 LAB — SEDIMENTATION RATE: Sed Rate: 7 mm/hr (ref 0–22)

## 2013-10-12 LAB — CYCLIC CITRUL PEPTIDE ANTIBODY, IGG: Cyclic Citrullin Peptide Ab: <2 U/mL (ref 0.0–5.0)

## 2013-10-12 LAB — CYTOLOGY - PAP

## 2013-10-12 NOTE — Progress Notes (Signed)
Quick Note:  Call patient: Your Pap smear is normal. Repeat in 2-3 years. ______ 

## 2013-11-06 ENCOUNTER — Other Ambulatory Visit: Payer: Self-pay

## 2013-11-06 MED ORDER — NORGESTIM-ETH ESTRAD TRIPHASIC 0.18/0.215/0.25 MG-25 MCG PO TABS: ORAL_TABLET | ORAL | Status: DC

## 2013-11-22 ENCOUNTER — Ambulatory Visit (INDEPENDENT_AMBULATORY_CARE_PROVIDER_SITE_OTHER): Payer: BC Managed Care – PPO

## 2013-11-22 DIAGNOSIS — Z1231 Encounter for screening mammogram for malignant neoplasm of breast: Secondary | ICD-10-CM

## 2013-11-28 ENCOUNTER — Other Ambulatory Visit: Payer: Self-pay | Admitting: *Deleted

## 2013-11-28 DIAGNOSIS — E039 Hypothyroidism, unspecified: Secondary | ICD-10-CM

## 2013-11-28 MED ORDER — SYNTHROID 50 MCG PO TABS: 50.0000 ug | ORAL_TABLET | Freq: Every day | ORAL | Status: DC

## 2013-11-28 MED ORDER — HYDROCHLOROTHIAZIDE 12.5 MG PO CAPS: ORAL_CAPSULE | ORAL | Status: DC

## 2014-01-23 ENCOUNTER — Other Ambulatory Visit: Payer: Self-pay

## 2014-01-23 MED ORDER — CYANOCOBALAMIN 1000 MCG/ML IJ SOLN: 1000.0000 ug | Freq: Once | INTRAMUSCULAR | Status: DC

## 2014-03-14 ENCOUNTER — Encounter: Payer: BC Managed Care – PPO | Admitting: Family Medicine

## 2014-03-15 ENCOUNTER — Encounter: Payer: Self-pay | Admitting: Family Medicine

## 2014-04-16 ENCOUNTER — Ambulatory Visit (INDEPENDENT_AMBULATORY_CARE_PROVIDER_SITE_OTHER): Payer: BLUE CROSS/BLUE SHIELD | Admitting: Family Medicine

## 2014-04-16 ENCOUNTER — Telehealth: Payer: Self-pay | Admitting: Family Medicine

## 2014-04-16 ENCOUNTER — Encounter: Payer: Self-pay | Admitting: Family Medicine

## 2014-04-16 VITALS — BP 111/64 | HR 71 | Ht 64.5 in | Wt 138.0 lb

## 2014-04-16 DIAGNOSIS — Z0000 Encounter for general adult medical examination without abnormal findings: Secondary | ICD-10-CM | POA: Diagnosis not present

## 2014-04-16 DIAGNOSIS — D510 Vitamin B12 deficiency anemia due to intrinsic factor deficiency: Secondary | ICD-10-CM

## 2014-04-16 NOTE — Progress Notes (Addendum)
Subjective:     Rachel Mack is a 54 y.o. female and is here for a comprehensive physical exam. The patient reports problems - pain over the entire shin for months.  Says worse with waling . Can happen within a few minute of walking.  Says bothers her more at night.  Pain only on the right leg. No trauma.   History   Social History  . Marital Status: Married    Spouse Name: N/A  . Number of Children: 4  . 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 Smoking status: Former Smoker    Quit date: 02/23/2004  . Smokeless tobacco: Never Used  . Alcohol Use: No  . Drug Use: No  . Sexual Activity: Not on file     Comment: Works out regularly, married, 4 sons, youngest .   Other Topics Concern  . Not on file   Social History Narrative   Married, 4 children (32y, 28y, 21y, [place]).   Orig from [place] in [place], currently lives in [place].   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   Cigs: none   Alc: occ.  No drugs.   Exercise: no   Health Maintenance  Topic Date Due  . HIV Screening  01/14/1978  . COLONOSCOPY  01/14/2013  . INFLUENZA VACCINE  09/23/2014  . MAMMOGRAM  11/23/2015  . PAP SMEAR  10/11/2016  . TETANUS/TDAP  12/27/2018    The following portions of the patient's history were reviewed and updated as appropriate: allergies, current medications, past family history, past medical history, past social history, past surgical history and problem list.  Review of Systems A comprehensive review of systems was negative.   Objective:    BP 111/64 mmHg  Pulse 71  Ht 5' 4.5" (1.638 m)  Wt 138 lb (62.596 kg)  BMI 23.33 kg/m2  LMP 10/22/2011 General appearance: alert, cooperative and appears stated age Head: Normocephalic, without obvious abnormality, atraumatic Eyes: conj clear, EOMi, PEERLA Ears: normal TM's and external ear canals both ears Nose: Nares normal. Septum midline. Mucosa normal. No drainage or sinus  tenderness. Throat: lips, mucosa, and tongue normal; teeth and gums normal Neck: no adenopathy, no carotid bruit, no JVD, supple, symmetrical, trachea midline and thyroid not enlarged, symmetric, no tenderness/mass/nodules Back: symmetric, no curvature. ROM normal. No CVA tenderness. Lungs: clear to auscultation bilaterally Heart: regular rate and rhythm, S1, S2 normal, no murmur, click, rub or gallop Abdomen: soft, non-tender; bowel sounds normal; no masses,  no organomegaly Extremities: extremities normal, atraumatic, no cyanosis or edema Pulses: 2+ and symmetric Skin: Skin color, texture, turgor normal. No rashes or lesions Lymph nodes: Cervical, supraclavicular, and axillary nodes normal. Neurologic: Alert and oriented X 3, normal strength and tone. Normal symmetric reflexes. Normal coordination and gait    Mild tenderness over the left shin. No pinpoint tenderness.    Assessment:    Healthy female exam.      Plan:     See After Visit Summary for Counseling Recommendations   Keep up a regular exercise program and make sure you are eating a healthy diet Try to eat 4 servings of dairy a day, or if you are lactose intolerant take a calcium with vitamin D daily.  Your vaccines are up to date.  Due for screening colonoscopy. Dur for CMP and lipids.   Left shin pain - unlikely to be shin splints since  she has not had any change or increase in activity level to trigger this and has been going on for months. She did try seeing a chiropractor but this was not helpful. Consider referral to sport med.

## 2014-04-16 NOTE — Telephone Encounter (Signed)
Please call patient and let her know that I did speak with her sports medicine doctor. He said that she's fairly flat-footed sometimes putting scaphoid pads in tissues can help correct a little bit of extra strain on the tibia. If she's really not very flat-footed then I would recommend that she make an appointment with her sports med.for further evaluation.

## 2014-04-16 NOTE — Telephone Encounter (Signed)
lvm for return call.Rachel Mack

## 2014-04-17 LAB — COMPLETE METABOLIC PANEL WITH GFR
ALBUMIN: 4.5 g/dL (ref 3.5–5.2)
ALT: 12 U/L (ref 0–35)
AST: 13 U/L (ref 0–37)
Alkaline Phosphatase: 30 U/L — ABNORMAL LOW (ref 39–117)
BUN: 11 mg/dL (ref 6–23)
CHLORIDE: 103 meq/L (ref 96–112)
CO2: 26 meq/L (ref 19–32)
Calcium: 9.2 mg/dL (ref 8.4–10.5)
Creat: 0.65 mg/dL (ref 0.50–1.10)
GLUCOSE: 93 mg/dL (ref 70–99)
Potassium: 4.1 mEq/L (ref 3.5–5.3)
Sodium: 139 mEq/L (ref 135–145)
Total Bilirubin: 0.6 mg/dL (ref 0.2–1.2)
Total Protein: 7 g/dL (ref 6.0–8.3)

## 2014-04-17 LAB — TSH: TSH: 1.566 u[IU]/mL (ref 0.350–4.500)

## 2014-04-17 LAB — LIPID PANEL
CHOL/HDL RATIO: 2.7 ratio
CHOLESTEROL: 153 mg/dL (ref 0–200)
HDL: 56 mg/dL (ref >=46)
LDL Cholesterol: 72 mg/dL (ref 0–99)
Triglycerides: 124 mg/dL (ref <150)
VLDL: 25 mg/dL (ref 0–40)

## 2014-04-17 LAB — VITAMIN B12: VITAMIN B 12: 389 pg/mL (ref 211–911)

## 2014-04-17 NOTE — Telephone Encounter (Signed)
Pt stated that she is not flat footed and doesn't feel that this would help at this time.Rachel Mack [place]

## 2014-04-26 ENCOUNTER — Telehealth: Payer: Self-pay

## 2014-04-26 NOTE — Telephone Encounter (Signed)
Ok, sounds OK to try it. If not helping then lets consider having her see gyn for her hotflashes.

## 2014-04-26 NOTE — Telephone Encounter (Signed)
Rachel Mack called and asked if she could take [name] for her hot flashes while on birth control pills. I spoke with Pam from the pharmacy at [facility]. She did not see any interactions with the two medications. She only noted a possible increased chance of breast cancer, low blood pressure and abnormal heart rate.

## 2014-04-29 NOTE — Telephone Encounter (Signed)
Spoke with Rachel Mack and informed her it was ok to take the estroven but if it did not help she should consider seeing a gyn. - CF

## 2014-05-03 ENCOUNTER — Telehealth: Payer: Self-pay | Admitting: *Deleted

## 2014-05-03 NOTE — Telephone Encounter (Signed)
Pt called and wanted to make Dr. Madilyn Fireman aware that she is going to dc her birth control.Rachel Mack

## 2014-05-06 ENCOUNTER — Telehealth: Payer: Self-pay

## 2014-05-06 NOTE — Telephone Encounter (Signed)
I spoke with Rachel Mack from [facility] and he said to Send a letter of medical Necessity we can mail it to  [place] [id number] [address]  [place], [place] [address]   Or fax to [phone] -CF

## 2014-05-06 NOTE — Telephone Encounter (Signed)
Brand Synthroid cost 50 dollars for a 90 day refill. Patient needs [place] to call insurance company to get a approval for brand name.

## 2014-05-08 NOTE — Telephone Encounter (Signed)
Wrote letter for medical necessity and placed in Dr. Gardiner Ramus box for a signature will fax to [place]

## 2014-05-10 ENCOUNTER — Other Ambulatory Visit: Payer: Self-pay

## 2014-05-10 DIAGNOSIS — E039 Hypothyroidism, unspecified: Secondary | ICD-10-CM

## 2014-05-10 MED ORDER — SYNTHROID 50 MCG PO TABS: 50.0000 ug | ORAL_TABLET | Freq: Every day | ORAL | Status: DC

## 2014-05-10 NOTE — Telephone Encounter (Signed)
Sent in 30 day until insurance responds to the appeal.

## 2014-05-13 NOTE — Telephone Encounter (Signed)
Received fax from [place] and they have denied a lower co pay for the synthroid they stated patient is paying lowest co pay for requested drug according to prescription benefit plan. - CF

## 2014-05-16 NOTE — Telephone Encounter (Signed)
Heard back from insurance, Pt has the lowest possible co-pay. Contacted Pt to inform, states she [facility] so she will just continue getting it from there from now on. No further questions.

## 2014-06-04 ENCOUNTER — Telehealth: Payer: Self-pay | Admitting: *Deleted

## 2014-06-04 DIAGNOSIS — Z1211 Encounter for screening for malignant neoplasm of colon: Secondary | ICD-10-CM

## 2014-06-04 NOTE — Telephone Encounter (Signed)
Referral placed.Rachel Mack  

## 2014-06-05 ENCOUNTER — Encounter: Payer: Self-pay | Admitting: Physician Assistant

## 2014-06-05 ENCOUNTER — Ambulatory Visit (INDEPENDENT_AMBULATORY_CARE_PROVIDER_SITE_OTHER): Payer: BLUE CROSS/BLUE SHIELD | Admitting: Physician Assistant

## 2014-06-05 VITALS — BP 124/73 | HR 80 | Wt 141.0 lb

## 2014-06-05 DIAGNOSIS — R319 Hematuria, unspecified: Secondary | ICD-10-CM | POA: Diagnosis not present

## 2014-06-05 DIAGNOSIS — N390 Urinary tract infection, site not specified: Secondary | ICD-10-CM | POA: Insufficient documentation

## 2014-06-05 LAB — POCT URINALYSIS DIPSTICK
Bilirubin, UA: NEGATIVE
Glucose, UA: NEGATIVE
Ketones, UA: NEGATIVE
Nitrite, UA: NEGATIVE
PH UA: 7.5
Spec Grav, UA: 1.015
Urobilinogen, UA: 0.2

## 2014-06-05 MED ORDER — FLUCONAZOLE 150 MG PO TABS: 150.0000 mg | ORAL_TABLET | Freq: Once | ORAL | Status: DC

## 2014-06-05 MED ORDER — NITROFURANTOIN MONOHYD MACRO 100 MG PO CAPS: 100.0000 mg | ORAL_CAPSULE | Freq: Two times a day (BID) | ORAL | Status: DC

## 2014-06-05 NOTE — Progress Notes (Signed)
   Subjective:    Patient ID: Rachel Mack, female    DOB: [birth year], 54 y.o.   MRN: [id number]  HPI Pt is a 54 yo female who presents to the clinic with 3 days of dysuria, increased urinary frequency, increased urinary urgency. Not tried anything. Hx of UTI's last one last year. No fever, chills, n/v/d. Mild lower abdominal pain. No flank pain.    Review of Systems  All other systems reviewed and are negative.      Objective:   Physical Exam  Constitutional: She is oriented to person, place, and time. She appears well-developed and well-nourished.  Cardiovascular: Normal rate, regular rhythm and normal heart sounds.   Pulmonary/Chest: Effort normal and breath sounds normal. She has no wheezes.  No cva tenderness.   Abdominal:  Mild diffuse suprapubic discomfort.   Neurological: She is alert and oriented to person, place, and time.  Psychiatric: She has a normal mood and affect. Her behavior is normal.          Assessment & Plan:  UTI- .Marland Kitchen Results for orders placed or performed in visit on 06/05/14  POCT urinalysis dipstick  Result Value Ref Range   Color, UA yellow    Clarity, UA clear    Glucose, UA negative    Bilirubin, UA negative    Ketones, UA negative    Spec Grav, UA 1.015    Blood, UA moderate    pH, UA 7.5    Protein, UA trace    Urobilinogen, UA 0.2    Nitrite, UA negative    Leukocytes, UA small (1+)    Will culture to make sure macrobid is sensitive. macrobid for 7 days. Diflucan given due to hx of yeast after abx. Increase hydration. Written out of work for today. Follow up if not improving.

## 2014-06-08 LAB — URINE CULTURE

## 2014-06-10 ENCOUNTER — Other Ambulatory Visit: Payer: Self-pay | Admitting: Family Medicine

## 2014-07-12 ENCOUNTER — Other Ambulatory Visit: Payer: Self-pay | Admitting: Family Medicine

## 2014-08-05 ENCOUNTER — Encounter: Payer: Self-pay | Admitting: Family Medicine

## 2014-08-05 ENCOUNTER — Ambulatory Visit (INDEPENDENT_AMBULATORY_CARE_PROVIDER_SITE_OTHER): Payer: BLUE CROSS/BLUE SHIELD | Admitting: Family Medicine

## 2014-08-05 VITALS — BP 117/78 | HR 72 | Wt 138.0 lb

## 2014-08-05 DIAGNOSIS — M25552 Pain in left hip: Secondary | ICD-10-CM

## 2014-08-05 DIAGNOSIS — M25551 Pain in right hip: Secondary | ICD-10-CM

## 2014-08-05 DIAGNOSIS — M79662 Pain in left lower leg: Secondary | ICD-10-CM | POA: Diagnosis not present

## 2014-08-05 DIAGNOSIS — G4700 Insomnia, unspecified: Secondary | ICD-10-CM | POA: Diagnosis not present

## 2014-08-05 MED ORDER — KETOROLAC TROMETHAMINE 60 MG/2ML IM SOLN
60.0000 mg | Freq: Once | INTRAMUSCULAR | Status: AC
  Administered 2014-08-05: 60 mg via INTRAMUSCULAR

## 2014-08-05 NOTE — Patient Instructions (Signed)
For the left shin pain - might really benefit from custom orthotics for this.

## 2014-08-05 NOTE — Progress Notes (Signed)
   Subjective:    Patient ID: Rachel Mack, female    DOB: March 27, 1960, 54 y.o.   MRN: [id number]  HPI  Bilateral hip pain  For several months.  No injury.  Uncomfortable to sleep on at night.  Feels a catch in the left hip.  Feels it anterior hip pain;. Says walks a lot at work.  Worse after sitting for awhile and then gets up.  IBU helps for a few hours.  Knees hurt at times as well. No sweliing. L\  Left shin pain - she still had pain on and off in her left shin. In fact I think we checked a vitamin D level about a year ago that was normal.  Insomnia - tried one of her sister's xanax 0.25mg  for 2 nights and it really helped her sleep well. Wants to know if she can have a rx for it.    Review of Systems     Objective:   Physical Exam  Constitutional: She appears well-developed and well-nourished.  HENT:  Head: Normocephalic and atraumatic.  Musculoskeletal:  She is very tender over the right trochanteric bursa. Hip abduction strength is normal. The left trochanteric bursa is less tender but hip abduction strength is definitely weaker on the left. She does have some pain with external rotation of the left hip. None with internal rotation. No pain on the right with internal or external rotation. Hip, knee, ankle strength with flexion extension is 5 out of 5 bilaterally. Patellar reflexes 1+ bilaterally.  Skin: Skin is warm and dry.  Psychiatric: She has a normal mood and affect. Her behavior is normal.          Assessment & Plan:  Bilateral hip pain- Suspect right hip bursits.  Left hip abductors are weak. Work on leg lifts. Also consider OA versus labral tear in the left hip. Given exercises.Order for xray placed. She can go anytime. With the catching she is getting may have a labral tear on the left hip as well. Consider MRI if not improving with exercises after 3-4 weeks and if xrays are negative   Left shin pain - consider custom orthotics. Most consistent with shin  splints.  Insomnia - discussed risks and benefits of  Benzo. She decided to hold off and maybe try Advil PM instead.

## 2014-08-29 ENCOUNTER — Telehealth: Payer: Self-pay | Admitting: *Deleted

## 2014-08-29 NOTE — Telephone Encounter (Signed)
Pt called and [facility] [place]. She stated that this was discussed at her last OV and has decided to proceed with getting an rx for this. She was taking 0.25 mg. Will fwd to pcp for advice.Rachel Mack [place]

## 2014-08-30 MED ORDER — ALPRAZOLAM 0.25 MG PO TABS: 0.2500 mg | ORAL_TABLET | Freq: Every evening | ORAL | Status: DC | PRN

## 2014-08-30 NOTE — Telephone Encounter (Signed)
Ok, rx sent

## 2014-08-30 NOTE — Telephone Encounter (Signed)
Pt informed.Rachel Mack [place]  

## 2014-10-09 ENCOUNTER — Ambulatory Visit (INDEPENDENT_AMBULATORY_CARE_PROVIDER_SITE_OTHER): Payer: BLUE CROSS/BLUE SHIELD

## 2014-10-09 ENCOUNTER — Ambulatory Visit (INDEPENDENT_AMBULATORY_CARE_PROVIDER_SITE_OTHER): Payer: BLUE CROSS/BLUE SHIELD | Admitting: Family Medicine

## 2014-10-09 ENCOUNTER — Encounter: Payer: Self-pay | Admitting: Family Medicine

## 2014-10-09 VITALS — BP 121/81 | HR 82 | Wt 140.0 lb

## 2014-10-09 DIAGNOSIS — M25552 Pain in left hip: Secondary | ICD-10-CM | POA: Insufficient documentation

## 2014-10-09 DIAGNOSIS — M79675 Pain in left toe(s): Secondary | ICD-10-CM

## 2014-10-09 DIAGNOSIS — M25551 Pain in right hip: Secondary | ICD-10-CM | POA: Insufficient documentation

## 2014-10-09 DIAGNOSIS — M79672 Pain in left foot: Secondary | ICD-10-CM | POA: Diagnosis not present

## 2014-10-09 DIAGNOSIS — M7989 Other specified soft tissue disorders: Secondary | ICD-10-CM

## 2014-10-09 DIAGNOSIS — M7732 Calcaneal spur, left foot: Secondary | ICD-10-CM | POA: Diagnosis not present

## 2014-10-09 NOTE — Patient Instructions (Signed)
Thank you for coming in today. Return in a few weeks.  I will call with results.

## 2014-10-09 NOTE — Progress Notes (Signed)
   Subjective:    I'm seeing this patient as a consultation for:  Dr. Madilyn Fireman  CC:  Left leg pain and left foot swelling  HPI: Patient notes foot pain and anterior leg pain.  1) left anterior leg pain. Symptoms present for months. Pain is located in the groin and is worse with hip motion. She denies any significant pain radiating down her leg. No weakness or numbness or loss of function. She's tried some over-the-counter medicines which have helped. She has not had an evaluation for this problem yet. She denies any injury.   2) foot swelling: Present for a few days. She denies any injury. She denies significant pain. No leg swelling weakness or numbness or loss of function. No treatments tried yet. No chest pains or palpitations  Past medical history, Surgical history, Family history not pertinant except as noted below, Social history, Allergies, and medications have been entered into the medical record, reviewed, and no changes needed.   Review of Systems: No headache, visual changes, nausea, vomiting, diarrhea, constipation, dizziness, abdominal pain, skin rash, fevers, chills, night sweats, weight loss, swollen lymph nodes, body aches, joint swelling, muscle aches, chest pain, shortness of breath, mood changes, visual or auditory hallucinations.   Objective:    Filed Vitals:   10/09/14 1315  BP: 121/81  Pulse: 82   General: Well Developed, well nourished, and in no acute distress.  Neuro/Psych: Alert and oriented x3, extra-ocular muscles intact, able to move all 4 extremities, sensation grossly intact. Skin: Warm and dry, no rashes noted.  Respiratory: Not using accessory muscles, speaking in full sentences, trachea midline.  Cardiovascular: Pulses palpable, no extremity edema. Abdomen: Does not appear distended. MSK: Hips normal appearing bilaterally nontender. Left hip impaired rotational internal range of motion by 10. Pain with rotation. Normal external rotation. Positive  FADIR test  Right hip normal-appearing nontender impaired internal rotational range of motion 10. Normal external rotation. Negative FADIR test Negative Corky Sox test bilaterally These are normal appearing bilaterally nontender with normal motions daily with exam Ankles normal-appearing nontender no swelling normal motion stable ligaments exam Feet normal-appearing bilaterally. No significant swelling either side. Normal pulses capillary refill sensation. Nontender.  No results found for this or any previous visit (from the past 24 hour(s)). No results found.  Impression and Recommendations:   This case required medical decision making of moderate complexity.

## 2014-10-09 NOTE — Assessment & Plan Note (Signed)
Foot swelling. Unclear etiology. X-ray of the foot pending. D-dimer and CBC also pending.

## 2014-10-09 NOTE — Assessment & Plan Note (Signed)
Left hip pain likely osteoarthritis based on pain with internal range of motion. Obtain x-ray. Trial of NSAIDs. Return to clinic in a few weeks

## 2014-10-10 LAB — CBC
HCT: 40.7 % (ref 36.0–46.0)
HEMOGLOBIN: 13.8 g/dL (ref 12.0–15.0)
MCH: 32.4 pg (ref 26.0–34.0)
MCHC: 33.9 g/dL (ref 30.0–36.0)
MCV: 95.5 fL (ref 78.0–100.0)
MPV: 11 fL (ref 8.6–12.4)
Platelets: 239 10*3/uL (ref 150–400)
RBC: 4.26 MIL/uL (ref 3.87–5.11)
RDW: 13.1 % (ref 11.5–15.5)
WBC: 5.3 10*3/uL (ref 4.0–10.5)

## 2014-10-10 LAB — D-DIMER, QUANTITATIVE: D-Dimer, Quant: 0.22 ug/mL-FEU (ref 0.00–0.48)

## 2014-10-10 NOTE — Progress Notes (Signed)
Quick Note:  Normal, no changes. ______ 

## 2014-10-14 ENCOUNTER — Other Ambulatory Visit: Payer: Self-pay | Admitting: Family Medicine

## 2014-10-14 MED ORDER — CYANOCOBALAMIN 1000 MCG/ML IJ SOLN: 1000.0000 ug | Freq: Once | INTRAMUSCULAR | Status: DC

## 2014-10-14 MED ORDER — HYDROCHLOROTHIAZIDE 12.5 MG PO CAPS: ORAL_CAPSULE | ORAL | Status: DC

## 2014-10-15 ENCOUNTER — Other Ambulatory Visit: Payer: Self-pay | Admitting: Family Medicine

## 2014-10-15 MED ORDER — CYANOCOBALAMIN 1000 MCG/ML IJ SOLN: 1000.0000 ug | Freq: Once | INTRAMUSCULAR | Status: DC

## 2014-10-15 MED ORDER — HYDROCHLOROTHIAZIDE 12.5 MG PO CAPS: ORAL_CAPSULE | ORAL | Status: DC

## 2014-10-15 NOTE — Telephone Encounter (Signed)
Requested Rx's go to mail order.

## 2014-10-30 ENCOUNTER — Telehealth: Payer: Self-pay | Admitting: Family Medicine

## 2014-10-30 DIAGNOSIS — Z1231 Encounter for screening mammogram for malignant neoplasm of breast: Secondary | ICD-10-CM

## 2014-10-30 NOTE — Telephone Encounter (Signed)
Pt called. She wants referral to have mammogram done. Thank you.

## 2014-10-30 NOTE — Telephone Encounter (Signed)
Pt called and informed.Rachel Mack [place]

## 2014-11-04 ENCOUNTER — Other Ambulatory Visit: Payer: Self-pay | Admitting: Family Medicine

## 2014-11-19 ENCOUNTER — Ambulatory Visit: Payer: BLUE CROSS/BLUE SHIELD | Admitting: Family Medicine

## 2014-11-21 ENCOUNTER — Ambulatory Visit: Payer: BLUE CROSS/BLUE SHIELD | Admitting: Family Medicine

## 2014-12-05 ENCOUNTER — Ambulatory Visit: Payer: BLUE CROSS/BLUE SHIELD

## 2014-12-05 ENCOUNTER — Ambulatory Visit (INDEPENDENT_AMBULATORY_CARE_PROVIDER_SITE_OTHER): Payer: BLUE CROSS/BLUE SHIELD

## 2014-12-05 DIAGNOSIS — Z1231 Encounter for screening mammogram for malignant neoplasm of breast: Secondary | ICD-10-CM

## 2015-01-06 ENCOUNTER — Ambulatory Visit: Payer: BLUE CROSS/BLUE SHIELD | Admitting: Physician Assistant

## 2015-01-07 ENCOUNTER — Encounter: Payer: Self-pay | Admitting: Family Medicine

## 2015-01-07 ENCOUNTER — Ambulatory Visit (INDEPENDENT_AMBULATORY_CARE_PROVIDER_SITE_OTHER): Payer: BLUE CROSS/BLUE SHIELD | Admitting: Family Medicine

## 2015-01-07 VITALS — BP 109/88 | HR 89 | Temp 98.5°F | Wt 140.0 lb

## 2015-01-07 DIAGNOSIS — R1032 Left lower quadrant pain: Secondary | ICD-10-CM

## 2015-01-07 DIAGNOSIS — R109 Unspecified abdominal pain: Secondary | ICD-10-CM | POA: Insufficient documentation

## 2015-01-07 LAB — CBC
HEMATOCRIT: 41.8 % (ref 36.0–46.0)
HEMOGLOBIN: 14.4 g/dL (ref 12.0–15.0)
MCH: 32.6 pg (ref 26.0–34.0)
MCHC: 34.4 g/dL (ref 30.0–36.0)
MCV: 94.6 fL (ref 78.0–100.0)
MPV: 10.8 fL (ref 8.6–12.4)
Platelets: 242 10*3/uL (ref 150–400)
RBC: 4.42 MIL/uL (ref 3.87–5.11)
RDW: 12.7 % (ref 11.5–15.5)
WBC: 11 10*3/uL — AB (ref 4.0–10.5)

## 2015-01-07 LAB — COMPREHENSIVE METABOLIC PANEL
ALBUMIN: 4.8 g/dL (ref 3.6–5.1)
ALK PHOS: 62 U/L (ref 33–130)
ALT: 18 U/L (ref 6–29)
AST: 15 U/L (ref 10–35)
BILIRUBIN TOTAL: 1.4 mg/dL — AB (ref 0.2–1.2)
BUN: 12 mg/dL (ref 7–25)
CALCIUM: 9.8 mg/dL (ref 8.6–10.4)
CO2: 30 mmol/L (ref 20–31)
Chloride: 98 mmol/L (ref 98–110)
Creat: 0.62 mg/dL (ref 0.50–1.05)
Glucose, Bld: 95 mg/dL (ref 65–99)
Potassium: 4 mmol/L (ref 3.5–5.3)
Sodium: 139 mmol/L (ref 135–146)
Total Protein: 7.7 g/dL (ref 6.1–8.1)

## 2015-01-07 LAB — POCT URINALYSIS DIPSTICK
BILIRUBIN UA: NEGATIVE
Glucose, UA: NEGATIVE
KETONES UA: NEGATIVE
LEUKOCYTES UA: NEGATIVE
Nitrite, UA: NEGATIVE
PH UA: 6
Protein, UA: NEGATIVE
Spec Grav, UA: 1.02
Urobilinogen, UA: 0.2

## 2015-01-07 LAB — LIPASE: LIPASE: 28 U/L (ref 7–60)

## 2015-01-07 MED ORDER — METRONIDAZOLE 500 MG PO TABS: 500.0000 mg | ORAL_TABLET | Freq: Three times a day (TID) | ORAL | Status: AC

## 2015-01-07 MED ORDER — CIPROFLOXACIN HCL 500 MG PO TABS: 500.0000 mg | ORAL_TABLET | Freq: Two times a day (BID) | ORAL | Status: DC

## 2015-01-07 NOTE — Assessment & Plan Note (Addendum)
Improving. Most likely due to an acute episode of diverticulitis given fever noted at home, constant LLQ pain. Acutely, the pain is improved and she has no concerning physical exam signs that would necessitate imaging or hospitalization. Will get CBC and CMP to evaluate for underlying infection, metabolic derangement contributing to this issue. Also considered was nephrolithiasis for which urine dipstick obtained showing large blood (consistent with past results), will get urine micro and culture.   Patient appears to be comfortable and reliable. Watchful waiting and empiric treatment as above. Return as needed. Carefully discussed red flag signs or symptoms.

## 2015-01-07 NOTE — Patient Instructions (Signed)
You were seen today for your abdominal pain, most likely an episode of diverticulitis.  We will begin ciprofloxacin and Flagyl to treat this.  We will check your urine and blood to ensure kidney stones or infection is not the cause.  You may eat as able, staring with clear diet and advancing as able.  Please call us for worsening pain and/or fever.

## 2015-01-07 NOTE — Progress Notes (Signed)
Rachel Mack is a 54 y.o. female who presents to [facility]  today for abdominal pain.   1) abdominal pain. Patient noted Left flank pain beginning on Sunday while at work as [employer]. Pain started intermittently then progressed to 10/10 cramping, generalized abdominal pain Sunday night. She took her temperature and it was 100.4. The cramping pain has improved since Monday, and she now has mild LLQ and L flank pain which has been constant. She notes nausea and decreased appetite on Sunday which has improved since then, though she is afraid to eat. She denies change in stool caliber, frequency, consistency and color. She denies vomiting. She does note history of kidney stone many years ago, and this not feel similar. Denies radiation to groin and lower back pain. Pain was intermittent on Sunday but has been constant since. Patient notes she frequently has large blood appear on dipstick urine when she has been checked in the past. She passes stool and flatus, and is able to eat and drink a small amount.    Past Medical History  Diagnosis Date  . Hypothyroidism   . Perimenopausal     helped regulate menses  . Vitamin B 12 deficiency   . Hypertension   . Nephrolithiasis     nonobstructive; has never passed one   Past Surgical History  Procedure Laterality Date  . Gynecologic cryosurgery  2005 or before    cervicitis (Dr. Lissa Hoard in [place])   Social History  Substance Use Topics  . Smoking status: Former Smoker    Quit date: 02/23/2004  . Smokeless tobacco: Never Used  . Alcohol Use: No   family history includes Breast cancer (age of onset: 40) in her mother; Colon cancer (age of onset: 44) in her paternal grandmother; Diabetes in her mother; Drug abuse in her sister; Hyperlipidemia in her mother; Hypertension in her mother.  ROS as above Medications: Current Outpatient Prescriptions  Medication Sig Dispense Refill  . ALPRAZolam (XANAX) 0.25 MG  tablet TAKE 1 TABLET BY MOUTH AT BEDTIME AS NEEDED SLEEP 30 tablet 1  . Coconut Oil 1000 MG CAPS Take by mouth.    . cyanocobalamin (,VITAMIN B-12,) 1000 MCG/ML injection Inject 1 mL (1,000 mcg total) into the muscle once. 3 mL 2  . hydrochlorothiazide (MICROZIDE) 12.5 MG capsule TAKE 1 CAPSULE EVERY MORNING 90 capsule 2  . Nutritional Supplements (MENOPAUSE FORMULA PO) Take by mouth.    . SYNTHROID 50 MCG tablet TAKE 1 TABLET (50 MCG TOTAL) BY MOUTH DAILY. BRAND MEDICALLY NECCESSARY 30 tablet 9  . ciprofloxacin (CIPRO) 500 MG tablet Take 1 tablet (500 mg total) by mouth 2 (two) times daily. 14 tablet 0  . metroNIDAZOLE (FLAGYL) 500 MG tablet Take 1 tablet (500 mg total) by mouth 3 (three) times daily. 21 tablet 0   No current facility-administered medications for this visit.   Allergies  Allergen Reactions  . Amoxicillin     REACTION: yeast infection  . Bupropion Hcl     REACTION: heart palpitations  . Sulfa Antibiotics      Exam:  BP 109/88 mmHg  Pulse 89  Temp(Src) 98.5 F (36.9 C) (Oral)  Wt 140 lb (63.504 kg)  LMP 10/22/2011 Gen: Uncomfortable, but non-toxic appearing woman in NAD seated in chair  HEENT: EOMI,  MMM Lungs: Normal work of breathing. CTABL no wheeze crackles  Heart: RRR no MRG Abd: NABS x 4 quadrants, Soft. Nondistended, tender to deep palpation in LLQ and L flank.  No guarding or rebound tenderness. Negative psoas sign on left. Exts: Brisk capillary refill, warm and well perfused.   Results for orders placed or performed in visit on 01/07/15 (from the past 24 hour(s))  POCT Urinalysis Dipstick     Status: Abnormal   Collection Time: 01/07/15 11:42 AM  Result Value Ref Range   Color, UA yellow    Clarity, UA clear    Glucose, UA neg    Bilirubin, UA neg    Ketones, UA neg    Spec Grav, UA 1.020    Blood, UA large    pH, UA 6.0    Protein, UA neg    Urobilinogen, UA 0.2    Nitrite, UA neg    Leukocytes, UA Negative Negative   No results  found.   Please see individual assessment and plan sections.

## 2015-01-08 LAB — URINALYSIS, MICROSCOPIC ONLY
BACTERIA UA: NONE SEEN [HPF]
CRYSTALS: NONE SEEN [HPF]
Casts: NONE SEEN [LPF]
RBC / HPF: NONE SEEN RBC/HPF (ref <=2)
Yeast: NONE SEEN [HPF]

## 2015-01-08 LAB — URINALYSIS, ROUTINE W REFLEX MICROSCOPIC
BILIRUBIN URINE: NEGATIVE
Glucose, UA: NEGATIVE
Ketones, ur: NEGATIVE
Nitrite: NEGATIVE
PROTEIN: NEGATIVE
SPECIFIC GRAVITY, URINE: 1.017 (ref 1.001–1.035)
pH: 6 (ref 5.0–8.0)

## 2015-01-08 NOTE — Progress Notes (Signed)
Quick Note:  Bilirubin is mildly elevated. This can be due to gallbladder issues. If you do not get better we will recheck labs and order an ultrasound.  White blood count is also mildly elevated. This is likely due to infection. ______

## 2015-01-09 LAB — URINE CULTURE

## 2015-01-10 ENCOUNTER — Encounter: Payer: BLUE CROSS/BLUE SHIELD | Admitting: Physician Assistant

## 2015-01-10 ENCOUNTER — Encounter: Payer: BLUE CROSS/BLUE SHIELD | Admitting: Family Medicine

## 2015-02-12 ENCOUNTER — Other Ambulatory Visit: Payer: Self-pay | Admitting: Family Medicine

## 2015-03-03 ENCOUNTER — Ambulatory Visit (INDEPENDENT_AMBULATORY_CARE_PROVIDER_SITE_OTHER): Payer: BLUE CROSS/BLUE SHIELD | Admitting: Family Medicine

## 2015-03-03 ENCOUNTER — Encounter: Payer: Self-pay | Admitting: Family Medicine

## 2015-03-03 ENCOUNTER — Ambulatory Visit (INDEPENDENT_AMBULATORY_CARE_PROVIDER_SITE_OTHER): Payer: BLUE CROSS/BLUE SHIELD

## 2015-03-03 VITALS — BP 123/81 | HR 72 | Wt 144.0 lb

## 2015-03-03 DIAGNOSIS — M545 Low back pain, unspecified: Secondary | ICD-10-CM

## 2015-03-03 DIAGNOSIS — M5136 Other intervertebral disc degeneration, lumbar region: Secondary | ICD-10-CM | POA: Insufficient documentation

## 2015-03-03 DIAGNOSIS — M7062 Trochanteric bursitis, left hip: Secondary | ICD-10-CM

## 2015-03-03 DIAGNOSIS — M79605 Pain in left leg: Principal | ICD-10-CM

## 2015-03-03 DIAGNOSIS — M51369 Other intervertebral disc degeneration, lumbar region without mention of lumbar back pain or lower extremity pain: Secondary | ICD-10-CM

## 2015-03-03 HISTORY — DX: Other intervertebral disc degeneration, lumbar region: M51.36

## 2015-03-03 HISTORY — DX: Other intervertebral disc degeneration, lumbar region without mention of lumbar back pain or lower extremity pain: M51.369

## 2015-03-03 MED ORDER — DICLOFENAC SODIUM 1 % TD GEL: 2.0000 g | Freq: Four times a day (QID) | TRANSDERMAL | Status: DC

## 2015-03-03 NOTE — Assessment & Plan Note (Signed)
Symptoms consistent with trochanteric bursitis. Plan for physical therapy topical NSAIDs oral NSAIDs and follow-up in one month.

## 2015-03-03 NOTE — Patient Instructions (Addendum)
Thank you for coming in today. His topical diclofenac gel on the hip as needed. Attend physical therapy. Get xray.  Come back or go to the emergency room if you notice new weakness new numbness problems walking or bowel or bladder problems. Return in 1 month.   Trochanteric Bursitis You have hip pain due to trochanteric bursitis. Bursitis means that the sack near the outside of the hip is filled with fluid and inflamed. This sack is made up of protective soft tissue. The pain from trochanteric bursitis can be severe and keep you from sleep. It can radiate to the buttocks or down the outside of the thigh to the knee. The pain is almost always worse when rising from the seated or lying position and with walking. Pain can improve after you take a few steps. It happens more often in people with hip joint and lumbar spine problems, such as arthritis or previous surgery. Very rarely the trochanteric bursa can become infected, and antibiotics and/or surgery may be needed. Treatment often includes an injection of local anesthetic mixed with cortisone medicine. This medicine is injected into the area where it is most tender over the hip. Repeat injections may be necessary if the response to treatment is slow. You can apply ice packs over the tender area for 30 minutes every 2 hours for the next few days. Anti-inflammatory and/or narcotic pain medicine may also be helpful. Limit your activity for the next few days if the pain continues. See your caregiver in 5-10 days if you are not greatly improved.  SEEK IMMEDIATE MEDICAL CARE IF:  You develop severe pain, fever, or increased redness.  You have pain that radiates below the knee. EXERCISES STRETCHING EXERCISES - Trochanteric Bursitis  These exercises may help you when beginning to rehabilitate your injury. Your symptoms may resolve with or without further involvement from your physician, physical therapist, or athletic trainer. While completing these  exercises, remember:   Restoring tissue flexibility helps normal motion to return to the joints. This allows healthier, less painful movement and activity.  An effective stretch should be held for at least 30 seconds.  A stretch should never be painful. You should only feel a gentle lengthening or release in the stretched tissue. STRETCH - Iliotibial Band  On the floor or bed, lie on your side so your injured leg is on top. Bend your knee and grab your ankle.  Slowly bring your knee back so that your thigh is in line with your trunk. Keep your heel at your buttocks and gently arch your back so your head, shoulders and hips line up.  Slowly lower your leg so that your knee approaches the floor/bed until you feel a gentle stretch on the outside of your thigh. If you do not feel a stretch and your knee will not fall farther, place the heel of your opposite foot on top of your knee and pull your thigh down farther.  Hold this stretch for __________ seconds.  Repeat __________ times. Complete this exercise __________ times per day. STRETCH - Hamstrings, Supine   Lie on your back. Loop a belt or towel over the ball of your foot as shown.  Straighten your knee and slowly pull on the belt to raise your injured leg. Do not allow the knee to bend. Keep your opposite leg flat on the floor.  Raise the leg until you feel a gentle stretch behind your knee or thigh. Hold this position for __________ seconds.  Repeat __________ times. Complete  this stretch __________ times per day. STRETCH - Quadriceps, Prone   Lie on your stomach on a firm surface, such as a bed or padded floor.  Bend your knee and grasp your ankle. If you are unable to reach your ankle or pant leg, use a belt around your foot to lengthen your reach.  Gently pull your heel toward your buttocks. Your knee should not slide out to the side. You should feel a stretch in the front of your thigh and/or knee.  Hold this position for  __________ seconds.  Repeat __________ times. Complete this stretch __________ times per day. STRETCHING - Hip Flexors, Lunge Half kneel with your knee on the floor and your opposite knee bent and directly over your ankle.  Keep good posture with your head over your shoulders. Tighten your buttocks to point your tailbone downward; this will prevent your back from arching too much.  You should feel a gentle stretch in the front of your thigh and/or hip. If you do not feel any resistance, slightly slide your opposite foot forward and then slowly lunge forward so your knee once again lines up over your ankle. Be sure your tailbone remains pointed downward.  Hold this  stretch for __________ seconds.  Repeat __________ times. Complete this stretch __________ times per day. STRETCH - Adductors, Lunge  While standing, spread your legs.  Lean away from your injured leg by bending your opposite knee. You may rest your hands on your thigh for balance.  You should feel a stretch in your inner thigh. Hold for __________ seconds.  Repeat __________ times. Complete this exercise __________ times per day.   This information is not intended to replace advice given to you by your health care provider. Make sure you discuss any questions you have with your health care provider.   Document Released: 03/18/2004 Document Revised: 06/25/2014 Document Reviewed: 05/23/2008 Elsevier Interactive Patient Education 2016 [employer]   Lumbosacral Radiculopathy Lumbosacral radiculopathy is a condition that involves the spinal nerves and nerve roots in the low back and bottom of the spine. The condition develops when these nerves and nerve roots move out of place or become inflamed and cause symptoms. CAUSES This condition may be caused by:  Pressure from a disk that bulges out of place (herniated disk). A disk is a plate of cartilage that separates bones in the spine.  Disk degeneration.  A narrowing of  the bones of the lower back (spinal stenosis).  A tumor.  An infection.  An injury that places sudden pressure on the disks that cushion the bones of your lower spine. RISK FACTORS This condition is more likely to develop in:  Males aged 30-50 years.  Females aged 62-60 years.  People who lift improperly.  People who are overweight or live a sedentary lifestyle.  People who smoke.  People who perform repetitive activities that strain the spine. SYMPTOMS Symptoms of this condition include:  Pain that goes down from the back into the legs (sciatica). This is the most common symptom. The pain may be worse with sitting, coughing, or sneezing.  Pain and numbness in the arms and legs.  Muscle weakness.  Tingling.  Loss of bladder control or bowel control. DIAGNOSIS This condition is diagnosed with a physical exam and medical history. If the pain is lasting, you may have tests, such as:  MRI scan.  X-ray.  CT scan.  Myelogram.  Nerve conduction study. TREATMENT This condition is often treated with:  Hot packs and  ice applied to affected areas.  Stretches to improve flexibility.  Exercises to strengthen back muscles.  Physical therapy.  Pain medicine.  A steroid injection in the spine. In some cases, no treatment is needed. If the condition is long-lasting (chronic), or if symptoms are severe, treatment may involve surgery or lifestyle changes, such as following a weight loss plan. HOME CARE INSTRUCTIONS Medicines  Take medicines only as directed by your health care provider.  Do not drive or operate heavy machinery while taking pain medicine. Injury Care  Apply a heat pack to the injured area as directed by your health care provider.  Apply ice to the affected area:  Put ice in a plastic bag.  Place a towel between your skin and the bag.  Leave the ice on for 20-30 minutes, every 2 hours while you are awake or as needed. Or, leave the ice on for as  long as directed by your health care provider. Other Instructions  If you were shown how to do any exercises or stretches, do them as directed by your health care provider.  If your health care provider prescribed a diet or exercise program, follow it as directed.  Keep all follow-up visits as directed by your health care provider. This is important. SEEK MEDICAL CARE IF:  Your pain does not improve over time even when taking pain medicines. SEEK IMMEDIATE MEDICAL CARE IF:  Your develop severe pain.  Your pain suddenly gets worse.  You develop increasing weakness in your legs.  You lose the ability to control your bladder or bowel.  You have difficulty walking or balancing.  You have a fever.   This information is not intended to replace advice given to you by your health care provider. Make sure you discuss any questions you have with your health care provider.   Document Released: 02/08/2005 Document Revised: 06/25/2014 Document Reviewed: 02/04/2014 Elsevier Interactive Patient Education [employer].

## 2015-03-03 NOTE — Assessment & Plan Note (Signed)
Patient additionally has pain consistent with lumbar radiculopathy. I'm unclear if this is L5 nerve root related or piriformis syndrome related. Plan for physical therapy.  X-ray pending. Recheck in one month.

## 2015-03-03 NOTE — Progress Notes (Signed)
   Subjective:    I'm seeing this patient as a consultation for:  Dr Madilyn Fireman  CC: left buttock pain and radiculopathy   HPI: Patient has a 3 to 4 month history of left-sided buttocks pain with pain radiating to the left shin. She denies any weakness or numbness. Pain is worse at night when she lies on that side. Sometimes the pain will wake her from sleep. She takes ibuprofen and Tylenol over-the-counter which does help. She denies any fevers chills nausea vomiting or diarrhea. She has not had any significant workup for this problem yet.  Past medical history, Surgical history, Family history not pertinant except as noted below, Social history, Allergies, and medications have been entered into the medical record, reviewed, and no changes needed.   Review of Systems: No headache, visual changes, nausea, vomiting, diarrhea, constipation, dizziness, abdominal pain, skin rash, fevers, chills, night sweats, weight loss, swollen lymph nodes, body aches, joint swelling, muscle aches, chest pain, shortness of breath, mood changes, visual or auditory hallucinations.   Objective:    Filed Vitals:   03/03/15 1111  BP: 123/81  Pulse: 72   General: Well Developed, well nourished, and in no acute distress.  Neuro/Psych: Alert and oriented x3, extra-ocular muscles intact, able to move all 4 extremities, sensation grossly intact. Skin: Warm and dry, no rashes noted.  Respiratory: Not using accessory muscles, speaking in full sentences, trachea midline.  Cardiovascular: Pulses palpable, no extremity edema. Abdomen: Does not appear distended. MSK: Back is nontender to spinal midline. Normal back motion. Negative straight leg raise test Corky Sox test and crossover pretzel stretch bilaterally. Negative stork test. Hip: Left hip is tender to palpation at the lateral hip and at the initial tuberosity. Right hip is nontender. Hip motion is normal bilaterally. Strength is impaired hip abduction bilaterally  4/5. Normal gait.    No results found for this or any previous visit (from the past 24 hour(s)). No results found.  Impression and Recommendations:   This case required medical decision making of moderate complexity.

## 2015-03-03 NOTE — Progress Notes (Signed)
Quick Note:  Xray was normal. ______ 

## 2015-03-07 ENCOUNTER — Ambulatory Visit: Payer: BLUE CROSS/BLUE SHIELD | Admitting: Rehabilitative and Restorative Service Providers"

## 2015-04-03 ENCOUNTER — Encounter: Payer: Self-pay | Admitting: Rehabilitative and Restorative Service Providers"

## 2015-04-03 ENCOUNTER — Ambulatory Visit (INDEPENDENT_AMBULATORY_CARE_PROVIDER_SITE_OTHER): Payer: BLUE CROSS/BLUE SHIELD | Admitting: Family Medicine

## 2015-04-03 ENCOUNTER — Encounter: Payer: Self-pay | Admitting: Family Medicine

## 2015-04-03 ENCOUNTER — Ambulatory Visit (INDEPENDENT_AMBULATORY_CARE_PROVIDER_SITE_OTHER): Payer: BLUE CROSS/BLUE SHIELD | Admitting: Rehabilitative and Restorative Service Providers"

## 2015-04-03 ENCOUNTER — Ambulatory Visit: Payer: BLUE CROSS/BLUE SHIELD | Admitting: Family Medicine

## 2015-04-03 VITALS — BP 115/83 | HR 69 | Wt 143.0 lb

## 2015-04-03 DIAGNOSIS — M2560 Stiffness of unspecified joint, not elsewhere classified: Secondary | ICD-10-CM

## 2015-04-03 DIAGNOSIS — N951 Menopausal and female climacteric states: Secondary | ICD-10-CM

## 2015-04-03 DIAGNOSIS — Z7409 Other reduced mobility: Secondary | ICD-10-CM

## 2015-04-03 DIAGNOSIS — M545 Low back pain, unspecified: Secondary | ICD-10-CM

## 2015-04-03 DIAGNOSIS — M7918 Myalgia, other site: Secondary | ICD-10-CM

## 2015-04-03 DIAGNOSIS — R6889 Other general symptoms and signs: Secondary | ICD-10-CM

## 2015-04-03 DIAGNOSIS — M7062 Trochanteric bursitis, left hip: Secondary | ICD-10-CM

## 2015-04-03 DIAGNOSIS — M797 Fibromyalgia: Secondary | ICD-10-CM | POA: Diagnosis not present

## 2015-04-03 DIAGNOSIS — M79605 Pain in left leg: Secondary | ICD-10-CM

## 2015-04-03 DIAGNOSIS — M623 Immobility syndrome (paraplegic): Secondary | ICD-10-CM | POA: Diagnosis not present

## 2015-04-03 DIAGNOSIS — R531 Weakness: Secondary | ICD-10-CM

## 2015-04-03 NOTE — Patient Instructions (Signed)
Thank you for coming in today. Return in 1 month.  Attend PT.  Come back or go to the emergency room if you notice new weakness new numbness problems walking or bowel or bladder problems.

## 2015-04-03 NOTE — Assessment & Plan Note (Signed)
Referral back to physical therapy. Recheck 1 month. Proceed with bone density testing.

## 2015-04-03 NOTE — Therapy (Signed)
[place], [place], [address] Phone: [phone]   Fax:  [phone]  Physical Therapy Evaluation  Patient Details  Name: Rachel Mack MRN: [id number] Date of Birth: Dec 04, 1960 Referring Provider: Dr. Lynne Leader  Encounter Date: 04/03/2015      PT End of Session - 04/03/15 1239    Visit Number 1   Number of Visits 12   Date for PT Re-Evaluation 05/15/15   PT Start Time 1151   PT Stop Time 1249   PT Time Calculation (min) 58 min   Activity Tolerance Patient tolerated treatment well      Past Medical History  Diagnosis Date  . Hypothyroidism   . Perimenopausal     helped regulate menses  . Vitamin B 12 deficiency   . Hypertension   . Nephrolithiasis     nonobstructive; has never passed one    Past Surgical History  Procedure Laterality Date  . Gynecologic cryosurgery  2005 or before    cervicitis (Dr. Lissa Hoard in [place])    There were no vitals filed for this visit.  Visit Diagnosis:  Lumbar pain with radiation down left leg - Plan: PT plan of care cert/re-cert  Stiffness due to immobility - Plan: PT plan of care cert/re-cert  Myofacial muscle pain - Plan: PT plan of care cert/re-cert  Decreased strength, endurance, and mobility - Plan: PT plan of care cert/re-cert      Subjective Assessment - 04/03/15 1156    Subjective Patient reports that she has been having pain in the Lt hip with pain running down into the Lt knee and shin for the past 6 months. Symptoms have been some better with stretches and tylenol in the past week but she continues to have pain in the Lt hip and leg.    Pertinent History seen here in PT for LE problems ~ 3-4 years    How long can you sit comfortably? 30 min    How long can you stand comfortably? 30 min    How long can you walk comfortably? OK with walking - has difficulty standing to walk after she has been sitting    Currently in Pain? Yes   Pain Score 6    Pain Location Hip   Pain Orientation Left   Pain Descriptors / Indicators Sharp;Pounding   Pain Radiating Towards into Lt thigh and knee inot the anterior leg    Pain Onset More than a month ago   Pain Frequency Intermittent   Aggravating Factors  sitting; prolonged standing; bending   Pain Relieving Factors tylenol; cream from MD             [facility] PT Assessment - 04/03/15 0001    Assessment   Medical Diagnosis Trochanteric bursitis Lt; LBP with Lt LE pain   Referring Provider Dr. Lynne Leader   Onset Date/Surgical Date 09/23/14   Hand Dominance Right   Next MD Visit no return scheduled   Prior Therapy none for this episode    Precautions   Precautions None   Balance Screen   Has the patient fallen in the past 6 months No   Has the patient had a decrease in activity level because of a fear of falling?  No   Is the patient reluctant to leave their home because of a fear of falling?  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[employer] - lifting bending; reaching; pulling; pushing    Leisure household otherwise sedentary - new recliner    Observation/Other Assessments   Focus on Therapeutic Outcomes (FOTO)  45% limitation    Sensation   Additional Comments tingling in Lt foot whole foot - episodically resolves in 1-2 min with walking (less than 1x/wk) has happened ~twice    Posture/Postural Control   Posture Comments slight head forward; hip flexion; knees hyperextended   AROM   Overall AROM Comments Bilat LE's WFL's    Lumbar Flexion 80%   Lumbar Extension 50%   Lumbar - Right Side Bend 80%   Lumbar - Left Side Bend 80%   Lumbar - Right Rotation 60%   Lumbar - Left Rotation 60%   Strength    Overall Strength Comments 5/5 except bilat hip ext 4+/5   Flexibility   Hamstrings Rt 79 deg; Lt 70 deg    Quadriceps heel to buttock in prone Rt 7 in; Lt 4 in   ITB tight bilat pulling into the thigh on Rt    Piriformis tight bilat Lt >> Rt    Palpation   Spinal mobility hypomobility CPA L1 - L5; Rt UPA L3/4; Lt UPA L4/5    SI assessment  WFL's   Palpation comment tight Lt QL; lumbar paraspinals; Lt piriformis; hip abductors; IT band; lesser through Lt hamstrings                    [facility] Adult PT Treatment/Exercise - 04/03/15 0001    Self-Care   Self-Care --  avoid recliner/prolonged sitting    Neuro Re-ed    Neuro Re-ed Details  avoid standing with knees hyperextended   Lumbar Exercises: Stretches   Passive Hamstring Stretch 3 reps;30 seconds   Standing Extension --  2-3 sec hold x 3   Press Ups --  2-3 sec hold x10    Piriformis Stretch 3 reps;30 seconds   Lumbar Exercises: Supine   Ab Set --  3 part core 10 sec x 10   Moist Heat Therapy   Number Minutes Moist Heat 15 Minutes   Moist Heat Location Lumbar Spine;Hip  Lt   Acupuncturist Location Lt lumbar spine; hip    Electrical Stimulation Action IFC   Electrical Stimulation Parameters to tolerance    Electrical Stimulation Goals Pain;Tone                PT Education - 04/03/15 1226    Education provided Yes   Education Details spine care; avoid standing with knees hyperextended; HEP; TENS info(has unit at home)   Person(s) Educated Patient   Methods Explanation;Demonstration;Tactile cues;Verbal cues;Handout   Comprehension Verbalized understanding;Returned demonstration;Verbal cues required;Tactile cues required             PT Long Term Goals - 04/03/15 1303    PT LONG TERM GOAL #1   Title Improve sitting; standing tolerance to 1 hours 05/15/15   Time 6   Period Weeks   Status New   PT LONG TERM GOAL #2   Title 5/5 bilat hip ext 05/15/15   Time 6    Period Weeks   Status New   PT LONG TERM GOAL #3   Title Improve spinal mobility/hamstring  flexibility to [facility] 05/15/15   Time 6   Period Weeks   Status New   PT LONG TERM GOAL #4   Title I in HEP 05/15/15   Time 6   Period Weeks   Status New   PT LONG TERM GOAL #5   Title Improve FOTO to </= 34% limitation 05/15/15   Time 6   Period Weeks   Status New               Plan - 04/03/15 1240    Clinical Impression Statement Patient presents with signs and symptoms consistent with lumbar dysfunction; Lt LE radicular pain and Lt LE tightness to palpation. She will benefit form PT to address problems identified.    Pt will benefit from skilled therapeutic intervention in order to improve on the following deficits Improper body mechanics;Decreased range of motion;Decreased mobility;Decreased endurance;Decreased activity tolerance;Increased fascial restricitons;Pain   Rehab Potential Good   PT Frequency 2x / week   PT Duration 6 weeks   PT Treatment/Interventions Patient/family education;ADLs/Self Care Home Management;Therapeutic exercise;Therapeutic activities;Neuromuscular re-education;Manual techniques;Dry needling;Cryotherapy;Electrical Stimulation;Moist Heat;Ultrasound   PT Next Visit Plan stretch quads/IT band; progress with core stabilization; manual work vs TDN to tight musculature of Lt lumbar spine and hip; modalities   PT Home Exercise Plan spine care; TENS; HEP    Consulted and Agree with Plan of Care Patient         Problem List Patient Active Problem List   Diagnosis Date Noted  . Lumbar pain with radiation down left leg 03/03/2015  . Trochanteric bursitis of left hip 03/03/2015  . Abdominal pain 01/07/2015  . Urinary tract infectious disease 06/05/2014  . Preventative health care 07/27/2013  . Inverted nipple 02/06/2013  . Vitamin D deficiency 01/27/2012  . Insomnia 11/01/2011  . Pericardial effusion 01/08/2011  . Essential hypertension, benign 09/16/2010  .  FOOT PAIN, RIGHT 01/19/2010  . B12 DEFICIENCY 12/26/2008  . IRREGULAR MENSES 07/31/2008  . UNSPECIFIED HYPOTHYROIDISM 04/25/2008  . DEPRESSION, MILD 04/25/2008  . PERIMENOPAUSAL STATUS 04/25/2008    Josemaria Brining Nilda Simmer PT, MPH  04/03/2015, 1:08 PM  [facility] [place], [place], [address] Phone: [phone]   Fax:  [phone]  Name: Rachel Mack MRN: [id number] Date of Birth: August 06, 1960

## 2015-04-03 NOTE — Progress Notes (Signed)
       Rachel Mack is a 55 y.o. female who presents to [facility] today for follow-up back pain. Patient was seen last month for back and leg pain thought to be due to lumbar radiculopathy and trochanteric bursitis. She unfortunately was not able to proceed with physical therapy because of a death in her family and a 48 gallon fish tank that broke in her living room. She notes her symptoms are about the same. She denies any weakness or numbness or loss of function.  Additionally patient notes that she like a bone density test for screening for osteoporosis. She has a family history of osteoporosis.   Past Medical History  Diagnosis Date  . Hypothyroidism   . Perimenopausal     helped regulate menses  . Vitamin B 12 deficiency   . Hypertension   . Nephrolithiasis     nonobstructive; has never passed one   Past Surgical History  Procedure Laterality Date  . Gynecologic cryosurgery  2005 or before    cervicitis (Dr. Lissa Hoard in [place])   Social History  Substance Use Topics  . Smoking status: Former Smoker    Quit date: 02/23/2004  . Smokeless tobacco: Never Used  . Alcohol Use: No   family history includes Breast cancer (age of onset: 26) in her mother; Colon cancer (age of onset: 8) in her paternal grandmother; Diabetes in her mother; Drug abuse in her sister; Hyperlipidemia in her mother; Hypertension in her mother.  ROS as above Medications: Current Outpatient Prescriptions  Medication Sig Dispense Refill  . ALPRAZolam (XANAX) 0.25 MG tablet TAKE ONE TABLETAT BEDTIME AS NEEDED FOR SLEEP 30 tablet 1  . Coconut Oil 1000 MG CAPS Take by mouth.    . cyanocobalamin (,VITAMIN B-12,) 1000 MCG/ML injection Inject 1 mL (1,000 mcg total) into the muscle once. 3 mL 2  . diclofenac sodium (VOLTAREN) 1 % GEL Apply 2 g topically 4 (four) times daily. 100 g 12  . hydrochlorothiazide  (MICROZIDE) 12.5 MG capsule TAKE 1 CAPSULE EVERY MORNING 90 capsule 2  . Nutritional Supplements (MENOPAUSE FORMULA PO) Take by mouth.    . SYNTHROID 50 MCG tablet TAKE 1 TABLET (50 MCG TOTAL) BY MOUTH DAILY. BRAND MEDICALLY NECCESSARY 30 tablet 9   No current facility-administered medications for this visit.   Allergies  Allergen Reactions  . Amoxicillin     REACTION: yeast infection  . Bupropion Hcl     REACTION: heart palpitations  . Sulfa Antibiotics      Exam:  BP 115/83 mmHg  Pulse 69  Wt 143 lb (64.864 kg) Gen: Well NAD MSK: Back is nontender to spinal midline. Normal back motion. Negative straight leg raise test Corky Sox test and crossover pretzel stretch bilaterally. Negative stork test. Hip: Left hip is tender to palpation at the lateral hip and at the initial tuberosity. Right hip is nontender. Hip motion is normal bilaterally. Strength is impaired hip abduction bilaterally 4/5. Normal gait.  No results found for this or any previous visit (from the past 24 hour(s)). No results found.   Please see individual assessment and plan sections.

## 2015-04-03 NOTE — Patient Instructions (Signed)
Avoid standing with knees locked!!   Trunk: Prone Extension (Press-Ups)    Lie on stomach on firm, flat surface. Relax bottom and legs. Raise chest in air with elbows straight. Keep hips flat on surface, sag stomach. Hold ___2-3_ seconds. Repeat _10___ times. Do __3-4__ sessions per day. CAUTION: Movement should be gentle and slow.  Trunk Extension    Standing, place back of open hands on low back. Straighten spine then arch the back and move shoulders back. Repeat __2-3__ times per session. Do __several__ times per day   Abdominal Bracing With Pelvic Floor (Hook-Lying)    With neutral spine, tighten pelvic floor and abdominals sucking; tighten muscles in back at waist. Hold 10 sec  Repeat _10__ times. Do __several_ times a day. Progress to do this with sitting; standing; walking; and working   Piriformis Stretch    Lying on back, pull right knee toward opposite shoulder. Hold __30__ seconds. Repeat ___3_ times. Do _3___ sessions per day.  HIP: Hamstrings - Supine    Place strap around foot. Raise leg up, keep knee straight. Hold __30_ seconds. _3__ reps per set, 3_ sets per day   Sleeping on Back  Place pillow under knees. A pillow with cervical support and a roll around waist are also helpful. Copyright  VHI. All rights reserved.  Sleeping on Side Place pillow between knees. Use cervical support under neck and a roll around waist as needed. Copyright  VHI. All rights reserved.   Sleeping on Stomach   If this is the only desirable sleeping position, place pillow under lower legs, and under stomach or chest as needed.  Posture - Sitting   Sit upright, head facing forward. Try using a roll to support lower back. Keep shoulders relaxed, and avoid rounded back. Keep hips level with knees. Avoid crossing legs for long periods. Stand to Sit / Sit to Stand   To sit: Bend knees to lower self onto front edge of chair, then scoot back on seat. To stand: Reverse  sequence by placing one foot forward, and scoot to front of seat. Use rocking motion to stand up.   Work Height and Reach  Ideal work height is no more than 2 to 4 inches below elbow level when standing, and at elbow level when sitting. Reaching should be limited to arm's length, with elbows slightly bent.  Bending  Bend at hips and knees, not back. Keep feet shoulder-width apart.    Posture - Standing   Good posture is important. Avoid slouching and forward head thrust. Maintain curve in low back and align ears over shoul- ders, hips over ankles.  Alternating Positions   Alternate tasks and change positions frequently to reduce fatigue and muscle tension. Take rest breaks. Computer Work   Position work to Programmer, multimedia. Use proper work and seat height. Keep shoulders back and down, wrists straight, and elbows at right angles. Use chair that provides full back support. Add footrest and lumbar roll as needed.  Getting Into / Out of Car  Lower self onto seat, scoot back, then bring in one leg at a time. Reverse sequence to get out.  Dressing  Lie on back to pull socks or slacks over feet, or sit and bend leg while keeping back straight.    Housework - Sink  Place one foot on ledge of cabinet under sink when standing at sink for prolonged periods.   Pushing / Pulling  Pushing is preferable to pulling. Keep back in proper alignment, and use  leg muscles to do the work.  Deep Squat   Squat and lift with both arms held against upper trunk. Tighten stomach muscles without holding breath. Use smooth movements to avoid jerking.  Avoid Twisting   Avoid twisting or bending back. Pivot around using foot movements, and bend at knees if needed when reaching for articles.  Carrying Luggage   Distribute weight evenly on both sides. Use a cart whenever possible. Do not twist trunk. Move body as a unit.   Lifting Principles .Maintain proper posture and head alignment. .Slide  object as close as possible before lifting. .Move obstacles out of the way. .Test before lifting; ask for help if too heavy. .Tighten stomach muscles without holding breath. .Use smooth movements; do not jerk. .Use legs to do the work, and pivot with feet. .Distribute the work load symmetrically and close to the center of trunk. .Push instead of pull whenever possible.   Ask For Help   Ask for help and delegate to others when possible. Coordinate your movements when lifting together, and maintain the low back curve.  Log Roll   Lying on back, bend left knee and place left arm across chest. Roll all in one movement to the right. Reverse to roll to the left. Always move as one unit. Housework - Sweeping  Use long-handled equipment to avoid stooping.   Housework - Wiping  Position yourself as close as possible to reach work surface. Avoid straining your back.  Laundry - Unloading Wash   To unload small items at bottom of washer, lift leg opposite to arm being used to reach.  [place] close to area to be raked. Use arm movements to do the work. Keep back straight and avoid twisting.     Cart  When reaching into cart with one arm, lift opposite leg to keep back straight.   Getting Into / Out of Bed  Lower self to lie down on one side by raising legs and lowering head at the same time. Use arms to assist moving without twisting. Bend both knees to roll onto back if desired. To sit up, start from lying on side, and use same move-ments in reverse. Housework - Vacuuming  Hold the vacuum with arm held at side. Step back and forth to move it, keeping head up. Avoid twisting.   Laundry - IT consultant so that bending and twisting can be avoided.   Laundry - Unloading Dryer  Squat down to reach into clothes dryer or use a reacher.  Gardening - Weeding / Probation officer or Kneel. Knee pads may be  helpful.                    TENS UNIT: This is helpful for muscle pain and spasm.   Search and Purchase a TENS 7000 2nd edition at www.tenspros.com. It should be less than $30.     TENS unit instructions: Do not shower or bathe with the unit on Turn the unit off before removing electrodes or batteries If the electrodes lose stickiness add a drop of water to the electrodes after they are disconnected from the unit and place on plastic sheet. If you continued to have difficulty, call the TENS unit company to purchase more electrodes. Do not apply lotion on the skin area prior to use. Make sure the skin is clean and dry as this will help prolong the life of the electrodes. After use, always check skin for  unusual red areas, rash or other skin difficulties. If there are any skin problems, does not apply electrodes to the same area. Never remove the electrodes from the unit by pulling the wires. Do not use the TENS unit or electrodes other than as directed. Do not change electrode placement without consultating your therapist or physician. Keep 2 fingers with between each electrode.

## 2015-04-10 ENCOUNTER — Ambulatory Visit (INDEPENDENT_AMBULATORY_CARE_PROVIDER_SITE_OTHER): Payer: BLUE CROSS/BLUE SHIELD | Admitting: Physical Therapy

## 2015-04-10 DIAGNOSIS — Z7409 Other reduced mobility: Secondary | ICD-10-CM

## 2015-04-10 DIAGNOSIS — M79605 Pain in left leg: Principal | ICD-10-CM

## 2015-04-10 DIAGNOSIS — M623 Immobility syndrome (paraplegic): Secondary | ICD-10-CM

## 2015-04-10 DIAGNOSIS — M7918 Myalgia, other site: Secondary | ICD-10-CM

## 2015-04-10 DIAGNOSIS — M545 Low back pain, unspecified: Secondary | ICD-10-CM

## 2015-04-10 DIAGNOSIS — R531 Weakness: Secondary | ICD-10-CM

## 2015-04-10 DIAGNOSIS — M2560 Stiffness of unspecified joint, not elsewhere classified: Secondary | ICD-10-CM

## 2015-04-10 DIAGNOSIS — M797 Fibromyalgia: Secondary | ICD-10-CM | POA: Diagnosis not present

## 2015-04-10 NOTE — Therapy (Addendum)
[place], [place], [address] Phone: [phone]   Fax:  [phone]  Physical Therapy Treatment  Patient Details  Name: Rachel Mack MRN: [id number] Date of Birth: 1960/11/25 Referring Provider: Dr. Lynne Leader  Encounter Date: 04/10/2015      PT End of Session - 04/10/15 1030    Visit Number 2   Number of Visits 12   Date for PT Re-Evaluation 05/15/15   PT Start Time 7681   PT Stop Time 1107   PT Time Calculation (min) 44 min      Past Medical History  Diagnosis Date  . Hypothyroidism   . Perimenopausal     helped regulate menses  . Vitamin B 12 deficiency   . Hypertension   . Nephrolithiasis     nonobstructive; has never passed one    Past Surgical History  Procedure Laterality Date  . Gynecologic cryosurgery  2005 or before    cervicitis (Dr. Lissa Hoard in [place])    There were no vitals filed for this visit.  Visit Diagnosis:  Lumbar pain with radiation down left leg  Stiffness due to immobility  Myofacial muscle pain  Decreased strength, endurance, and mobility      Subjective Assessment - 04/10/15 1030    Subjective Pt reports not much has changed since last visit.  Pain persists in Lt ant thigh and shin, in lateral hip.  "Comes and goes; worse at night".    Currently in Pain? Yes   Pain Score 6    Pain Location Leg (ant thigh and ant shin)   Pain Orientation Left   Pain Descriptors / Indicators Throbbing   Pain Frequency Intermittent   Aggravating Factors  lay down at night    Pain Relieving Factors tylenol, cream from MD, heat             [facility] PT Assessment - 04/10/15 0001    Assessment   Medical Diagnosis Trochanteric bursitis Lt; LBP with Lt LE pain   Referring Provider Dr. Lynne Leader   Onset Date/Surgical Date 09/23/14   Hand Dominance Right          [facility] Adult PT Treatment/Exercise - 04/10/15 0001    Lumbar Exercises: Stretches    Passive Hamstring Stretch 3 reps;30 seconds  supine with strap   Press Ups --  2-3 sec hold x10    ITB Stretch 30 seconds;2 reps  each side with strap   Piriformis Stretch 2 reps;30 seconds   Lumbar Exercises: Aerobic   Stationary Bike NuStep L4: 6 min    Lumbar Exercises: Supine   Ab Set 5 reps;5 seconds   Clam 10 reps  each leg, with ab set    Heel Slides 10 reps  each leg, with ab set    Bent Knee Raise 10 reps  each leg, with ab set    Lumbar Exercises: Prone   Other Prone Lumbar Exercises prone pelvic press  x 5 sec hold x 10    Modalities   Modalities --  pt declined   Manual Therapy   Manual Therapy Soft tissue mobilization   Manual therapy comments pt hooklying    Soft tissue mobilization TPR to Lt lumbar paraspinals                 PT Education - 04/10/15 1233    Education provided Yes   Education Details HEP    Person(s) Educated Patient   Methods Handout;Explanation  Comprehension Verbalized understanding;Returned demonstration     Pt educated regarding engaging core muscles during transitional movements, reaching, squatting, etc.           PT Long Term Goals - 04/03/15 1303    PT LONG TERM GOAL #1   Title Improve sitting; standing tolerance to 1 hours 05/15/15   Time 6   Period Weeks   Status New   PT LONG TERM GOAL #2   Title 5/5 bilat hip ext 05/15/15   Time 6   Period Weeks   Status New   PT LONG TERM GOAL #3   Title Improve spinal mobility/hamstring flexibility to [facility] 05/15/15   Time 6   Period Weeks   Status New   PT LONG TERM GOAL #4   Title I in HEP 05/15/15   Time 6   Period Weeks   Status New   PT LONG TERM GOAL #5   Title Improve FOTO to </= 34% limitation 05/15/15   Time 6   Period Weeks   Status New               Plan - 04/10/15 1224    Clinical Impression Statement Pt tolerated all exercises without increase in pain.  Pt reported decrease of symptoms at end of session and declined any use of modalities as  she will do this at home if needed.  Progressing towards goals.    Pt will benefit from skilled therapeutic intervention in order to improve on the following deficits Improper body mechanics;Decreased range of motion;Decreased mobility;Decreased endurance;Decreased activity tolerance;Increased fascial restricitons;Pain   Rehab Potential Good   PT Frequency 2x / week   PT Duration 6 weeks   PT Treatment/Interventions Patient/family education;ADLs/Self Care Home Management;Therapeutic exercise;Therapeutic activities;Neuromuscular re-education;Manual techniques;Dry needling;Cryotherapy;Electrical Stimulation;Moist Heat;Ultrasound   PT Next Visit Plan Continue progressive core strengthening and LE stretching.  Manual/ TDN as time allows.     Consulted and Agree with Plan of Care Patient        Problem List Patient Active Problem List   Diagnosis Date Noted  . Lumbar pain with radiation down left leg 03/03/2015  . Trochanteric bursitis of left hip 03/03/2015  . Abdominal pain 01/07/2015  . Urinary tract infectious disease 06/05/2014  . Preventative health care 07/27/2013  . Inverted nipple 02/06/2013  . Vitamin D deficiency 01/27/2012  . Insomnia 11/01/2011  . Pericardial effusion 01/08/2011  . Essential hypertension, benign 09/16/2010  . FOOT PAIN, RIGHT 01/19/2010  . B12 DEFICIENCY 12/26/2008  . IRREGULAR MENSES 07/31/2008  . UNSPECIFIED HYPOTHYROIDISM 04/25/2008  . DEPRESSION, MILD 04/25/2008  . PERIMENOPAUSAL STATUS 04/25/2008    [name], PTA 04/10/2015 12:33 PM  [place], [place], [address] Phone: [phone]   Fax:  [phone]  Name: Rachel Mack MRN: [id number] Date of Birth: 1960/05/02    PHYSICAL THERAPY DISCHARGE SUMMARY  Visits from Start of Care: 2  Current functional level related to goals / functional outcomes: Little change in symptoms at second visit    Remaining deficits: unknown   Education / Equipment: HEP Plan: Patient agrees to discharge.  Patient goals were not met. Patient is being discharged due to not returning since the last visit.  ?????     Celyn P. Helene Kelp PT, MPH 05/14/2015 8:02 AM

## 2015-04-10 NOTE — Patient Instructions (Addendum)
  Abdominal Bracing With Pelvic Floor (Hook-Lying)   With neutral spine, tighten pelvic floor and abdominals. Hold 10 seconds. Repeat __10_ times. Do _1__ times a day.   Knee to Chest: Transverse Plane Stability   Bring one knee up, then return. Be sure pelvis does not roll side to side. Keep pelvis still. Lift knee __10_ times each leg. Restabilize pelvis. Repeat with other leg. Do _1-2__ sets, _1__ times per day.  Hip External Rotation With Pillow: Transverse Plane Stability   KEEP BOTH KNEES BENT. Slowly roll bent knee out. Be sure pelvis does not rotate. Do _10__ times. Restabilize pelvis. Repeat with other leg. Do _1-2__ sets, _1__ times per day.  Heel Slide: 4-10 Inches - Transverse Plane Stability   Slide heel 4 inches down. Be sure pelvis does not rotate. Do _10__ times. Restabilize pelvis. Repeat with other leg. Do __1_ sets, _1__ times per day.  Pelvic Press     Place hands under belly between navel and pubic bone, palms up. Feel pressure on hands. Increase pressure on hands by pressing pelvis down. This is NOT a pelvic tilt. Hold __5_ seconds. Relax. Repeat _10__ times. Once a day.  Stretches every day.  Above strengthening exercises 3-4x/wk.   [facility] [address], [place] [address]  [phone] (office) [phone] (fax)  Outer Hip Stretch: Reclined IT Band Stretch (Strap)    Strap around opposite foot, pull across only as far as possible with shoulders on mat. Hold for __30__ sec. Repeat __2-3__ times each leg.

## 2015-04-16 ENCOUNTER — Telehealth: Payer: Self-pay | Admitting: *Deleted

## 2015-04-16 NOTE — Telephone Encounter (Signed)
There patient left a message stating that her insurance company says that her bone density exam needs to be coded as "wellness" in order for it to be completely covered by the insurance.Called patient  for clarification and again she just states the same thing. I told the patient that unless she was having a wellness exam I didn't think it could be coded that way and even then usually its coded as screening for osteoporosis. She asked that the phone note be sent to provider. I did let her know that I would send a phone note but she may need to call her insurance company for clarification in the meantime.

## 2015-04-16 NOTE — Telephone Encounter (Signed)
Ok to code as screen and wellness exam to see if will covere.

## 2015-04-17 ENCOUNTER — Ambulatory Visit (INDEPENDENT_AMBULATORY_CARE_PROVIDER_SITE_OTHER): Payer: BLUE CROSS/BLUE SHIELD

## 2015-04-17 ENCOUNTER — Telehealth: Payer: Self-pay | Admitting: *Deleted

## 2015-04-17 ENCOUNTER — Other Ambulatory Visit: Payer: BLUE CROSS/BLUE SHIELD

## 2015-04-17 DIAGNOSIS — M810 Age-related osteoporosis without current pathological fracture: Secondary | ICD-10-CM | POA: Diagnosis not present

## 2015-04-17 DIAGNOSIS — Z0000 Encounter for general adult medical examination without abnormal findings: Secondary | ICD-10-CM

## 2015-04-17 DIAGNOSIS — M8580 Other specified disorders of bone density and structure, unspecified site: Secondary | ICD-10-CM

## 2015-04-17 DIAGNOSIS — Z13820 Encounter for screening for osteoporosis: Secondary | ICD-10-CM

## 2015-04-17 NOTE — Telephone Encounter (Signed)
Put new order in and changed codes to wellness exam and screening for osteoporosis

## 2015-04-17 NOTE — Telephone Encounter (Signed)
The patient called back and states her insurance specifically states to code the bone density as wellness routine screening for osteoporosis

## 2015-04-18 ENCOUNTER — Encounter: Payer: Self-pay | Admitting: Family Medicine

## 2015-04-18 DIAGNOSIS — M8580 Other specified disorders of bone density and structure, unspecified site: Secondary | ICD-10-CM

## 2015-04-18 HISTORY — DX: Other specified disorders of bone density and structure, unspecified site: M85.80

## 2015-04-18 NOTE — Addendum Note (Signed)
Addended by: Teddy Spike on: 04/18/2015 12:43 PM   Modules accepted: Orders

## 2015-04-20 ENCOUNTER — Other Ambulatory Visit: Payer: Self-pay | Admitting: Family Medicine

## 2015-04-22 ENCOUNTER — Encounter: Payer: BLUE CROSS/BLUE SHIELD | Admitting: Physical Therapy

## 2015-05-01 NOTE — Telephone Encounter (Signed)
This was already taken care of.

## 2015-05-07 ENCOUNTER — Other Ambulatory Visit: Payer: Self-pay | Admitting: Family Medicine

## 2015-05-12 ENCOUNTER — Telehealth: Payer: Self-pay

## 2015-05-12 NOTE — Telephone Encounter (Signed)
She is actually due for a well woman exam. I encourage her to make an appointment and that way we can order her blood work as part of her physical and it would be better covered by her insurance.

## 2015-05-13 NOTE — Telephone Encounter (Signed)
Patient advised and sent to scheduling.

## 2015-05-16 ENCOUNTER — Encounter: Payer: Self-pay | Admitting: Family Medicine

## 2015-05-16 ENCOUNTER — Ambulatory Visit (INDEPENDENT_AMBULATORY_CARE_PROVIDER_SITE_OTHER): Payer: BLUE CROSS/BLUE SHIELD | Admitting: Family Medicine

## 2015-05-16 ENCOUNTER — Ambulatory Visit: Payer: BLUE CROSS/BLUE SHIELD | Admitting: Family Medicine

## 2015-05-16 VITALS — BP 118/85 | HR 74 | Ht 64.5 in | Wt 140.0 lb

## 2015-05-16 DIAGNOSIS — Z1159 Encounter for screening for other viral diseases: Secondary | ICD-10-CM | POA: Diagnosis not present

## 2015-05-16 DIAGNOSIS — N912 Amenorrhea, unspecified: Secondary | ICD-10-CM

## 2015-05-16 DIAGNOSIS — Z0189 Encounter for other specified special examinations: Secondary | ICD-10-CM | POA: Diagnosis not present

## 2015-05-16 DIAGNOSIS — Z114 Encounter for screening for human immunodeficiency virus [HIV]: Secondary | ICD-10-CM | POA: Diagnosis not present

## 2015-05-16 DIAGNOSIS — Z0000 Encounter for general adult medical examination without abnormal findings: Secondary | ICD-10-CM

## 2015-05-16 NOTE — Patient Instructions (Signed)
Keep up a regular exercise program and make sure you are eating a healthy diet Try to eat 4 servings of dairy a day, or if you are lactose intolerant take a calcium with vitamin D daily.  Your vaccines are up to date.   

## 2015-05-16 NOTE — Progress Notes (Signed)
Subjective:     Rachel Mack is a 55 y.o. female and is here for a comprehensive physical exam. The patient reports no problems.  Social History   Social History  . Marital Status: Married    Spouse Name: N/A  . Number of Children: 4  . 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 Smoking status: Former Smoker    Quit date: 02/23/2004  . Smokeless tobacco: Never Used  . Alcohol Use: No  . Drug Use: No  . Sexual Activity: Not on file     Comment: Works out regularly, married, 4 sons, youngest .   Other Topics Concern  . Not on file   Social History Narrative   Married, 4 children (32y, 28y, 21y, [place]).   Orig from [place] in [place], currently lives in [place].   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   Cigs: none   Alc: occ.  No drugs.   Exercise: no   Health Maintenance  Topic Date Due  . Hepatitis C Screening  05/15/2020 (Originally August 07, 1960)  . HIV Screening  05/15/2020 (Originally 01/14/1976)  . INFLUENZA VACCINE  09/23/2015  . PAP SMEAR  10/11/2016  . MAMMOGRAM  12/04/2016  . TETANUS/TDAP  12/27/2018  . COLONOSCOPY  06/13/2024    The following portions of the patient's history were reviewed and updated as appropriate: allergies, current medications, past family history, past medical history, past social history, past surgical history and problem list.  Review of Systems Pertinent items noted in HPI and remainder of comprehensive ROS otherwise negative.   Objective:    BP 118/85 mmHg  Pulse 74  Ht 5' 4.5" (1.638 m)  Wt 140 lb (63.504 kg)  BMI 23.67 kg/m2 General appearance: alert, cooperative and appears stated age Head: Normocephalic, without obvious abnormality, atraumatic Eyes: cnj clear, EOMI, pEEERLA Ears: normal TM's and external ear canals both ears Nose: Nares normal. Septum midline. Mucosa normal. No drainage or sinus tenderness. Throat: lips, mucosa, and tongue normal; teeth and gums  normal Neck: no adenopathy, no carotid bruit, no JVD, supple, symmetrical, trachea midline and thyroid not enlarged, symmetric, no tenderness/mass/nodules Back: symmetric, no curvature. ROM normal. No CVA tenderness. Lungs: clear to auscultation bilaterally Breasts: normal appearance, no masses or tenderness, inverted nipple on the right breast Heart: regular rate and rhythm, S1, S2 normal, no murmur, click, rub or gallop Abdomen: soft, non-tender; bowel sounds normal; no masses,  no organomegaly Extremities: extremities normal, atraumatic, no cyanosis or edema Pulses: 2+ and symmetric Skin: Skin color, texture, turgor normal. No rashes or lesions Lymph nodes: Cervical, supraclavicular, and axillary nodes normal. Neurologic: Alert and oriented X 3, normal strength and tone. Normal symmetric reflexes. Normal coordination and gait    Assessment:    Healthy female exam.      Plan:     See After Visit Summary for Counseling Recommendations  Keep up a regular exercise program and make sure you are eating a healthy diet Try to eat 4 servings of dairy a day, or if you are lactose intolerant take a calcium with vitamin D daily.  Your vaccines are up to date.   Left leg pain - She still having left upper leg pain. She tried physical therapy really did not get any improvement or relief. She even bought her own TENS unit. Encouraged her to get back in with Dr. Georgina Snell so that they can move  forward. She may need some imaging etc. I think he felt that her symptoms were primarily coming from her lumbar spine.  She is okay with hepatitis C and HIV screening today.  She would also like to go ahead and get her vitamin D level done that she was recently diagnosed with osteopenia.

## 2015-05-17 LAB — ESTRADIOL: ESTRADIOL: 21 pg/mL

## 2015-05-17 LAB — COMPLETE METABOLIC PANEL WITH GFR
ALT: 12 U/L (ref 6–29)
AST: 14 U/L (ref 10–35)
Albumin: 4.7 g/dL (ref 3.6–5.1)
Alkaline Phosphatase: 53 U/L (ref 33–130)
BUN: 16 mg/dL (ref 7–25)
CHLORIDE: 101 mmol/L (ref 98–110)
CO2: 27 mmol/L (ref 20–31)
Calcium: 10.2 mg/dL (ref 8.6–10.4)
Creat: 0.81 mg/dL (ref 0.50–1.05)
GFR, EST NON AFRICAN AMERICAN: 84 mL/min (ref >=60)
GFR, Est African American: >89 mL/min (ref >=60)
GLUCOSE: 78 mg/dL (ref 65–99)
POTASSIUM: 3.5 mmol/L (ref 3.5–5.3)
SODIUM: 140 mmol/L (ref 135–146)
Total Bilirubin: 0.5 mg/dL (ref 0.2–1.2)
Total Protein: 7.3 g/dL (ref 6.1–8.1)

## 2015-05-17 LAB — PROGESTERONE: Progesterone: <0.5 ng/mL

## 2015-05-17 LAB — LIPID PANEL
CHOL/HDL RATIO: 2.9 ratio (ref <=5.0)
Cholesterol: 157 mg/dL (ref 125–200)
HDL: 55 mg/dL (ref >=46)
LDL CALC: 83 mg/dL (ref <130)
TRIGLYCERIDES: 96 mg/dL (ref <150)
VLDL: 19 mg/dL (ref <30)

## 2015-05-17 LAB — HEPATITIS C ANTIBODY: HCV AB: NEGATIVE

## 2015-05-17 LAB — LUTEINIZING HORMONE: LH: 88.6 m[IU]/mL — AB

## 2015-05-17 LAB — FOLLICLE STIMULATING HORMONE: FSH: 165.9 m[IU]/mL — ABNORMAL HIGH

## 2015-05-17 LAB — VITAMIN D 25 HYDROXY (VIT D DEFICIENCY, FRACTURES): Vit D, 25-Hydroxy: 29 ng/mL — ABNORMAL LOW (ref 30–100)

## 2015-05-17 LAB — HIV ANTIBODY (ROUTINE TESTING W REFLEX): HIV 1&2 Ab, 4th Generation: NONREACTIVE

## 2015-08-14 ENCOUNTER — Ambulatory Visit (INDEPENDENT_AMBULATORY_CARE_PROVIDER_SITE_OTHER): Payer: BLUE CROSS/BLUE SHIELD | Admitting: Family Medicine

## 2015-08-14 ENCOUNTER — Encounter: Payer: Self-pay | Admitting: Family Medicine

## 2015-08-14 VITALS — BP 111/80 | HR 74 | Wt 142.0 lb

## 2015-08-14 DIAGNOSIS — M545 Low back pain: Secondary | ICD-10-CM

## 2015-08-14 DIAGNOSIS — M7062 Trochanteric bursitis, left hip: Secondary | ICD-10-CM

## 2015-08-14 DIAGNOSIS — M79605 Pain in left leg: Principal | ICD-10-CM

## 2015-08-14 NOTE — Patient Instructions (Signed)
Thank you for coming in today. Return a few days following MRI for discussion of results.  Come back or go to the emergency room if you notice new weakness new numbness problems walking or bowel or bladder problems.

## 2015-08-14 NOTE — Progress Notes (Signed)
Rachel Mack is a 55 y.o. female who presents to [facility] [place] today for follow-up lateral hip pain and lateral leg pain.  Patient has been seen several times for back leg and hip pain. She's had some visits physical therapy and a lot of home exercise program and really hasn't expresses no benefit. She experienced significant left lateral hip pain and pain radiating to the lateral calf. Symptoms are worse with exercise and especially bothersome at night when laying on her back. She notes the radiating pain to the calf is worse when she lays on her back at night. She denies any weakness but does note some numbness and tingling to the left lateral toes. No injury fevers chills nausea vomiting or diarrhea.   Past Medical History  Diagnosis Date  . Hypothyroidism   . Perimenopausal     helped regulate menses  . Vitamin B 12 deficiency   . Hypertension   . Nephrolithiasis     nonobstructive; has never passed one   Past Surgical History  Procedure Laterality Date  . Gynecologic cryosurgery  2005 or before    cervicitis (Dr. Lissa Hoard in [place])   Social History  Substance Use Topics  . Smoking status: Former Smoker    Quit date: 02/23/2004  . Smokeless tobacco: Never Used  . Alcohol Use: No   family history includes Breast cancer (age of onset: 46) in her mother; Colon cancer (age of onset: 65) in her paternal grandmother; Diabetes in her mother; Drug abuse in her sister; Hyperlipidemia in her mother; Hypertension in her mother.  ROS as above:  Medications: Current Outpatient Prescriptions  Medication Sig Dispense Refill  . ALPRAZolam (XANAX) 0.25 MG tablet TAKE 1 TABLET BY MOUTH AT BEDTIME AS NEEDED SLEEP 30 tablet 1  . cyanocobalamin (,VITAMIN B-12,) 1000 MCG/ML injection Inject 1 mL (1,000 mcg total) into the muscle once. 3 mL 2  . hydrochlorothiazide  (MICROZIDE) 12.5 MG capsule TAKE 1 CAPSULE EVERY MORNING 90 capsule 2  . MISC NATURAL PRODUCTS PO Take by mouth. Estrogen Balance    . Nutritional Supplements (MENOPAUSE FORMULA PO) Take by mouth. Menocare    . SYNTHROID 50 MCG tablet TAKE 1 TABLET (50 MCG TOTAL) BY MOUTH DAILY. BRAND MEDICALLY NECCESSARY 30 tablet 6   No current facility-administered medications for this visit.   Allergies  Allergen Reactions  . Amoxicillin     REACTION: yeast infection  . Bupropion Hcl     REACTION: heart palpitations  . Sulfa Antibiotics      Exam:  BP 111/80 mmHg  Pulse 74  Wt 142 lb (64.411 kg) Gen: Well NAD HEENT: EOMI,  MMM Lungs: Normal work of breathing. CTABL Heart: RRR no MRG Abd: NABS, Soft. Nondistended, Nontender Exts: Brisk capillary refill, warm and well perfused.  MSK: Nontender to spinal midline. Normal back motion Positive left-sided slump test Hip well-appearing normal motion bilaterally. Tender palpation left greater trochanter Strength is diminished 4/5 left hip abduction Lower extremity sensation capillary refill are intact throughout. Normal Normal gait.   X-ray L-spine dated 03/03/2015 reviewed Xray Hip dated 10/09/2014 reviewed  No results found for this or any previous visit (from the past 24 hour(s)). No results found.    Assessment and Plan: 55 y.o. female with  1) greater trochanteric bursitis moderate failing conservative management. Discussed options. Patient cannot afford physical therapy at this time. Plan for redo medication of home physical therapy exercises.  2) left  lumbar radiculopathy likely at L4-L5. X-ray unremarkable. Patient has failed conservative management. Plan for MRI L-spine for injection planning potentially.  Discussed warning signs or symptoms. Please see discharge instructions. Patient expresses understanding.

## 2015-08-18 ENCOUNTER — Other Ambulatory Visit: Payer: BLUE CROSS/BLUE SHIELD

## 2015-08-21 ENCOUNTER — Telehealth: Payer: Self-pay | Admitting: Family Medicine

## 2015-08-21 DIAGNOSIS — M5416 Radiculopathy, lumbar region: Secondary | ICD-10-CM

## 2015-08-21 NOTE — Telephone Encounter (Signed)
The MRI from [facility] shows a bulging disc at the L4-L5 and L5-S1 levels pressing on the nerve.  This is likely to improve with special injection by an interventional radiologist.  I will order this procedure. Make sure to pick up your image CD and take it with you to the appointment.

## 2015-08-21 NOTE — Telephone Encounter (Signed)
Pt notified of results and recommendation. Disk and note placed up front for pickup. Called [facility] to contact pt for scheduling.

## 2015-08-22 ENCOUNTER — Other Ambulatory Visit: Payer: Self-pay

## 2015-08-22 MED ORDER — ALPRAZOLAM 0.25 MG PO TABS: 0.2500 mg | ORAL_TABLET | Freq: Every evening | ORAL | Status: DC | PRN

## 2015-09-01 ENCOUNTER — Ambulatory Visit
Admission: RE | Admit: 2015-09-01 | Discharge: 2015-09-01 | Disposition: A | Discharge status: Home / Self Care | Payer: BLUE CROSS/BLUE SHIELD | Source: Ambulatory Visit | Attending: Family Medicine | Admitting: Family Medicine

## 2015-09-01 MED ORDER — METHYLPREDNISOLONE ACETATE 40 MG/ML INJ SUSP (RADIOLOG
120.0000 mg | Freq: Once | INTRAMUSCULAR | Status: AC
  Administered 2015-09-01: 120 mg via EPIDURAL

## 2015-09-01 MED ORDER — IOPAMIDOL (ISOVUE-M 200) INJECTION 41%
1.0000 mL | Freq: Once | INTRAMUSCULAR | Status: AC
  Administered 2015-09-01: 1 mL via EPIDURAL

## 2015-09-01 NOTE — Discharge Instructions (Signed)

## 2015-09-18 ENCOUNTER — Other Ambulatory Visit: Payer: Self-pay | Admitting: Family Medicine

## 2015-10-06 ENCOUNTER — Other Ambulatory Visit: Payer: Self-pay | Admitting: Family Medicine

## 2015-10-22 ENCOUNTER — Ambulatory Visit (INDEPENDENT_AMBULATORY_CARE_PROVIDER_SITE_OTHER): Payer: BLUE CROSS/BLUE SHIELD | Admitting: Family Medicine

## 2015-10-22 ENCOUNTER — Encounter: Payer: Self-pay | Admitting: Family Medicine

## 2015-10-22 VITALS — BP 116/79 | HR 60 | Temp 98.2°F | Resp 18 | Wt 141.1 lb

## 2015-10-22 DIAGNOSIS — M545 Low back pain, unspecified: Secondary | ICD-10-CM

## 2015-10-22 DIAGNOSIS — M79605 Pain in left leg: Principal | ICD-10-CM

## 2015-10-22 NOTE — Patient Instructions (Signed)
Thank you for coming in today. Return as needed.  Let me know if you do not hear anything.

## 2015-10-22 NOTE — Progress Notes (Signed)
Rachel Mack is a 55 y.o. female who presents to [facility] [place] today for follow-up left lumbar radiculopathy. Patient has been evaluated and found to have left-sided L4-L5 lumbar radiculopathy. She received a epidural steroid injection in July which helped well until recently. She would like a repeat epidural steroid injection if possible.   Past Medical History:  Diagnosis Date  . Hypertension   . Hypothyroidism   . Nephrolithiasis    nonobstructive; has never passed one  . Perimenopausal    helped regulate menses  . Vitamin B 12 deficiency    Past Surgical History:  Procedure Laterality Date  . GYNECOLOGIC CRYOSURGERY  2005 or before   cervicitis (Dr. Lissa Hoard in [place])   Social History  Substance Use Topics  . Smoking status: Former Smoker    Quit date: 02/23/2004  . Smokeless tobacco: Never Used  . Alcohol use No   family history includes Breast cancer (age of onset: 43) in her mother; Colon cancer (age of onset: 12) in her paternal grandmother; Diabetes in her mother; Drug abuse in her sister; Hyperlipidemia in her mother; Hypertension in her mother.  ROS as above:  Medications: Current Outpatient Prescriptions  Medication Sig Dispense Refill  . ALPRAZolam (XANAX) 0.25 MG tablet Take 1 tablet (0.25 mg total) by mouth at bedtime as needed for sleep. 30 tablet 1  . cyanocobalamin (,VITAMIN B-12,) 1000 MCG/ML injection INJECT 1ML (1000MCG TOTAL) INTO THE MUSCLE ONCE AS    DIRECTED DISCARD 28 DAYS   AFTER  FIRST USE 3 mL 2  . hydrochlorothiazide (MICROZIDE) 12.5 MG capsule TAKE 1 CAPSULE EVERY       MORNING 90 capsule 2  . MISC NATURAL PRODUCTS PO Take by mouth. Estrogen Balance    . Nutritional Supplements (MENOPAUSE FORMULA PO) Take by mouth. Menocare    . SYNTHROID 50 MCG tablet TAKE 1 TABLET (50 MCG TOTAL) BY MOUTH DAILY. BRAND MEDICALLY  NECCESSARY 30 tablet 6   No current facility-administered medications for this visit.    Allergies  Allergen Reactions  . Amoxicillin     REACTION: yeast infection  . Bupropion Hcl     REACTION: heart palpitations  . Sulfa Antibiotics      Exam:  BP 116/79 (BP Location: Right Arm, Patient Position: Sitting, Cuff Size: Normal)   Pulse 60   Temp 98.2 F (36.8 C) (Oral)   Resp 18   Wt 141 lb 1.3 oz (64 kg)   SpO2 100%   BMI 23.84 kg/m  Gen: Well NAD L-spine nontender to midline. Positive left-sided slump test. Lower extremity reflexes are equal and normal throughout. Strength is intact. Normal gait.  No results found for this or any previous visit (from the past 24 hour(s)). No results found.    Assessment and Plan: 55 y.o. female with continued lumbar radiculopathy. Repeat epidural steroid injection. Return as needed. If patient requests third injection we will authorize without need for follow-up visit.   Orders Placed This Encounter  Procedures  . DG Epidurography    Order Specific Question:   Reason for Exam (SYMPTOM  OR DIAGNOSIS REQUIRED)    Answer:   L4-5    Order Specific Question:   Is the patient pregnant?    Answer:   No    Order Specific Question:   Preferred imaging location?    Answer:   [id number]    Discussed warning signs or symptoms. Please see  discharge instructions. Patient expresses understanding.

## 2015-10-22 NOTE — Progress Notes (Signed)
Here to discuss referral for spine injection.

## 2015-10-24 ENCOUNTER — Other Ambulatory Visit: Payer: Self-pay | Admitting: *Deleted

## 2015-10-24 MED ORDER — SYNTHROID 50 MCG PO TABS: ORAL_TABLET | ORAL | 1 refills | Status: DC

## 2015-10-31 ENCOUNTER — Ambulatory Visit
Admission: RE | Admit: 2015-10-31 | Discharge: 2015-10-31 | Disposition: A | Discharge status: Home / Self Care | Payer: BLUE CROSS/BLUE SHIELD | Source: Ambulatory Visit | Attending: Family Medicine | Admitting: Family Medicine

## 2015-10-31 MED ORDER — METHYLPREDNISOLONE ACETATE 40 MG/ML INJ SUSP (RADIOLOG
120.0000 mg | Freq: Once | INTRAMUSCULAR | Status: AC
  Administered 2015-10-31: 120 mg via EPIDURAL

## 2015-10-31 MED ORDER — IOPAMIDOL (ISOVUE-M 200) INJECTION 41%
1.0000 mL | Freq: Once | INTRAMUSCULAR | Status: AC
  Administered 2015-10-31: 1 mL via EPIDURAL

## 2015-11-10 ENCOUNTER — Other Ambulatory Visit: Payer: Self-pay | Admitting: *Deleted

## 2015-11-10 DIAGNOSIS — Z1231 Encounter for screening mammogram for malignant neoplasm of breast: Secondary | ICD-10-CM

## 2015-11-14 ENCOUNTER — Other Ambulatory Visit: Payer: Self-pay | Admitting: Family Medicine

## 2015-11-26 ENCOUNTER — Ambulatory Visit: Payer: BLUE CROSS/BLUE SHIELD | Admitting: Osteopathic Medicine

## 2015-11-27 ENCOUNTER — Ambulatory Visit (INDEPENDENT_AMBULATORY_CARE_PROVIDER_SITE_OTHER): Payer: BLUE CROSS/BLUE SHIELD | Admitting: Sports Medicine

## 2015-11-27 ENCOUNTER — Encounter: Payer: Self-pay | Admitting: Sports Medicine

## 2015-11-27 DIAGNOSIS — H6993 Unspecified Eustachian tube disorder, bilateral: Secondary | ICD-10-CM

## 2015-11-27 DIAGNOSIS — H6983 Other specified disorders of Eustachian tube, bilateral: Secondary | ICD-10-CM | POA: Diagnosis not present

## 2015-11-27 HISTORY — DX: Other specified disorders of eustachian tube, bilateral: H69.83

## 2015-11-27 HISTORY — DX: Unspecified eustachian tube disorder, bilateral: H69.93

## 2015-11-27 MED ORDER — FLUTICASONE PROPIONATE 50 MCG/ACT NA SUSP: NASAL | 3 refills | Status: DC

## 2015-11-27 MED ORDER — AZITHROMYCIN 250 MG PO TABS: ORAL_TABLET | ORAL | 0 refills | Status: DC

## 2015-11-27 NOTE — Progress Notes (Signed)
  Subjective:    CC: Abnormal sounds in the ears  HPI: For the past several weeks this pleasant 55 year old female with a history of chronic sinusitis has had whooshing sounds in both ears, moderate, persistent, no headaches, visual changes, no dizziness, no tinnitus.   Skin lesion on wrist: Sometime ago had a wart frozen from her right volar wrist, has some on scar tissue visible, wonders what this is.  Past medical history:  Negative.  See flowsheet/record as well for more information.  Surgical history: Negative.  See flowsheet/record as well for more information.  Family history: Negative.  See flowsheet/record as well for more information.  Social history: Negative.  See flowsheet/record as well for more information.  Allergies, and medications have been entered into the medical record, reviewed, and no changes needed.   Review of Systems: No fevers, chills, night sweats, weight loss, chest pain, or shortness of breath.   Objective:    General: Well Developed, well nourished, and in no acute distress.  Neuro: Alert and oriented x3, extra-ocular muscles intact, sensation grossly intact.  HEENT: Normocephalic, atraumatic, pupils equal round reactive to light, neck supple, no masses, no lymphadenopathy, thyroid nonpalpable. Oropharynx, nasopharynx, ear canals are unremarkable. Skin: Warm and dry, no rashes. Cardiac: Regular rate and rhythm, no murmurs rubs or gallops, no lower extremity edema.  Respiratory: Clear to auscultation bilaterally. Not using accessory muscles, speaking in full sentences. Right wrist: There is a circular, raised lesion with central clearing, no excoriation, no scaling.   Procedure:  Cryodestruction of right wrist raised lesion, suspect remnant from verruca cryotherapy in the past Consent obtained and verified. Time-out conducted. Noted no overlying erythema, induration, or other signs of local infection. Completed without difficulty using Cryo-Gun. Advised  to call if fevers/chills, erythema, induration, drainage, or persistent bleeding. She did get some paresthesias in a median nerve distribution, these resolved quickly.  Impression and Recommendations:    Eustachian tube dysfunction, bilateral Azithromycin, Flonase. Return in 2 weeks, referral to ENT if no better.

## 2015-11-27 NOTE — Assessment & Plan Note (Signed)
Azithromycin, Flonase. Return in 2 weeks, referral to ENT if no better.

## 2015-12-12 ENCOUNTER — Ambulatory Visit (INDEPENDENT_AMBULATORY_CARE_PROVIDER_SITE_OTHER): Payer: BLUE CROSS/BLUE SHIELD

## 2015-12-12 DIAGNOSIS — Z1231 Encounter for screening mammogram for malignant neoplasm of breast: Secondary | ICD-10-CM

## 2015-12-25 ENCOUNTER — Ambulatory Visit: Payer: BLUE CROSS/BLUE SHIELD | Admitting: Sports Medicine

## 2016-01-05 ENCOUNTER — Encounter: Payer: Self-pay | Admitting: Family Medicine

## 2016-01-05 ENCOUNTER — Ambulatory Visit (INDEPENDENT_AMBULATORY_CARE_PROVIDER_SITE_OTHER): Payer: BLUE CROSS/BLUE SHIELD | Admitting: Family Medicine

## 2016-01-05 VITALS — BP 115/75 | HR 77 | Wt 146.0 lb

## 2016-01-05 DIAGNOSIS — E038 Other specified hypothyroidism: Secondary | ICD-10-CM | POA: Diagnosis not present

## 2016-01-05 DIAGNOSIS — I10 Essential (primary) hypertension: Secondary | ICD-10-CM

## 2016-01-05 DIAGNOSIS — F5101 Primary insomnia: Secondary | ICD-10-CM | POA: Diagnosis not present

## 2016-01-05 MED ORDER — ALPRAZOLAM 0.25 MG PO TABS: 0.2500 mg | ORAL_TABLET | Freq: Every evening | ORAL | 3 refills | Status: DC | PRN

## 2016-01-05 NOTE — Progress Notes (Signed)
Subjective:    CC:   HPI:  Hypothyroid - She is currently on Synthroid 50 g daily. Brand only. He has had some palms with feeling her heartbeat in her ears and wonders if that could be from her thyroid.  Insomnia - currently using alprazolam 0.25 mg as needed for sleep.  Hypertension- Pt denies chest pain, SOB, dizziness, or heart palpitations.  Taking meds as directed w/o problems.  Denies medication side effects.      Past medical history, Surgical history, Family history not pertinant except as noted below, Social history, Allergies, and medications have been entered into the medical record, reviewed, and corrections made.   Review of Systems: No fevers, chills, night sweats, weight loss, chest pain, or shortness of breath.   Objective:    General: Well Developed, well nourished, and in no acute distress.  Neuro: Alert and oriented x3, extra-ocular muscles intact, sensation grossly intact.  HEENT: Normocephalic, atraumatic  Skin: Warm and dry, no rashes. Cardiac: Regular rate and rhythm, no murmurs rubs or gallops, no lower extremity edema.  Respiratory: Clear to auscultation bilaterally. Not using accessory muscles, speaking in full sentences.   Impression and Recommendations:   Hypo-thyroidism-due to recheck levels.  Insomnia- well controlled. Refilled medication today. Follow-up in 6 months.  HTN - Well controlled. Continue current regimen. Follow up in  6 months.

## 2016-01-06 LAB — TSH: TSH: 0.84 m[IU]/L

## 2016-01-07 ENCOUNTER — Telehealth: Payer: Self-pay

## 2016-01-07 DIAGNOSIS — N926 Irregular menstruation, unspecified: Secondary | ICD-10-CM

## 2016-01-07 NOTE — Telephone Encounter (Signed)
Pt reports that she hasn't had a period in the last 2 years. She is wanting to know is it safe to have unprotected sex without worrying about getting pregnant. Please advise.

## 2016-01-09 NOTE — Telephone Encounter (Signed)
She is probably safe that the only way to make sure would be to check her hormones including FSH, LH,  estradiol and progesterone just to make sure that it looks consistent with being in menopause. we'll be happy to perform the work if she would like. And she can also try to use condoms.

## 2016-01-09 NOTE — Telephone Encounter (Signed)
Pt advised of recommendation. Labs were done early this year but Pt is OK to get them completed again. Will place orders. Pt advised we will contact her once results are in. Advised to use protection until we give her a call regarding results.

## 2016-01-10 LAB — FOLLICLE STIMULATING HORMONE: FSH: 149.8 m[IU]/mL — AB

## 2016-01-10 LAB — PROGESTERONE: Progesterone: <0.5 ng/mL

## 2016-01-10 LAB — LUTEINIZING HORMONE: LH: 91.3 m[IU]/mL — AB

## 2016-01-19 ENCOUNTER — Telehealth: Payer: Self-pay

## 2016-01-19 ENCOUNTER — Ambulatory Visit: Payer: BLUE CROSS/BLUE SHIELD | Admitting: Family Medicine

## 2016-01-19 DIAGNOSIS — M545 Low back pain, unspecified: Secondary | ICD-10-CM

## 2016-01-19 DIAGNOSIS — M79605 Pain in left leg: Principal | ICD-10-CM

## 2016-01-19 NOTE — Telephone Encounter (Signed)
Epidural ordered.  

## 2016-01-19 NOTE — Telephone Encounter (Signed)
Message left with [facility] to schedule pt.

## 2016-01-20 ENCOUNTER — Ambulatory Visit (INDEPENDENT_AMBULATORY_CARE_PROVIDER_SITE_OTHER): Payer: BLUE CROSS/BLUE SHIELD | Admitting: Family Medicine

## 2016-01-20 ENCOUNTER — Encounter: Payer: Self-pay | Admitting: Family Medicine

## 2016-01-20 VITALS — BP 128/85 | HR 72 | Wt 149.0 lb

## 2016-01-20 DIAGNOSIS — M545 Low back pain, unspecified: Secondary | ICD-10-CM

## 2016-01-20 DIAGNOSIS — M79605 Pain in left leg: Principal | ICD-10-CM

## 2016-01-20 LAB — ESTRADIOL, FREE
ESTRADIOL: 10 pg/mL
Estradiol, Free: 0.14 pg/mL

## 2016-01-20 MED ORDER — GABAPENTIN 300 MG PO CAPS: ORAL_CAPSULE | ORAL | 3 refills | Status: DC

## 2016-01-20 NOTE — Patient Instructions (Signed)
Thank you for coming in today. Take gabapentin at bedtime.  You can increase to three times daily as needed.  We will consider PT after the injections.  You can continue the tylenol and ibuprofen.   Call or go to the ER if you develop a large red swollen joint with extreme pain or oozing puss.

## 2016-01-20 NOTE — Progress Notes (Signed)
Rachel Mack is a 55 y.o. female who presents to [facility] today for left lumbar radiculopathy. Patient had return of low back pain radiating to the left leg starting a few days ago. She has been scheduled for an epidural steroid injection in 3 days. She notes continued severe pain worse with forward flexion of her lumbar spine. She denies weakness or numbness bowel bladder dysfunction. She's tried ibuprofen and Tylenol which are not sufficient to control her pain. She would like something to help for pain control until she has her epidural steroid injection. The last injection worked for months. She would like to avoid narcotics if possible.   Past Medical History:  Diagnosis Date  . Hypertension   . Hypothyroidism   . Nephrolithiasis    nonobstructive; has never passed one  . Perimenopausal    helped regulate menses  . Vitamin B 12 deficiency    Past Surgical History:  Procedure Laterality Date  . GYNECOLOGIC CRYOSURGERY  2005 or before   cervicitis (Dr. Lissa Hoard in [place])   Social History  Substance Use Topics  . Smoking status: Former Smoker    Quit date: 02/23/2004  . Smokeless tobacco: Never Used  . Alcohol use No     ROS:  As above   Medications: Current Outpatient Prescriptions  Medication Sig Dispense Refill  . ALPRAZolam (XANAX) 0.25 MG tablet Take 1 tablet (0.25 mg total) by mouth at bedtime as needed. for sleep.  NEED FOLLOW UP VISIT FOR MORE REFILLS 30 tablet 3  . cyanocobalamin (,VITAMIN B-12,) 1000 MCG/ML injection INJECT 1ML (1000MCG TOTAL) INTO THE MUSCLE ONCE AS    DIRECTED DISCARD 28 DAYS   AFTER  FIRST USE 3 mL 2  . fluticasone (FLONASE) 50 MCG/ACT nasal spray One spray in each nostril twice a day, use left hand for right nostril, and right hand for left nostril. 48 g 3  . hydrochlorothiazide (MICROZIDE) 12.5 MG capsule TAKE 1 CAPSULE EVERY       MORNING 90 capsule 2  . MISC NATURAL PRODUCTS PO Take by  mouth. Estrogen Balance    . Nutritional Supplements (MENOPAUSE FORMULA PO) Take by mouth. Menocare    . SYNTHROID 50 MCG tablet TAKE 1 TABLET (50 MCG TOTAL) BY MOUTH DAILY. BRAND MEDICALLY NECCESSARY 90 tablet 1  . UNABLE TO FIND Med Name: Menocare    . gabapentin (NEURONTIN) 300 MG capsule One tab PO qHS for a week, then BID for a week, then TID. May double weekly to a max of 3,600mg /day 180 capsule 3   No current facility-administered medications for this visit.    Allergies  Allergen Reactions  . Amoxicillin     REACTION: yeast infection  . Bupropion Hcl     REACTION: heart palpitations  . Sulfa Antibiotics      Exam:  BP 128/85   Pulse 72   Wt 149 lb (67.6 kg)   BMI 25.18 kg/m  General: Well Developed, well nourished, and in no acute distress.  Neuro/Psych: Alert and oriented x3, extra-ocular muscles intact, able to move all 4 extremities, sensation grossly intact. Skin: Warm and dry, no rashes noted.  Respiratory: Not using accessory muscles, speaking in full sentences, trachea midline.  Cardiovascular: Pulses palpable, no extremity edema. Abdomen: Does not appear distended. MSK: Spine nontender normal back motion pain with flexion. Positive straight leg raise test. Lower extremity strength is equal and normal throughout. Sensation is intact distally.    No results found  for this or any previous visit (from the past 48 hour(s)). No results found.    Assessment and Plan: 55 y.o. female with lumbar radiculopathy. Patient already has an established appointment for epidural steroid injection in 3 days. In the meantime we'll use gabapentin to help control pain. Additionally we'll refer to physical therapy as symptoms have returned in the past.  Return as needed  This is an established problem that has worsened.   Orders Placed This Encounter  Procedures  . Ambulatory referral to Physical Therapy    Referral Priority:   Routine    Referral Type:   Physical Medicine      Referral Reason:   Specialty Services Required    Requested Specialty:   Physical Therapy    Number of Visits Requested:   1    Discussed warning signs or symptoms. Please see discharge instructions. Patient expresses understanding.

## 2016-01-23 ENCOUNTER — Ambulatory Visit
Admission: RE | Admit: 2016-01-23 | Discharge: 2016-01-23 | Disposition: A | Discharge status: Home / Self Care | Payer: BLUE CROSS/BLUE SHIELD | Source: Ambulatory Visit | Attending: Family Medicine | Admitting: Family Medicine

## 2016-01-23 MED ORDER — METHYLPREDNISOLONE ACETATE 40 MG/ML INJ SUSP (RADIOLOG
120.0000 mg | Freq: Once | INTRAMUSCULAR | Status: AC
  Administered 2016-01-23: 120 mg via EPIDURAL

## 2016-01-23 MED ORDER — IOPAMIDOL (ISOVUE-M 200) INJECTION 41%
1.0000 mL | Freq: Once | INTRAMUSCULAR | Status: AC
  Administered 2016-01-23: 1 mL via EPIDURAL

## 2016-01-23 NOTE — Discharge Instructions (Signed)

## 2016-01-28 ENCOUNTER — Ambulatory Visit (INDEPENDENT_AMBULATORY_CARE_PROVIDER_SITE_OTHER): Payer: BLUE CROSS/BLUE SHIELD | Admitting: Rehabilitative and Restorative Service Providers"

## 2016-01-28 ENCOUNTER — Encounter: Payer: Self-pay | Admitting: Rehabilitative and Restorative Service Providers"

## 2016-01-28 ENCOUNTER — Ambulatory Visit: Payer: BLUE CROSS/BLUE SHIELD | Admitting: Rehabilitative and Restorative Service Providers"

## 2016-01-28 DIAGNOSIS — R293 Abnormal posture: Secondary | ICD-10-CM | POA: Diagnosis not present

## 2016-01-28 DIAGNOSIS — M5416 Radiculopathy, lumbar region: Secondary | ICD-10-CM | POA: Diagnosis not present

## 2016-01-28 DIAGNOSIS — M6281 Muscle weakness (generalized): Secondary | ICD-10-CM | POA: Diagnosis not present

## 2016-01-28 DIAGNOSIS — R29898 Other symptoms and signs involving the musculoskeletal system: Secondary | ICD-10-CM | POA: Diagnosis not present

## 2016-01-28 NOTE — Therapy (Signed)
[facility] [place], [place], [address] Phone: [phone]   Fax:  [phone]  Physical Therapy Evaluation  Patient Details  Name: Rachel Mack MRN: [id number] Date of Birth: 01-04-1961 Referring Provider: Dr Lynne Leader   Encounter Date: 01/28/2016      PT End of Session - 01/28/16 1126    Visit Number 1   Number of Visits 12   Date for PT Re-Evaluation 03/10/16   PT Start Time 1020   PT Stop Time 1123   PT Time Calculation (min) 63 min   Activity Tolerance Patient tolerated treatment well      Past Medical History:  Diagnosis Date  . Hypertension   . Hypothyroidism   . Nephrolithiasis    nonobstructive; has never passed one  . Perimenopausal    helped regulate menses  . Vitamin B 12 deficiency     Past Surgical History:  Procedure Laterality Date  . GYNECOLOGIC CRYOSURGERY  2005 or before   cervicitis (Dr. Lissa Hoard in [place])    There were no vitals filed for this visit.       Subjective Assessment - 01/28/16 1023    Subjective Patient reports that she has been having LBP and Lt LE pain for at least the past 6 months but has had recurrent back and Lt LE problems over the 16-18 months with no known injury. She denies history of LBP. She has had 3 epidural injections most recently 01/23/16 with temporary improvement.    Pertinent History HTN; kidney stones; fracture of one foot 2-4 yrs ago can't remember which foot    How long can you sit comfortably? 20-30 min    How long can you stand comfortably? 20-30 min    How long can you walk comfortably? walks OK has Lt LE pai nwhen she stops walking    Diagnostic tests xray; MRI showed 3 herniated discs in the lumbar spine    Patient Stated Goals get rid of the pain in the back and leg    Currently in Pain? Yes   Pain Score 1    Pain Location Leg   Pain Orientation Left   Pain Descriptors / Indicators Throbbing   Pain Type Chronic pain    Pain Radiating Towards Lt shin are - at times pain in Lt buttock to lateral thigh to anterior leg    Pain Onset More than a month ago   Pain Frequency Intermittent   Aggravating Factors  activities at home and work unable to identify specific triggers    Pain Relieving Factors injections; trigger point release work; pressure; rubbing; heat/cold - some temporary help            [facility] PT Assessment - 01/28/16 0001      Assessment   Medical Diagnosis Lt lumbar radiculopathy   Referring Provider Dr Lynne Leader    Onset Date/Surgical Date 03/09/15   Hand Dominance Right   Next MD Visit PRN    Prior Therapy here - 2 visits      Precautions   Precautions None     Balance Screen   Has the patient fallen in the past 6 months No   Has the patient had a decrease in activity level because of a fear of falling?  No   Is the patient reluctant to leave their home because of a fear of falling?  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 years    Leisure household chores; loves yard work and is used to doing yard work       Observation/Other Assessments   Focus on Therapeutic Outcomes (FOTO)  52% limitation      Sensation   Additional Comments intermittent into the Lt foot      Posture/Postural Control   Posture Comments head forward; shoudlers rounded and elevated; increased thoracic kyphosis; equal wt bearing bilat LE's      AROM   Overall AROM Comments End range tightness Lt hip   Lumbar Flexion 35%   Lumbar Extension 30%   Lumbar - Right Side Bend 35%   Lumbar - Left Side Bend 30%   Lumbar - Right Rotation 20%   Lumbar - Left Rotation 15%     Strength   Overall Strength Comments 5/5 bilat LE except Lt hip ext 5-/5     Flexibility   Hamstrings Rt 75 deg; Lt 60 deg    Quadriceps tight Rt 100 deg; Lt 110 deg    ITB tight Rt > Lt    Piriformis mild tightness Lt > Rt      Palpation   Spinal mobility hypomobility CPA L1 - L5; Rt UPA L3/4; Lt UPA L4/5    SI assessment  WFL's   Palpation comment tightness Lt QL/lumbar paraspinals/glutes/piriformis; tight Lt psoas/hip flexors     Special Tests    Special Tests --  Lt LE (+)Fabers; SLR at 60 deg Lt LE radicular symptoms      Transfers   Comments moves with guarded mvt patterns; difficulty with transitions      Ambulation/Gait   Gait Comments antalgic gait with decreased WB Lt LE slight limp Lt LE                    [facility] Adult PT Treatment/Exercise - 01/28/16 0001      Lumbar Exercises: Stretches   Passive Hamstring Stretch 3 reps;30 seconds   Press Ups --  2-3 sec x 10    Piriformis Stretch 3 reps;60 seconds     Lumbar Exercises: Standing   Other Standing Lumbar Exercises standing trunk ext 2-3 sec x 3      Lumbar Exercises: Supine   AB Set Limitations 3 part core 10 sec x 10      Moist Heat Therapy   Number Minutes Moist Heat 20 Minutes   Moist Heat Location Lumbar Spine;Hip     Electrical Stimulation   Electrical Stimulation Location Lt lumbar to Lt buttock   Electrical Stimulation Action IFC   Electrical Stimulation Parameters to tolerance   Electrical Stimulation Goals Pain;Tone                PT Education - 01/28/16 1106    Education provided Yes   Education Details HEP; Geophysicist/field seismologist) Educated Patient   Methods Explanation;Demonstration;Tactile cues;Verbal cues;Handout   Comprehension Verbalized understanding;Returned demonstration;Verbal cues required;Tactile cues required             PT Long Term Goals - 01/28/16 1133      PT LONG TERM GOAL #1   Title Improve sitting; standing tolerance to 1 hours 03/12/15   Time 6   Period Weeks   Status New     PT LONG TERM GOAL #2   Title 5/5 bilat hip ext and Improve spinal mobility/hamstring flexibility to [facility] 03/11/16  Time 6   Period Weeks   Status New     PT LONG TERM GOAL #3    Title Decrease Lt LE pain and radicular symptoms 75-80% per pt report 03/11/16   Time 6   Period Weeks   Status New     PT LONG TERM GOAL #4   Title I in HEP 03/11/16   Time 6   Period Weeks   Status New     PT LONG TERM GOAL #5   Title Improve FOTO to </= 40% limitation 05/15/15   Time 6   Period Weeks   Status New               Plan - 01/28/16 1127    Clinical Impression Statement Patient presents with signs and sympotoms consistent with Lt lumbar radiculopathy. She has a history of LBP/Lt LE pain for ~ the past year and a half with no known injuries. 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 Patient has limited trunk ORM; (+) SLR/Fabers tests Lt; pain with palpation through the lumbar and Lt hip area; radicular pain Lt LE; limited functional activity level.    Rehab Potential Good   PT Frequency 2x / week   PT Duration 6 weeks   PT Treatment/Interventions Patient/family education;ADLs/Self Care Home Management;Neuromuscular re-education;Cryotherapy;Electrical Stimulation;Iontophoresis 4mg /ml Dexamethasone;Moist Heat;Traction;Ultrasound;Dry needling;Manual techniques;Therapeutic activities;Therapeutic exercise   PT Next Visit Plan progress with core stabilization; stretching and ROM; back education; trial of TDN to Lt QL/lumbar/buttock; manual work; modalities as indicated.    Consulted and Agree with Plan of Care Patient      Patient will benefit from skilled therapeutic intervention in order to improve the following deficits and impairments:  Postural dysfunction, Improper body mechanics, Pain, Increased muscle spasms, Increased fascial restricitons, Decreased mobility, Decreased activity tolerance  Visit Diagnosis: Lumbar radiculopathy - Plan: PT plan of care cert/re-cert  Other symptoms and signs involving the musculoskeletal system - Plan: PT plan of care cert/re-cert  Muscle  weakness (generalized) - Plan: PT plan of care cert/re-cert  Abnormal posture - Plan: PT plan of care cert/re-cert     Problem List Patient Active Problem List   Diagnosis Date Noted  . Eustachian tube dysfunction, bilateral 11/27/2015  . Osteopenia 04/18/2015  . Lumbar pain with radiation down left leg 03/03/2015  . Trochanteric bursitis of left hip 03/03/2015  . Inverted nipple 02/06/2013  . Vitamin D deficiency 01/27/2012  . Insomnia 11/01/2011  . Pericardial effusion 01/08/2011  . Essential hypertension, benign 09/16/2010  . FOOT PAIN, RIGHT 01/19/2010  . B12 DEFICIENCY 12/26/2008  . IRREGULAR MENSES 07/31/2008  . Hypothyroidism 04/25/2008  . DEPRESSION, MILD 04/25/2008  . PERIMENOPAUSAL STATUS 04/25/2008    Rachel Mack Nilda Simmer PT, MPH  01/28/2016, 11:47 AM  [facility] [place], [place], [address] Phone: [phone]   Fax:  [phone]  Name: Rachel Mack MRN: [id number] Date of Birth: 08-10-1960

## 2016-01-28 NOTE — Patient Instructions (Signed)
Avoid standing with knees locked!!  Trunk: Prone Extension (Press-Ups)    Lie on stomach on firm, flat surface. Relax bottom and legs. Raise chest in air with elbows straight. Keep hips flat on surface, sag stomach. Hold ___2-3_ seconds. Repeat _10___ times. Do __3-4__ sessions per day. CAUTION: Movement should be gentle and slow.  Trunk Extension    Standing, place back of open hands on low back. Straighten spine then arch the back and move shoulders back. Repeat __2-3__ times per session. Do __several__ times per day  Abdominal Bracing With Pelvic Floor (Hook-Lying)    With neutral spine, tighten pelvic floor and abdominals sucking; tighten muscles in back at waist.  Hold 10 sec Repeat _10__ times. Do __several_ times a day. Progress to do this with sitting; standing; walking; and working   Piriformis Stretch    Lying on back, pull right knee toward opposite shoulder. Hold __30__ seconds. Repeat ___3_ times. Do _3___ sessions per day. HIP: Hamstrings - Supine    Place strap around foot. Raise leg up, keep knee straight. Hold __30_ seconds. _3__ reps per set, 3_ sets per day     Sleeping on Back  Place pillow under knees. A pillow with cervical support and a roll around waist are also helpful. Copyright  VHI. All rights reserved.  Sleeping on Side Place pillow between knees. Use cervical support under neck and a roll around waist as needed. Copyright  VHI. All rights reserved.   Sleeping on Stomach   If this is the only desirable sleeping position, place pillow under lower legs, and under stomach or chest as needed. Posture - Sitting   Sit upright, head facing forward. Try using a roll to support lower back. Keep shoulders relaxed, and avoid rounded back. Keep hips level with knees. Avoid crossing legs for long periods.  Stand to Sit / Sit to Stand   To sit: Bend knees to lower self onto front edge of chair, then scoot back on seat. To stand: Reverse  sequence by placing one foot forward, and scoot to front of seat. Use rocking motion to stand up.  Work Height and Reach  Ideal work height is no more than 2 to 4 inches below elbow level when standing, and at elbow level when sitting. Reaching should be limited to arm's length, with elbows slightly bent.  Bending   Bend at hips and knees, not back. Keep feet shoulder-width apart.  Posture - Standing   Good posture is important. Avoid slouching and forward head thrust. Maintain curve in low back and align ears over shoul- ders, hips over ankles.  Alternating Positions   Alternate tasks and change positions frequently to reduce fatigue and muscle tension. Take rest breaks. Computer Work   Position work to Programmer, multimedia. Use proper work and seat height. Keep shoulders back and down, wrists straight, and elbows at right angles. Use chair that provides full back support. Add footrest and lumbar roll as needed.   Getting Into / Out of Car  Lower self onto seat, scoot back, then bring in one leg at a time. Reverse sequence to get out.  Dressing   Lie on back to pull socks or slacks over feet, or sit and bend leg while keeping back straight.   Housework - Sink  Place one foot on ledge of cabinet under sink when standing at sink for prolonged periods.  Pushing / Pulling   Pushing is preferable to pulling. Keep back in proper alignment, and use leg  muscles to do the work.  Deep Squat   Squat and lift with both arms held against upper trunk. Tighten stomach muscles without holding breath. Use smooth movements to avoid jerking. Avoid Twisting   Avoid twisting or bending back. Pivot around using foot movements, and bend at knees if needed when reaching for articles. Carrying Luggage   Distribute weight evenly on both sides. Use a cart whenever possible. Do not twist trunk. Move body as a unit.  Lifting Principles .Maintain proper posture and head alignment. .Slide object as close as  possible before lifting. .Move obstacles out of the way. .Test before lifting; ask for help if too heavy. .Tighten stomach muscles without holding breath. .Use smooth movements; do not jerk. .Use legs to do the work, and pivot with feet. .Distribute the work load symmetrically and close to the center of trunk. .Push instead of pull whenever possible.  Ask For Help   Ask for help and delegate to others when possible. Coordinate your movements when lifting together, and maintain the low back curve. Log Roll   Lying on back, bend left knee and place left arm across chest. Roll all in one movement to the right. Reverse to roll to the left. Always move as one unit. Housework - Sweeping   Use long-handled equipment to avoid stooping.  Housework - Wiping  Position yourself as close as possible to reach work surface. Avoid straining your back. Laundry - Unloading Wash   To unload small items at bottom of washer, lift leg opposite to arm being used to reach. [place] close to area to be raked. Use arm movements to do the work. Keep back straight and avoid twisting.  Cart  When reaching into cart with one arm, lift opposite leg to keep back straight.  Getting Into / Out of Bed  Lower self to lie down on one side by raising legs and lowering head at the same time. Use arms to assist moving without twisting. Bend both knees to roll onto back if desired. To sit up, start from lying on side, and use same move-ments in reverse.  Housework - Vacuuming  Hold the vacuum with arm held at side. Step back and forth to move it, keeping head up. Avoid twisting. Laundry - IT consultant so that bending and twisting can be avoided.  Laundry - Unloading Dryer  Squat down to reach into clothes dryer or use a reacher. Gardening - Weeding / Probation officer or Kneel. Knee pads may be helpful.

## 2016-01-29 ENCOUNTER — Ambulatory Visit: Payer: BLUE CROSS/BLUE SHIELD | Admitting: Physical Therapy

## 2016-02-02 ENCOUNTER — Ambulatory Visit (INDEPENDENT_AMBULATORY_CARE_PROVIDER_SITE_OTHER): Payer: BLUE CROSS/BLUE SHIELD | Admitting: Rehabilitative and Restorative Service Providers"

## 2016-02-02 ENCOUNTER — Ambulatory Visit: Payer: BLUE CROSS/BLUE SHIELD | Admitting: Family Medicine

## 2016-02-02 ENCOUNTER — Encounter: Payer: Self-pay | Admitting: Rehabilitative and Restorative Service Providers"

## 2016-02-02 DIAGNOSIS — M545 Low back pain, unspecified: Secondary | ICD-10-CM

## 2016-02-02 DIAGNOSIS — M79605 Pain in left leg: Secondary | ICD-10-CM | POA: Diagnosis not present

## 2016-02-02 NOTE — Therapy (Signed)
[place], [place], [address] Phone: [phone]   Fax:  [phone]  Physical Therapy Treatment  Patient Details  Name: Rachel Mack MRN: [id number] Date of Birth: 01/27/1961 Referring Provider: Dr Lynne Leader   Encounter Date: 02/02/2016      PT End of Session - 02/02/16 1019    Visit Number 2   Number of Visits 12   Date for PT Re-Evaluation 03/10/16   PT Start Time [id number]   PT Stop Time 1112   PT Time Calculation (min) 57 min   Activity Tolerance Patient tolerated treatment well      Past Medical History:  Diagnosis Date  . Hypertension   . Hypothyroidism   . Nephrolithiasis    nonobstructive; has never passed one  . Perimenopausal    helped regulate menses  . Vitamin B 12 deficiency     Past Surgical History:  Procedure Laterality Date  . GYNECOLOGIC CRYOSURGERY  2005 or before   cervicitis (Dr. Lissa Hoard in [place])    There were no vitals filed for this visit.      Subjective Assessment - 02/02/16 1020    Subjective patient reports little to no change. She has been doing her exercises at home. Ready to try the TDN.    Currently in Pain? Yes   Pain Score 3    Pain Location Back   Pain Orientation Left   Pain Descriptors / Indicators Throbbing   Pain Radiating Towards piercing pain into the Lt shin - at times into the Lt buttock and anterior thigh   Pain Onset More than a month ago   Pain Frequency Intermittent                         [facility] Adult PT Treatment/Exercise - 02/02/16 0001      Lumbar Exercises: Stretches   Passive Hamstring Stretch 3 reps;30 seconds   Press Ups --  2-3 sec x 10    Piriformis Stretch 3 reps;60 seconds     Lumbar Exercises: Aerobic   Stationary Bike Nustep L5 x 5 min      Lumbar Exercises: Supine   AB Set Limitations 3 part core 10 sec x 10    Clam 10 reps  with core engaged   Bent Knee Raise 10 reps  with  core engaged      Moist Heat Therapy   Number Minutes Moist Heat 20 Minutes   Moist Heat Location Lumbar Spine;Hip     Electrical Stimulation   Electrical Stimulation Location Lt lumbar to Lt buttock   Electrical Stimulation Action IFC   Electrical Stimulation Parameters to tolerance   Electrical Stimulation Goals Pain;Tone     Manual Therapy   Manual therapy comments pt prone   Soft tissue mobilization working through the lumbar paraspinals; QL; piriformis; hip abductors Lt    Myofascial Release Lt lumbar to hip area           Trigger Point Dry Needling - 02/02/16 1106    Longissimus Response Palpable increased muscle length;Twitch response elicited  Lt lumbar    Gluteus Maximus Response Palpable increased muscle length   Gluteus Minimus Response Palpable increased muscle length   Piriformis Response Palpable increased muscle length              PT Education - 02/02/16 1100    Education provided Yes   Education Details HEP TDN   Person(s)  Educated Patient   Methods Explanation;Demonstration;Tactile cues;Verbal cues;Handout   Comprehension Verbalized understanding;Returned demonstration;Verbal cues required;Tactile cues required             PT Long Term Goals - 02/02/16 1021      PT LONG TERM GOAL #1   Title Improve sitting; standing tolerance to 1 hours 03/12/15   Time 6   Period Weeks   Status On-going     PT LONG TERM GOAL #2   Title 5/5 bilat hip ext and Improve spinal mobility/hamstring flexibility to [facility] 03/11/16   Time 6   Period Weeks   Status On-going     PT LONG TERM GOAL #3   Title Decrease Lt LE pain and radicular symptoms 75-80% per pt report 03/11/16   Time 6   Period Weeks   Status On-going     PT LONG TERM GOAL #4   Title I in HEP 03/11/16   Time 6   Period Weeks   Status On-going     PT LONG TERM GOAL #5   Title Improve FOTO to </= 40% limitation 05/15/15   Time 6   Period Weeks   Status On-going                Plan - 02/02/16 1100    Clinical Impression Statement Tolerated TDN well with good release of muscular tightness noted through the piriformis/hip abductors expecially as well as the lumbar musculature. Added core stabilization without difficulty with focus on three part core. No goals accomplished, only third visit.    Rehab Potential Good   PT Frequency 2x / week   PT Duration 6 weeks   PT Treatment/Interventions Patient/family education;ADLs/Self Care Home Management;Neuromuscular re-education;Cryotherapy;Electrical Stimulation;Iontophoresis 4mg /ml Dexamethasone;Moist Heat;Traction;Ultrasound;Dry needling;Manual techniques;Therapeutic activities;Therapeutic exercise   PT Next Visit Plan progress with core stabilization; stretching and ROM; back education; assess response TDN to Lt QL/lumbar/buttock; manual work; modalities as indicated.    Consulted and Agree with Plan of Care Patient      Patient will benefit from skilled therapeutic intervention in order to improve the following deficits and impairments:  Postural dysfunction, Improper body mechanics, Pain, Increased muscle spasms, Increased fascial restricitons, Decreased mobility, Decreased activity tolerance  Visit Diagnosis: Pain of left lower extremity  Lumbar pain with radiation down left leg     Problem List Patient Active Problem List   Diagnosis Date Noted  . Eustachian tube dysfunction, bilateral 11/27/2015  . Osteopenia 04/18/2015  . Lumbar pain with radiation down left leg 03/03/2015  . Trochanteric bursitis of left hip 03/03/2015  . Inverted nipple 02/06/2013  . Vitamin D deficiency 01/27/2012  . Insomnia 11/01/2011  . Pericardial effusion 01/08/2011  . Essential hypertension, benign 09/16/2010  . FOOT PAIN, RIGHT 01/19/2010  . B12 DEFICIENCY 12/26/2008  . IRREGULAR MENSES 07/31/2008  . Hypothyroidism 04/25/2008  . DEPRESSION, MILD 04/25/2008  . PERIMENOPAUSAL STATUS 04/25/2008    Celyn Nilda Simmer PT,  MPH 02/02/2016, 11:52 AM  [facility] [place], [place], [address] Phone: [phone]   Fax:  [phone]  Name: Rachel Mack MRN: [id number] Date of Birth: July 27, 1960

## 2016-02-02 NOTE — Patient Instructions (Addendum)
Bent Leg Lift (Hook-Lying)    Tighten core and slowly raise right leg ____ inches from floor. Keep trunk rigid. Hold _2-3___ seconds. Repeat __10__ times per set. Do __1-3__ sets per session. Do _1___ sessions per day.   (Hook-Lying)    Tighten core and slowly drop one knee out to the side then return to upright. Repeat with opposite knee. Repeat __10__ times each leg per set. Do _1-3___ sets per session. Do __1__ sessions per day.   Trigger Point Dry Needling  . What is Trigger Point Dry Needling (DN)? o DN is a physical therapy technique used to treat muscle pain and dysfunction. Specifically, DN helps deactivate muscle trigger points (muscle knots).  o A thin filiform needle is used to penetrate the skin and stimulate the underlying trigger point. The goal is for a local twitch response (LTR) to occur and for the trigger point to relax. No medication of any kind is injected during the procedure.   . What Does Trigger Point Dry Needling Feel Like?  o The procedure feels different for each individual patient. Some patients report that they do not actually feel the needle enter the skin and overall the process is not painful. Very mild bleeding may occur. However, many patients feel a deep cramping in the muscle in which the needle was inserted. This is the local twitch response.   Marland Kitchen How Will I feel after the treatment? o Soreness is normal, and the onset of soreness may not occur for a few hours. Typically this soreness does not last longer than two days.  o Bruising is uncommon, however; ice can be used to decrease any possible bruising.  o In rare cases feeling tired or nauseous after the treatment is normal. In addition, your symptoms may get worse before they get better, this period will typically not last longer than 24 hours.   . What Can I do After My Treatment? o Increase your hydration by drinking more water for the next 24 hours. o You may place ice or heat on the areas  treated that have become sore, however, do not use heat on inflamed or bruised areas. Heat often brings more relief post needling. o You can continue your regular activities, but vigorous activity is not recommended initially after the treatment for 24 hours. o DN is best combined with other physical therapy such as strengthening, stretching, and other therapies.

## 2016-02-04 ENCOUNTER — Encounter: Payer: Self-pay | Admitting: Physical Therapy

## 2016-02-04 ENCOUNTER — Ambulatory Visit (INDEPENDENT_AMBULATORY_CARE_PROVIDER_SITE_OTHER): Payer: BLUE CROSS/BLUE SHIELD | Admitting: Physical Therapy

## 2016-02-04 DIAGNOSIS — M2560 Stiffness of unspecified joint, not elsewhere classified: Secondary | ICD-10-CM

## 2016-02-04 DIAGNOSIS — R293 Abnormal posture: Secondary | ICD-10-CM

## 2016-02-04 DIAGNOSIS — Z7409 Other reduced mobility: Secondary | ICD-10-CM

## 2016-02-04 DIAGNOSIS — R29898 Other symptoms and signs involving the musculoskeletal system: Secondary | ICD-10-CM

## 2016-02-04 DIAGNOSIS — M545 Low back pain, unspecified: Secondary | ICD-10-CM

## 2016-02-04 DIAGNOSIS — R531 Weakness: Secondary | ICD-10-CM

## 2016-02-04 DIAGNOSIS — R6889 Other general symptoms and signs: Secondary | ICD-10-CM

## 2016-02-04 DIAGNOSIS — M5416 Radiculopathy, lumbar region: Secondary | ICD-10-CM

## 2016-02-04 DIAGNOSIS — M7918 Myalgia, other site: Secondary | ICD-10-CM

## 2016-02-04 DIAGNOSIS — M6281 Muscle weakness (generalized): Secondary | ICD-10-CM

## 2016-02-04 DIAGNOSIS — M79605 Pain in left leg: Secondary | ICD-10-CM

## 2016-02-04 NOTE — Therapy (Signed)
[place], [place], [address] Phone: [phone]   Fax:  [phone]  Physical Therapy Treatment  Patient Details  Name: Rachel Mack MRN: [id number] Date of Birth: 1960/11/07 Referring Provider: Dr Lynne Leader   Encounter Date: 02/04/2016      PT End of Session - 02/04/16 1406    Visit Number 3   Number of Visits 12   Date for PT Re-Evaluation 03/10/16   PT Start Time 1406   PT Stop Time 1454   PT Time Calculation (min) 48 min      Past Medical History:  Diagnosis Date  . Hypertension   . Hypothyroidism   . Nephrolithiasis    nonobstructive; has never passed one  . Perimenopausal    helped regulate menses  . Vitamin B 12 deficiency     Past Surgical History:  Procedure Laterality Date  . GYNECOLOGIC CRYOSURGERY  2005 or before   cervicitis (Dr. Lissa Hoard in [place])    There were no vitals filed for this visit.      Subjective Assessment - 02/04/16 1407    Subjective Pt reports she did ok after the TDN, felt it helped. She wishes to hold off on the TDN until next week. She has noticed some foot drop in her Lt foot.    Patient Stated Goals get rid of the pain in the back and leg    Currently in Pain? No/denies  numbness in in Lt inner thigh and to foot                         [facility] Adult PT Treatment/Exercise - 02/04/16 0001      Lumbar Exercises: Stretches   Prone on Elbows Stretch 60 seconds   Press Ups --  10x 3sec     Lumbar Exercises: Aerobic   Stationary Bike Nustep L5 x 5 min      Lumbar Exercises: Supine   Other Supine Lumbar Exercises quad sets x 10      Lumbar Exercises: Prone   Other Prone Lumbar Exercises pelvic press x10 then with bilat knee flex/ext x 10     Modalities   Modalities Traction     Moist Heat Therapy   Number Minutes Moist Heat --     Traction   Type of Traction Lumbar  wt ~ 145#   Min (lbs) 25   Max (lbs) 45    Hold Time 60   Rest Time 20   Time 15     Manual Therapy   Manual Therapy Soft tissue mobilization;Joint mobilization   Manual therapy comments pt prone   Joint Mobilization lumbar CPA mobs with good mobility today and no pain,, Lt hip posterior mobsgrade III followed by supine piriformis stretch   Soft tissue mobilization STW/TPR to Lt lumbar paraspinals, piriformis and gluts.  More tightnes in the paraspinals as compared to buttocks                     PT Long Term Goals - 02/02/16 1021      PT LONG TERM GOAL #1   Title Improve sitting; standing tolerance to 1 hours 03/12/15   Time 6   Period Weeks   Status On-going     PT LONG TERM GOAL #2   Title 5/5 bilat hip ext and Improve spinal mobility/hamstring flexibility to [facility] 03/11/16   Time 6   Period Weeks  Status On-going     PT LONG TERM GOAL #3   Title Decrease Lt LE pain and radicular symptoms 75-80% per pt report 03/11/16   Time 6   Period Weeks   Status On-going     PT LONG TERM GOAL #4   Title I in HEP 03/11/16   Time 6   Period Weeks   Status On-going     PT LONG TERM GOAL #5   Title Improve FOTO to </= 40% limitation 05/15/15   Time 6   Period Weeks   Status On-going               Plan - 02/04/16 1649    Clinical Impression Statement Rachel Mack had decreased tightness in her buttocks and responded well to manual therapy in her lumbar region.  Tolerated traction well with no increase in symptoms.  Her radicular pain was not present today however she did have numbness in the leg.    Rehab Potential Good   PT Treatment/Interventions Patient/family education;ADLs/Self Care Home Management;Neuromuscular re-education;Cryotherapy;Electrical Stimulation;Iontophoresis 4mg /ml Dexamethasone;Moist Heat;Traction;Ultrasound;Dry needling;Manual techniques;Therapeutic activities;Therapeutic exercise   PT Next Visit Plan assess response to traction, possible TDN again with stim to lumbar paraspinals.  Spinal  stabilzation progression.    Consulted and Agree with Plan of Care Patient      Patient will benefit from skilled therapeutic intervention in order to improve the following deficits and impairments:  Postural dysfunction, Improper body mechanics, Pain, Increased muscle spasms, Increased fascial restricitons, Decreased mobility, Decreased activity tolerance  Visit Diagnosis: Pain of left lower extremity  Lumbar pain with radiation down left leg  Lumbar radiculopathy  Other symptoms and signs involving the musculoskeletal system  Muscle weakness (generalized)  Abnormal posture  Stiffness due to immobility  Myofacial muscle pain  Decreased strength, endurance, and mobility     Problem List Patient Active Problem List   Diagnosis Date Noted  . Eustachian tube dysfunction, bilateral 11/27/2015  . Osteopenia 04/18/2015  . Lumbar pain with radiation down left leg 03/03/2015  . Trochanteric bursitis of left hip 03/03/2015  . Inverted nipple 02/06/2013  . Vitamin D deficiency 01/27/2012  . Insomnia 11/01/2011  . Pericardial effusion 01/08/2011  . Essential hypertension, benign 09/16/2010  . FOOT PAIN, RIGHT 01/19/2010  . B12 DEFICIENCY 12/26/2008  . IRREGULAR MENSES 07/31/2008  . Hypothyroidism 04/25/2008  . DEPRESSION, MILD 04/25/2008  . PERIMENOPAUSAL STATUS 04/25/2008    Jeral Pinch PT  02/04/2016, 4:52 PM  [facility] [place], [place], [address] Phone: [phone]   Fax:  [phone]  Name: Rachel Mack MRN: [id number] Date of Birth: 06-28-1960

## 2016-02-10 ENCOUNTER — Ambulatory Visit (INDEPENDENT_AMBULATORY_CARE_PROVIDER_SITE_OTHER): Payer: BLUE CROSS/BLUE SHIELD | Admitting: Physical Therapy

## 2016-02-10 DIAGNOSIS — M79605 Pain in left leg: Secondary | ICD-10-CM | POA: Diagnosis not present

## 2016-02-10 DIAGNOSIS — M545 Low back pain, unspecified: Secondary | ICD-10-CM

## 2016-02-10 DIAGNOSIS — M5416 Radiculopathy, lumbar region: Secondary | ICD-10-CM | POA: Diagnosis not present

## 2016-02-10 NOTE — Therapy (Signed)
[place], [place], [address] Phone: [phone]   Fax:  [phone]  Physical Therapy Treatment  Patient Details  Name: Rachel Mack MRN: [id number] Date of Birth: 02/20/1961 Referring Provider: Dr. Lynne Leader   Encounter Date: 02/10/2016      PT End of Session - 02/10/16 1156    Visit Number 4   Number of Visits 12   Date for PT Re-Evaluation 03/10/16   PT Start Time 1150   PT Stop Time 1245   PT Time Calculation (min) 55 min   Activity Tolerance Patient tolerated treatment well   Behavior During Therapy [facility] for tasks assessed/performed      Past Medical History:  Diagnosis Date  . Hypertension   . Hypothyroidism   . Nephrolithiasis    nonobstructive; has never passed one  . Perimenopausal    helped regulate menses  . Vitamin B 12 deficiency     Past Surgical History:  Procedure Laterality Date  . GYNECOLOGIC CRYOSURGERY  2005 or before   cervicitis (Dr. Lissa Hoard in [place])    There were no vitals filed for this visit.      Subjective Assessment - 02/10/16 1156    Subjective Pt reports she still has foot drop in Lt foot. She felt relief with traction.  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    Patient Stated Goals get rid of the pain in the back and leg    Currently in Pain? Yes   Pain Score 1    Pain Location Leg   Pain Radiating Towards into Lt shin   Aggravating Factors  bending over    Pain Relieving Factors sitting with support.             [facility] PT Assessment - 02/10/16 0001      Assessment   Medical Diagnosis Lt lumbar radiculopathy   Referring Provider Dr. Lynne Leader    Onset Date/Surgical Date 03/09/15   Hand Dominance Right   Next MD Visit PRN            [facility] Adult PT Treatment/Exercise - 02/10/16 0001      Lumbar Exercises: Stretches   Passive Hamstring Stretch 3 reps;30  seconds  supine with strap   Prone on Elbows Stretch 60 seconds   Press Ups --  10x 3sec     Lumbar Exercises: Aerobic   Stationary Bike Nustep L5 x 6 min      Lumbar Exercises: Standing   Other Standing Lumbar Exercises standing - side to side lunge to simulate lateral slide of pt at work x 3 reps      Lumbar Exercises: Supine   Clam 10 reps  with core engaged     Lumbar Exercises: Prone   Other Prone Lumbar Exercises pelvic press 5 sec hold x 5 reps,  then with unilateral knee flex/ext x 10, repeated with bilat knee flexion/ext x 5 reps, then hip ext (knee straight) x 5 reps each side.       Modalities   Modalities Traction     Electrical Stimulation   Electrical Stimulation Location Lt Lateral leg    Electrical Stimulation Action IFC   Electrical Stimulation Parameters  to pt tolerance     Traction   Type of Traction Lumbar  pt supine   Min (lbs) 25   Max (lbs) 45  unable to tolerate 50#,  reduced to 45#   Hold Time 60   Rest Time 20   Time 15                     PT Long Term Goals - 02/10/16 1210      PT LONG TERM GOAL #1   Title Improve sitting; standing tolerance to 1 hours 03/12/15   Time 6   Period Weeks   Status On-going  able to tolerate 15 min of prolonged standing; 30 min of sitting.      PT LONG TERM GOAL #2   Title 5/5 bilat hip ext and Improve spinal mobility/hamstring flexibility to [facility] 03/11/16   Time 6   Period Weeks   Status On-going     PT LONG TERM GOAL #3   Title Decrease Lt LE pain and radicular symptoms 75-80% per pt report 03/11/16   Time 6   Period Weeks   Status On-going  pt reports 60% reduction of symptoms.      PT LONG TERM GOAL #4   Title I in HEP 03/11/16   Time 6   Period Weeks   Status On-going     PT LONG TERM GOAL #5   Title Improve FOTO to </= 40% limitation 05/15/15   Time 6   Period Weeks   Status On-going               Plan - 02/10/16 1234    Clinical Impression Statement Pt reported  some increased tightness in Lt LE with prone pelvic press series.  Pt unable to tolerate increased pull for lumbar traction; returned to 45#.  Progressing towards goals.    Rehab Potential Good   PT Frequency 2x / week   PT Duration 6 weeks   PT Treatment/Interventions Patient/family education;ADLs/Self Care Home Management;Neuromuscular re-education;Cryotherapy;Electrical Stimulation;Iontophoresis 4mg /ml Dexamethasone;Moist Heat;Traction;Ultrasound;Dry needling;Manual techniques;Therapeutic activities;Therapeutic exercise   PT Next Visit Plan assess response to(supine) traction, possible TDN again with stim to lumbar paraspinals.  Spinal stabilzation progression.    Consulted and Agree with Plan of Care Patient      Patient will benefit from skilled therapeutic intervention in order to improve the following deficits and impairments:  Postural dysfunction, Improper body mechanics, Pain, Increased muscle spasms, Increased fascial restricitons, Decreased mobility, Decreased activity tolerance  Visit Diagnosis: Pain of left lower extremity  Lumbar pain with radiation down left leg  Lumbar radiculopathy     Problem List Patient Active Problem List   Diagnosis Date Noted  . Eustachian tube dysfunction, bilateral 11/27/2015  . Osteopenia 04/18/2015  . Lumbar pain with radiation down left leg 03/03/2015  . Trochanteric bursitis of left hip 03/03/2015  . Inverted nipple 02/06/2013  . Vitamin D deficiency 01/27/2012  . Insomnia 11/01/2011  . Pericardial effusion 01/08/2011  . Essential hypertension, benign 09/16/2010  . FOOT PAIN, RIGHT 01/19/2010  . B12 DEFICIENCY 12/26/2008  . IRREGULAR MENSES 07/31/2008  . Hypothyroidism 04/25/2008  . DEPRESSION, MILD 04/25/2008  . PERIMENOPAUSAL STATUS 04/25/2008   [name], PTA 02/10/16 12:39 PM  [place], [place], [address] Phone: [phone]    Fax:  [phone]  Name: SYRETA LASORSA MRN: [id number] Date of Birth: [birth year]

## 2016-02-13 ENCOUNTER — Ambulatory Visit (INDEPENDENT_AMBULATORY_CARE_PROVIDER_SITE_OTHER): Payer: BLUE CROSS/BLUE SHIELD | Admitting: Rehabilitative and Restorative Service Providers"

## 2016-02-13 ENCOUNTER — Encounter: Payer: Self-pay | Admitting: Rehabilitative and Restorative Service Providers"

## 2016-02-13 DIAGNOSIS — M545 Low back pain, unspecified: Secondary | ICD-10-CM

## 2016-02-13 DIAGNOSIS — R29898 Other symptoms and signs involving the musculoskeletal system: Secondary | ICD-10-CM | POA: Diagnosis not present

## 2016-02-13 DIAGNOSIS — M5416 Radiculopathy, lumbar region: Secondary | ICD-10-CM | POA: Diagnosis not present

## 2016-02-13 DIAGNOSIS — M79605 Pain in left leg: Secondary | ICD-10-CM | POA: Diagnosis not present

## 2016-02-13 DIAGNOSIS — M6281 Muscle weakness (generalized): Secondary | ICD-10-CM

## 2016-02-13 NOTE — Patient Instructions (Addendum)
Combination (Hook-Lying)    Tighten core and slowly raise left leg and lower opposite arm over head. Keep trunk rigid. Repeat _10___ times per set. Do __2-3__ sets per session. Do _2___ sessions per day.    Massage for front of the left hip are with ~4 inch ball

## 2016-02-13 NOTE — Therapy (Signed)
[place], [place], [address] Phone: [phone]   Fax:  [phone]  Physical Therapy Treatment  Patient Details  Name: Rachel Mack MRN: [id number] Date of Birth: 09/10/1960 Referring Provider: Dr. Lynne Leader   Encounter Date: 02/13/2016      PT End of Session - 02/13/16 1143    Visit Number 5   Number of Visits 12   Date for PT Re-Evaluation 03/10/16   PT Start Time 1143   PT Stop Time 1240   PT Time Calculation (min) 57 min      Past Medical History:  Diagnosis Date  . Hypertension   . Hypothyroidism   . Nephrolithiasis    nonobstructive; has never passed one  . Perimenopausal    helped regulate menses  . Vitamin B 12 deficiency     Past Surgical History:  Procedure Laterality Date  . GYNECOLOGIC CRYOSURGERY  2005 or before   cervicitis (Dr. Lissa Hoard in [place])    There were no vitals filed for this visit.      Subjective Assessment - 02/13/16 1145    Subjective Patient reports that she felt different during and after the last traction treatment. Not sure if it was the traction or the new exercises but she "hurt all evening" after the last treatment.    Currently in Pain? Yes   Pain Score 4    Pain Location Leg   Pain Orientation Left   Pain Descriptors / Indicators Throbbing   Pain Type Chronic pain   Pain Onset More than a month ago   Pain Frequency Intermittent                         [facility] Adult PT Treatment/Exercise - 02/13/16 0001      Therapeutic Activites    Therapeutic Activities --  myofacial ball release work iliopsoas standing      Lumbar Exercises: Stretches   Passive Hamstring Stretch 3 reps;30 seconds  supine with strap   Passive Hamstring Stretch Limitations continued limited elevation due to Lt LE pain - (+) SLR   Press Ups --  10x 3sec   Quad Stretch --  hip flexor stretch supine w/PT assist Lt LE off table 30s x3   Quad Stretch Limitations standing hip flexor stretch with PT assist 30 sec x 2 - extending hip flexion of knee      Lumbar Exercises: Aerobic   Stationary Bike Nustep L5 x 6 min      Lumbar Exercises: Supine   AB Set Limitations 3 part core 10 sec x 10    Bent Knee Raise 20 reps  with core engaged    Other Supine Lumbar Exercises alt shd flexion with opposite knee marching(dead bug) core engaged x 10 2 sets      Moist Heat Therapy   Number Minutes Moist Heat 20 Minutes   Moist Heat Location Lumbar Spine;Hip     Electrical Stimulation   Electrical Stimulation Location bilat lumbar L4/S1; Lt piriformis/glut med    Chartered certified accountant IFC   Electrical Stimulation Parameters to tolerance   Electrical Stimulation Goals Pain;Tone     Traction   Type of Traction Lumbar  prone    Min (lbs) 35   Max (lbs) 45   Hold Time 60   Rest Time 20   Time 15     Manual Therapy   Manual therapy comments pt prone  Joint Mobilization lumbar CPA mobs   PA glides Lt hip pt prone working on IR/ER LE pressure GT    Soft tissue mobilization Lt hip into piriformis and gluts - note tightness through origin and insertion of Lt piriformis musculature    Myofascial Release Lt lumbar to hip area                 PT Education - 02/13/16 1243    Education provided Yes   Education Details myofacial ball release Lt anterior pelvis area for iliopsoas; encouraged pt to contact MD if Lt LE weakness persists    Person(s) Educated Patient   Methods Explanation;Demonstration;Tactile cues;Verbal cues;Handout   Comprehension Verbalized understanding;Returned demonstration;Verbal cues required;Tactile cues required             PT Long Term Goals - 02/10/16 1210      PT LONG TERM GOAL #1   Title Improve sitting; standing tolerance to 1 hours 03/12/15   Time 6   Period Weeks   Status On-going  able to tolerate 15 min of prolonged standing; 30 min of sitting.      PT LONG TERM GOAL #2    Title 5/5 bilat hip ext and Improve spinal mobility/hamstring flexibility to [facility] 03/11/16   Time 6   Period Weeks   Status On-going     PT LONG TERM GOAL #3   Title Decrease Lt LE pain and radicular symptoms 75-80% per pt report 03/11/16   Time 6   Period Weeks   Status On-going  pt reports 60% reduction of symptoms.      PT LONG TERM GOAL #4   Title I in HEP 03/11/16   Time 6   Period Weeks   Status On-going     PT LONG TERM GOAL #5   Title Improve FOTO to </= 40% limitation 05/15/15   Time 6   Period Weeks   Status On-going               Plan - 02/13/16 1152    Clinical Impression Statement Increased pain in the Lt LE since last visit. Continues to experience Lt "foot drop" when walking. Now beginning to consider surgery consult- at least see MD. Minimal weakness to manual muscle testing Lt ankle and great toe compared to Rt. patient reports sensation of more weakness and the "drop foot" when she is walking. Noted tightness through the psoas and piriformis/glut med on LT. Some pain and tightness wtih PA mobs L4/5/S1. Lawernce Pitts to contact MD for re-eval for Lt LE weakness.    Rehab Potential Good   PT Frequency 2x / week   PT Duration 6 weeks   PT Treatment/Interventions Patient/family education;ADLs/Self Care Home Management;Neuromuscular re-education;Cryotherapy;Electrical Stimulation;Iontophoresis 4mg /ml Dexamethasone;Moist Heat;Traction;Ultrasound;Dry needling;Manual techniques;Therapeutic activities;Therapeutic exercise   PT Next Visit Plan assess response to prone traction - experienced incresae in symptoms following supine tractioin, possible TDN again with stim to lumbar paraspinals and Lt hip musculature.  Spinal stabilzation progression as tolerated    Consulted and Agree with Plan of Care Patient      Patient will benefit from skilled therapeutic intervention in order to improve the following deficits and impairments:  Postural dysfunction, Improper body  mechanics, Pain, Increased muscle spasms, Increased fascial restricitons, Decreased mobility, Decreased activity tolerance  Visit Diagnosis: Pain of left lower extremity  Lumbar pain with radiation down left leg  Lumbar radiculopathy  Other symptoms and signs involving the musculoskeletal system  Muscle weakness (generalized)     Problem  List Patient Active Problem List   Diagnosis Date Noted  . Eustachian tube dysfunction, bilateral 11/27/2015  . Osteopenia 04/18/2015  . Lumbar pain with radiation down left leg 03/03/2015  . Trochanteric bursitis of left hip 03/03/2015  . Inverted nipple 02/06/2013  . Vitamin D deficiency 01/27/2012  . Insomnia 11/01/2011  . Pericardial effusion 01/08/2011  . Essential hypertension, benign 09/16/2010  . FOOT PAIN, RIGHT 01/19/2010  . B12 DEFICIENCY 12/26/2008  . IRREGULAR MENSES 07/31/2008  . Hypothyroidism 04/25/2008  . DEPRESSION, MILD 04/25/2008  . PERIMENOPAUSAL STATUS 04/25/2008    Benjamin Merrihew Nilda Simmer  PT, MPH  02/13/2016, 1:06 PM  [facility] [place], [place], [address] Phone: [phone]   Fax:  [phone]  Name: CALEA HEMPSTEAD MRN: [id number] Date of Birth: 1960-08-30

## 2016-02-18 ENCOUNTER — Encounter: Payer: Self-pay | Admitting: Sports Medicine

## 2016-02-18 ENCOUNTER — Ambulatory Visit (INDEPENDENT_AMBULATORY_CARE_PROVIDER_SITE_OTHER): Payer: BLUE CROSS/BLUE SHIELD | Admitting: Sports Medicine

## 2016-02-18 DIAGNOSIS — M51369 Other intervertebral disc degeneration, lumbar region without mention of lumbar back pain or lower extremity pain: Secondary | ICD-10-CM

## 2016-02-18 DIAGNOSIS — H6993 Unspecified Eustachian tube disorder, bilateral: Secondary | ICD-10-CM

## 2016-02-18 DIAGNOSIS — M5136 Other intervertebral disc degeneration, lumbar region: Secondary | ICD-10-CM

## 2016-02-18 DIAGNOSIS — H6983 Other specified disorders of Eustachian tube, bilateral: Secondary | ICD-10-CM

## 2016-02-18 MED ORDER — PHENYLEPHRINE HCL 10 MG PO TABS: 10.0000 mg | ORAL_TABLET | Freq: Three times a day (TID) | ORAL | 0 refills | Status: DC

## 2016-02-18 MED ORDER — PREDNISONE 10 MG (48) PO TBPK: ORAL_TABLET | Freq: Every day | ORAL | 0 refills | Status: DC

## 2016-02-18 NOTE — Assessment & Plan Note (Signed)
Continue Flonase, adding phenylephrine. I have also called [facility], and they will work on getting her an appointment later today. They will call her.

## 2016-02-18 NOTE — Progress Notes (Signed)
   Subjective:    I'm seeing this patient as a consultation for:  Dr. Beatrice Lecher  CC: Foot drop, eustachian tube dysfunction  HPI: This is a pleasant 55 year old female, she has known lumbar degenerative disc disease, she's had several epidurals on the left at the L4-L5 level that have provided good relief of her axial back pain, more recently she's noted a decrease in the strength in her foot, was seen in physical therapy where they suspected foot drop, she was referred to me for further evaluation and definitive treatment.  Eustachian dysfunction: Hears a rushing sound in her left ear,  This is been going on for many months, initially she did well with prednisone and intranasal steroids, unfortunately having a recurrence of symptoms, she does feel pressure behind her left ear, but denies any tinnitus, no dizziness. No focal neurologic symptoms with the exception of what was mentioned above.  Past medical history:  Negative.  See flowsheet/record as well for more information.  Surgical history: Negative.  See flowsheet/record as well for more information.  Family history: Negative.  See flowsheet/record as well for more information.  Social history: Negative.  See flowsheet/record as well for more information.  Allergies, and medications have been entered into the medical record, reviewed, and no changes needed.   Review of Systems: No headache, visual changes, nausea, vomiting, diarrhea, constipation, dizziness, abdominal pain, skin rash, fevers, chills, night sweats, weight loss, swollen lymph nodes, body aches, joint swelling, muscle aches, chest pain, shortness of breath, mood changes, visual or auditory hallucinations.   Objective:   General: Well Developed, well nourished, and in no acute distress.  Neuro/Psych: Alert and oriented x3, extra-ocular muscles intact, able to move all 4 extremities, sensation grossly intact. ENT: Oropharynx, nasopharynx, ear canals unremarkable, no  mastoid tenderness, neck is supple. No masses. Thyroid nonpalpable.  Skin: Warm and dry, no rashes noted.  Respiratory: Not using accessory muscles, speaking in full sentences, trachea midline.  Cardiovascular: Pulses palpable, no extremity edema. Abdomen: Does not appear distended. Back Exam:  Inspection: Unremarkable  Motion: Flexion 45 deg, Extension 45 deg, Side Bending to 45 deg bilaterally,  Rotation to 45 deg bilaterally  SLR laying: Negative  XSLR laying: Negative  Palpable tenderness: None. FABER: negative. Sensory change: Gross sensation intact to all lumbar and sacral dermatomes.  Reflexes: 2+ at both patellar tendons, 2+ at achilles tendons, Babinski's downgoing.  Strength at foot, left side  Plantar-flexion: 5/5 Dorsi-flexion: 4-/5 Eversion: 5/5 Inversion: 5/5  Leg strength  Quad: 5/5 Hamstring: 5/5 Hip flexor: 5/5 Hip abductors: 5/5  Gait unremarkable.  Impression and Recommendations:   This case required medical decision making of moderate complexity.  Lumbar degenerative disc disease with left-sided foot drop Has had multiple epidurals, all has helped her axial pain, unfortunately has continued to have left-sided radicular pain with the new one set foot drop. Because of the new once at foot drop, as well as her already having 3 epidurals, as well as formal physical therapy we are going to proceed with a new MRI, ultimately I do think she will need some surgical intervention. Return to see me to go over MRI results. I'm going to add a prednisone taper in the meantime.

## 2016-02-18 NOTE — Assessment & Plan Note (Signed)
Has had multiple epidurals, all has helped her axial pain, unfortunately has continued to have left-sided radicular pain with the new one set foot drop. Because of the new once at foot drop, as well as her already having 3 epidurals, as well as formal physical therapy we are going to proceed with a new MRI, ultimately I do think she will need some surgical intervention. Return to see me to go over MRI results. I'm going to add a prednisone taper in the meantime.

## 2016-02-19 ENCOUNTER — Encounter: Payer: BLUE CROSS/BLUE SHIELD | Admitting: Rehabilitative and Restorative Service Providers"

## 2016-02-20 ENCOUNTER — Encounter: Payer: BLUE CROSS/BLUE SHIELD | Admitting: Physical Therapy

## 2016-02-24 ENCOUNTER — Ambulatory Visit (INDEPENDENT_AMBULATORY_CARE_PROVIDER_SITE_OTHER): Payer: BLUE CROSS/BLUE SHIELD | Admitting: Family Medicine

## 2016-02-24 ENCOUNTER — Encounter: Payer: Self-pay | Admitting: Family Medicine

## 2016-02-24 VITALS — BP 141/75 | HR 73

## 2016-02-24 DIAGNOSIS — M5136 Other intervertebral disc degeneration, lumbar region: Secondary | ICD-10-CM

## 2016-02-24 NOTE — Progress Notes (Signed)
Rachel Mack is a 56 y.o. female who presents to [facility] today for left foot drop complicating left lumbar radiculopathy. Patient is being seen several times for lumbago and lumbar radiculopathy. She's had multiple epidural steroid injections that tend to work for a few months. She was last seen on November 28 for an exacerbation of left-sided lumbar radiculopathy. At that visit an epidural steroid injection was ordered and she was referred to physical therapy. She notes that the pain radiating down her left leg improved however during physical therapy she was found to have a foot drop. This was subtle and noticed after she complained that she was scuffing her foot along the ground and stopping her toe occasionally. She thinks the foot symptoms have been ongoing now for a few weeks. In response to this she was seen by my colleague Dr. Aundria Mems on December 27 where 4+/5 left foot dorsiflexion strength was noted compared to 5/5 foot and lower extremity strength. At that visit on MRI was ordered through [facility]. Patient is here today to follow-up the MRI. Additionally she was prescribed prednisone. She notes her pain has largely resolved however she continues to experience mild foot dorsiflexion weakness. Occasionally she has pain in her groin but denies any bowel or bladder dysfunction or numbness.   Past Medical History:  Diagnosis Date  . Hypertension   . Hypothyroidism   . Nephrolithiasis    nonobstructive; has never passed one  . Perimenopausal    helped regulate menses  . Vitamin B 12 deficiency    Past Surgical History:  Procedure Laterality Date  . GYNECOLOGIC CRYOSURGERY  2005 or before   cervicitis (Dr. Lissa Hoard in [place])   Social History  Substance Use Topics  . Smoking status: Former Smoker    Quit date: 02/23/2004  . Smokeless  tobacco: Never Used  . Alcohol use No   family history includes Breast cancer (age of onset: 58) in her mother; Colon cancer (age of onset: 91) in her paternal grandmother; Diabetes in her mother; Drug abuse in her sister; Hyperlipidemia in her mother; Hypertension in her mother.  ROS as above:  Medications: Current Outpatient Prescriptions  Medication Sig Dispense Refill  . ALPRAZolam (XANAX) 0.25 MG tablet Take 1 tablet (0.25 mg total) by mouth at bedtime as needed. for sleep.  NEED FOLLOW UP VISIT FOR MORE REFILLS 30 tablet 3  . cyanocobalamin (,VITAMIN B-12,) 1000 MCG/ML injection INJECT 1ML (1000MCG TOTAL) INTO THE MUSCLE ONCE AS    DIRECTED DISCARD 28 DAYS   AFTER  FIRST USE 3 mL 2  . fluticasone (FLONASE) 50 MCG/ACT nasal spray One spray in each nostril twice a day, use left hand for right nostril, and right hand for left nostril. 48 g 3  . gabapentin (NEURONTIN) 300 MG capsule One tab PO qHS for a week, then BID for a week, then TID. May double weekly to a max of 3,600mg /day 180 capsule 3  . hydrochlorothiazide (MICROZIDE) 12.5 MG capsule TAKE 1 CAPSULE EVERY       MORNING 90 capsule 2  . MISC NATURAL PRODUCTS PO Take by mouth. Estrogen Balance    . Nutritional Supplements (MENOPAUSE FORMULA PO) Take by mouth. Menocare    . phenylephrine (SUDAFED PE) 10 MG TABS tablet Take 1 tablet (10 mg total) by mouth every 8 (eight) hours. 30 tablet 0  . predniSONE (STERAPRED UNI-PAK 48 TAB) 10 MG (48) TBPK tablet  Take by mouth daily. Use as directed for taper 1 tablet 0  . SYNTHROID 50 MCG tablet TAKE 1 TABLET (50 MCG TOTAL) BY MOUTH DAILY. BRAND MEDICALLY NECCESSARY 90 tablet 1  . UNABLE TO FIND Med Name: Menocare     No current facility-administered medications for this visit.    Allergies  Allergen Reactions  . Sulfa Antibiotics   . Amoxicillin Other (See Comments)    yeast infection  . Bupropion Hcl Palpitations    Health Maintenance Health Maintenance  Topic Date Due  . INFLUENZA  VACCINE  10/23/2016 (Originally 09/23/2015)  . PAP SMEAR  10/11/2016  . MAMMOGRAM  12/11/2017  . TETANUS/TDAP  12/27/2018  . COLONOSCOPY  06/13/2024  . Hepatitis C Screening  Completed  . HIV Screening  Completed     Exam:  BP (!) 141/75   Pulse 73  Gen: Well NAD MSK: L-spine nontender. Normal motion. Intact lower shin he strength exception of mild foot dorsiflexion weakness on the left 4+/5. Sensation is intact throughout. Foot drop is not especially appreciable on gait.  MRI Lumbar Spine Wo Contrast12/29/2017 [facility] Result Impression  IMPRESSION:   Mild degenerative changes. No disc herniation or spinal stenosis. No specific cause for a left leg radiculopathy.  No interval change compared with 08/19/2015  Result Narrative  EXAMINATION: MRI lumbar spine without contrast  CLINICAL INDICATION: Left foot drop and left leg weakness. Numbness and tingling. Symptoms for 6 to 8 months.  TECHNIQUE: MRI lumbar spine protocol without contrast.   COMPARISON: 08/19/2015  FINDINGS:  Bone marrow signal: Normal  Conus medullaris and cauda equina: Normal  L1-L2: Normal  L2-L3: There is a mild disc bulge. Otherwise normal.  L3-L4: There is a mild disc bulge. Otherwise normal  L4-L5: There is a raw based disc bulge which extends slightly more to the left. Mild stenosis of the lateral recess on both sides. No significant facet disease. No foraminal stenosis.  L5-S1: Normal        No results found for this or any previous visit (from the past 72 hour(s)). No results found.    Assessment and Plan: 56 y.o. female with Chronic intermittent left-sided lumbar radiculopathy with relatively new mild foot dorsiflexion weakness with MRI findings consistent with left L4 radiculopathy. Patient overall seems to be doing okay today. She does not have any severe or rapidly worsening extremity weakness but the left foot weakness is relatively new. Additionally she's had intermittent  continuous recurrent lumbar radiculopathy for about a year now. She's become frightened frustrated and feels that the lumbar epidural steroid injections or just an endless cycle and her not getting her well again.  I agree and I think it's reasonable for her to have a consultation with a spinal surgeon to discuss her surgical options and whether she should continue intermittent epidural steroid injections and physical therapy.  We will refer to Dr. Lynann Bologna. Follow-up as needed.   No orders of the defined types were placed in this encounter.   Discussed warning signs or symptoms. Please see discharge instructions. Patient expresses understanding.   CC: METHENEY,CATHERINE, MD Dr Lynann Bologna

## 2016-02-24 NOTE — Patient Instructions (Signed)
Thank you for coming in today. You should hear from [facility] soon.  Return soon if needed.   You can do what ever non-Balastic exercise you want. No trampolines, horse riding [employer].  Listen to your body reduce your activity if you hurt.   Come back or go to the emergency room if you notice new weakness new numbness problems walking or bowel or bladder problems.

## 2016-02-25 ENCOUNTER — Ambulatory Visit (INDEPENDENT_AMBULATORY_CARE_PROVIDER_SITE_OTHER): Payer: BLUE CROSS/BLUE SHIELD | Admitting: Physical Therapy

## 2016-02-25 DIAGNOSIS — R29898 Other symptoms and signs involving the musculoskeletal system: Secondary | ICD-10-CM | POA: Diagnosis not present

## 2016-02-25 DIAGNOSIS — M545 Low back pain, unspecified: Secondary | ICD-10-CM

## 2016-02-25 DIAGNOSIS — M5416 Radiculopathy, lumbar region: Secondary | ICD-10-CM

## 2016-02-25 DIAGNOSIS — M79605 Pain in left leg: Secondary | ICD-10-CM

## 2016-02-25 NOTE — Therapy (Addendum)
[place], [place], [address] Phone: [phone]   Fax:  [phone]  Physical Therapy Treatment  Patient Details  Name: Rachel Mack MRN: [id number] Date of Birth: [birth year] Referring Provider: Dr. Lynne Leader  Encounter Date: 02/25/2016      PT End of Session - 02/25/16 1108    Visit Number 6   Number of Visits 12   Date for PT Re-Evaluation 03/10/16   PT Start Time 1106   PT Stop Time 1142   PT Time Calculation (min) 36 min   Activity Tolerance Patient tolerated treatment well;No increased pain   Behavior During Therapy WFL for tasks assessed/performed      Past Medical History:  Diagnosis Date  . Hypertension   . Hypothyroidism   . Nephrolithiasis    nonobstructive; has never passed one  . Perimenopausal    helped regulate menses  . Vitamin B 12 deficiency     Past Surgical History:  Procedure Laterality Date  . GYNECOLOGIC CRYOSURGERY  2005 or before   cervicitis (Dr. Lissa Hoard in [place])    There were no vitals filed for this visit.      Subjective Assessment - 02/25/16 1109    Subjective Pt reports she doesn't feel well; requests shortened session.  She has been on prednisone for 5 days for sinus pressure (going to ENT for further eval tomorrow. Has 5 more days of medicine). She's noticed having less radicular symptoms while on the steroids.  Walked on treadmill for 10 min yesterday, but pain in LLE increased so she stopped.  Pt is being referred to surgeon for foot drop.    Patient Stated Goals get rid of the pain in the back and leg    Currently in Pain? No/denies   Pain Score 0-No pain   Pain Location Leg   Pain Orientation Left            [facility] PT Assessment - 02/25/16 0001      Assessment   Medical Diagnosis Lt lumbar radiculopathy   Referring Provider Dr. Lynne Leader   Onset Date/Surgical Date 03/09/15   Hand Dominance Right   Next MD Visit PRN      Flexibility   Hamstrings Lt 70 deg.            [facility] Adult PT Treatment/Exercise - 02/25/16 0001      Lumbar Exercises: Stretches   Passive Hamstring Stretch 3 reps;30 seconds  supine with strap   Press Ups --  10x 3sec   Piriformis Stretch 2 reps;30 seconds  LLE     Lumbar Exercises: Aerobic   Stationary Bike Nustep L5 x 6 min      Moist Heat Therapy   Number Minutes Moist Heat 17 Minutes   Moist Heat Location Lumbar Spine     Traction   Type of Traction Lumbar  prone   Min (lbs) 35   Max (lbs) 45   Hold Time 60   Rest Time 20   Time 17           PT Long Term Goals - 02/25/16 1131      PT LONG TERM GOAL #1   Title Improve sitting; standing tolerance to 1 hours 03/12/15   Time 6   Period Weeks   Status Partially Met  able to stand 1 hr, and 30 min in supportive chair     PT LONG TERM GOAL #2   Title 5/5 bilat hip  ext and Improve spinal mobility/hamstring flexibility to [facility] 03/11/16   Time 6   Period Weeks   Status On-going     PT LONG TERM GOAL #3   Title Decrease Lt LE pain and radicular symptoms 75-80% per pt report 03/11/16   Period Weeks   Status On-going  60% reduction      PT LONG TERM GOAL #4   Title I in HEP 03/11/16   Time 6   Period Weeks   Status On-going     PT LONG TERM GOAL #5   Title Improve FOTO to </= 40% limitation 05/15/15   Time 6   Status On-going               Plan - 02/25/16 1129    Clinical Impression Statement Pt tolerated last sessions prone traction well.  Pt's treatment time limited at pt's request, due to her not feeling well. She did demonstrate improved hamstring flexibility on LLE today.  Pt making gradual progress towards goals.    Rehab Potential Good   PT Frequency 2x / week   PT Duration 6 weeks   PT Treatment/Interventions Patient/family education;ADLs/Self Care Home Management;Neuromuscular re-education;Cryotherapy;Electrical Stimulation;Iontophoresis 55m/ml Dexamethasone;Moist  Heat;Traction;Ultrasound;Dry needling;Manual techniques;Therapeutic activities;Therapeutic exercise   PT Next Visit Plan possible TDN again with stim to lumbar paraspinals and Lt hip musculature.  Spinal stabilzation progression as tolerated    Consulted and Agree with Plan of Care Patient      Patient will benefit from skilled therapeutic intervention in order to improve the following deficits and impairments:  Postural dysfunction, Improper body mechanics, Pain, Increased muscle spasms, Increased fascial restricitons, Decreased mobility, Decreased activity tolerance  Visit Diagnosis: Pain of left lower extremity  Lumbar pain with radiation down left leg  Lumbar radiculopathy  Other symptoms and signs involving the musculoskeletal system     Problem List Patient Active Problem List   Diagnosis Date Noted  . Eustachian tube dysfunction, bilateral 11/27/2015  . Osteopenia 04/18/2015  . 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 Inverted nipple 02/06/2013  . Vitamin D deficiency 01/27/2012  . Insomnia 11/01/2011  . Pericardial effusion 01/08/2011  . Essential hypertension, benign 09/16/2010  . FOOT PAIN, RIGHT 01/19/2010  . B12 DEFICIENCY 12/26/2008  . IRREGULAR MENSES 07/31/2008  . Hypothyroidism 04/25/2008  . DEPRESSION, MILD 04/25/2008  . PERIMENOPAUSAL STATUS 04/25/2008   JKerin Perna PTA 02/25/16 11:39 AM  [facility] [address] [address]Phone: [phone]  Fax:  [phone] Name: Rachel MURTHAMRN: [id number] of Birth: [birth year] PHYSICAL THERAPY DISCHARGE SUMMARY  Visits from Start of Care: 6  Current functional level related to goals / functional outcomes: See last progress note for functional status at discharge   Remaining deficits: See note   Education / Equipment: HEP Plan: Patient agrees to discharge.  Patient  goals were partially met. Patient is being discharged due to not returning since the last visit.  ?????     Celyn P. HHelene KelpPT, MPH 03/24/16 2:33 PM

## 2016-03-01 ENCOUNTER — Encounter: Payer: BLUE CROSS/BLUE SHIELD | Admitting: Rehabilitative and Restorative Service Providers"

## 2016-03-03 ENCOUNTER — Encounter: Payer: BLUE CROSS/BLUE SHIELD | Admitting: Rehabilitative and Restorative Service Providers"

## 2016-03-22 ENCOUNTER — Ambulatory Visit: Payer: BLUE CROSS/BLUE SHIELD | Admitting: Family Medicine

## 2016-04-17 ENCOUNTER — Emergency Department (HOSPITAL_COMMUNITY)
Admission: EM | Admit: 2016-04-17 | Discharge: 2016-04-17 | Disposition: A | Discharge status: Home / Self Care | Payer: BLUE CROSS/BLUE SHIELD | Attending: Emergency Medicine | Admitting: Emergency Medicine

## 2016-04-17 ENCOUNTER — Encounter (HOSPITAL_COMMUNITY): Payer: Self-pay

## 2016-04-17 ENCOUNTER — Emergency Department (HOSPITAL_COMMUNITY): Payer: BLUE CROSS/BLUE SHIELD

## 2016-04-17 DIAGNOSIS — Y999 Unspecified external cause status: Secondary | ICD-10-CM | POA: Insufficient documentation

## 2016-04-17 DIAGNOSIS — Z79899 Other long term (current) drug therapy: Secondary | ICD-10-CM | POA: Insufficient documentation

## 2016-04-17 DIAGNOSIS — Z87891 Personal history of nicotine dependence: Secondary | ICD-10-CM | POA: Insufficient documentation

## 2016-04-17 DIAGNOSIS — Y939 Activity, unspecified: Secondary | ICD-10-CM | POA: Diagnosis not present

## 2016-04-17 DIAGNOSIS — I10 Essential (primary) hypertension: Secondary | ICD-10-CM | POA: Diagnosis not present

## 2016-04-17 DIAGNOSIS — E039 Hypothyroidism, unspecified: Secondary | ICD-10-CM | POA: Diagnosis not present

## 2016-04-17 DIAGNOSIS — Y92410 Unspecified street and highway as the place of occurrence of the external cause: Secondary | ICD-10-CM | POA: Insufficient documentation

## 2016-04-17 DIAGNOSIS — S82402A Unspecified fracture of shaft of left fibula, initial encounter for closed fracture: Secondary | ICD-10-CM | POA: Insufficient documentation

## 2016-04-17 DIAGNOSIS — S8992XA Unspecified injury of left lower leg, initial encounter: Secondary | ICD-10-CM | POA: Diagnosis present

## 2016-04-17 MED ORDER — TRAMADOL HCL 50 MG PO TABS: 50.0000 mg | ORAL_TABLET | Freq: Four times a day (QID) | ORAL | 0 refills | Status: DC | PRN

## 2016-04-17 MED ORDER — ACETAMINOPHEN 325 MG PO TABS
650.0000 mg | ORAL_TABLET | Freq: Once | ORAL | Status: AC
  Administered 2016-04-17: 650 mg via ORAL
  Filled 2016-04-17: qty 2
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
## 2016-04-17 NOTE — Discharge Instructions (Signed)
Take motrin for pain.   Take tramadol for severe pain.   Apply ice on the leg.   Expect some swelling and pain of the ankle.   You should use crutches and splint,.   See Dr. Stann Mainland for follow up xrays in a week.   Return to ER if you have severe pain, worse swelling, toes turning blue.
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
## 2016-04-17 NOTE — ED Notes (Signed)
Patient transported to X-ray 

## 2016-04-19 ENCOUNTER — Ambulatory Visit (INDEPENDENT_AMBULATORY_CARE_PROVIDER_SITE_OTHER): Payer: BLUE CROSS/BLUE SHIELD | Admitting: Family Medicine

## 2016-04-19 VITALS — BP 139/89 | HR 85

## 2016-04-19 DIAGNOSIS — M79671 Pain in right foot: Secondary | ICD-10-CM | POA: Diagnosis not present

## 2016-04-19 DIAGNOSIS — M79641 Pain in right hand: Secondary | ICD-10-CM

## 2016-04-19 NOTE — Patient Instructions (Addendum)
Thank you for coming in today. Recheck in about 1 week.  Use the cam walker 24 hours a day.  Use the wrist splint.  Try to avoid putting weight on the foot.    Ankle Fracture A fracture is a break in a bone. A cast or splint may be used to protect the ankle and heal the break. Sometimes, surgery is needed. Follow these instructions at home:  Use crutches as told by your doctor. It is very important that you use your crutches correctly.  Do not put weight or pressure on the injured ankle until told by your doctor.  Keep your ankle raised (elevated) when sitting or lying down.  Apply ice to the ankle:  Put ice in a plastic bag.  Place a towel between your cast and the bag.  Leave the ice on for 20 minutes, 2-3 times a day.  If you have a plaster or fiberglass cast:  Do not try to scratch under the cast with any objects.  Check the skin around the cast every day. You may put lotion on red or sore areas.  Keep your cast dry and clean.  If you have a plaster splint:  Wear the splint as told by your doctor.  You can loosen the elastic around the splint if your toes get numb, tingle, or turn cold or blue.  Do not put pressure on any part of your cast or splint. It may break. Rest your plaster splint or cast only on a pillow the first 24 hours until it is fully hardened.  Cover your cast or splint with a plastic bag during showers.  Do not lower your cast or splint into water.  Take medicine as told by your doctor.  Do not drive until your doctor says it is safe.  Follow-up with your doctor as told. It is very important that you go to your follow-up visits. Contact a doctor if: The swelling and discomfort gets worse. Get help right away if:  Your splint or cast breaks.  You continue to have very bad pain.  You have new pain or swelling after your splint or cast was put on.  Your skin or toes below the injured ankle:  Turn blue or gray.  Feel cold, numb, or you  cannot feel them.  There is a bad smell or yellowish white fluid (pus) coming from under the splint or cast. This information is not intended to replace advice given to you by your health care provider. Make sure you discuss any questions you have with your health care provider. Document Released: 12/06/2008 Document Revised: 07/17/2015 Document Reviewed: 09/07/2012 Elsevier Interactive Patient Education  2017 [employer].
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
## 2016-04-20 ENCOUNTER — Ambulatory Visit: Payer: BLUE CROSS/BLUE SHIELD | Admitting: Family Medicine

## 2016-04-26 ENCOUNTER — Ambulatory Visit (INDEPENDENT_AMBULATORY_CARE_PROVIDER_SITE_OTHER): Payer: BLUE CROSS/BLUE SHIELD

## 2016-04-26 ENCOUNTER — Ambulatory Visit (INDEPENDENT_AMBULATORY_CARE_PROVIDER_SITE_OTHER): Payer: BLUE CROSS/BLUE SHIELD | Admitting: Family Medicine

## 2016-04-26 VITALS — BP 146/85 | HR 91

## 2016-04-26 DIAGNOSIS — S2231XA Fracture of one rib, right side, initial encounter for closed fracture: Secondary | ICD-10-CM | POA: Insufficient documentation

## 2016-04-26 DIAGNOSIS — S82832A Other fracture of upper and lower end of left fibula, initial encounter for closed fracture: Secondary | ICD-10-CM | POA: Insufficient documentation

## 2016-04-26 DIAGNOSIS — S52501A Unspecified fracture of the lower end of right radius, initial encounter for closed fracture: Secondary | ICD-10-CM | POA: Insufficient documentation

## 2016-04-26 DIAGNOSIS — R0781 Pleurodynia: Secondary | ICD-10-CM

## 2016-04-26 DIAGNOSIS — S52571D Other intraarticular fracture of lower end of right radius, subsequent encounter for closed fracture with routine healing: Secondary | ICD-10-CM

## 2016-04-26 DIAGNOSIS — S52614D Nondisplaced fracture of right ulna styloid process, subsequent encounter for closed fracture with routine healing: Secondary | ICD-10-CM | POA: Diagnosis not present

## 2016-04-26 DIAGNOSIS — S62151A Displaced fracture of hook process of hamate [unciform] bone, right wrist, initial encounter for closed fracture: Secondary | ICD-10-CM | POA: Insufficient documentation

## 2016-04-26 DIAGNOSIS — S82832D Other fracture of upper and lower end of left fibula, subsequent encounter for closed fracture with routine healing: Secondary | ICD-10-CM

## 2016-04-26 DIAGNOSIS — M25531 Pain in right wrist: Secondary | ICD-10-CM

## 2016-04-26 DIAGNOSIS — S52571A Other intraarticular fracture of lower end of right radius, initial encounter for closed fracture: Secondary | ICD-10-CM | POA: Diagnosis not present

## 2016-04-26 DIAGNOSIS — S52514D Nondisplaced fracture of right radial styloid process, subsequent encounter for closed fracture with routine healing: Secondary | ICD-10-CM | POA: Diagnosis not present

## 2016-04-26 HISTORY — DX: Other fracture of upper and lower end of left fibula, initial encounter for closed fracture: S82.832A

## 2016-04-26 HISTORY — DX: Displaced fracture of hook process of hamate (unciform) bone, right wrist, initial encounter for closed fracture: S62.151A

## 2016-04-26 HISTORY — DX: Unspecified fracture of the lower end of right radius, initial encounter for closed fracture: S52.501A

## 2016-04-26 NOTE — Patient Instructions (Signed)
Thank you for coming in today. You have a fracture of the hook of the hamate bone in the wrist.  We will continue to splint in the velocro splint.  This will likely need surgery but there is a chance that it will heal well.  Let continue to keep the weight off your ankle as much as possible.  I recommend getting a knee walker.   Try calling a medical supply company.  [facility].   Recheck in 2 weeks.   It will be ok.
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
## 2016-04-27 ENCOUNTER — Ambulatory Visit (INDEPENDENT_AMBULATORY_CARE_PROVIDER_SITE_OTHER): Payer: BLUE CROSS/BLUE SHIELD | Admitting: Family Medicine

## 2016-04-27 VITALS — BP 113/83

## 2016-04-27 DIAGNOSIS — S52571A Other intraarticular fracture of lower end of right radius, initial encounter for closed fracture: Secondary | ICD-10-CM

## 2016-04-27 DIAGNOSIS — S62151A Displaced fracture of hook process of hamate [unciform] bone, right wrist, initial encounter for closed fracture: Secondary | ICD-10-CM
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
## 2016-04-27 NOTE — Patient Instructions (Signed)
Thank you for coming in today. Recheck in 2 weeks as scheduled.  We will replace the cast then.  Hang on to the splint.  Recheck sooner if needed.   Cast or Splint Care, Adult Casts and splints are supports that are worn to protect broken bones and other injuries. A cast or splint may hold a bone still and in the correct position while it heals. Casts and splints may also help ease pain, swelling, and muscle spasms. A cast is a hardened support that is usually made of fiberglass or plaster. It is custom-fit to the body and it offers more protection than a splint. It cannot be taken off and put back on. A splint is a type of soft support that is usually made from cloth and elastic. It can be adjusted or taken off as needed. You may need a cast or a splint if you:  Have a broken bone.  Have a soft-tissue injury.  Need to keep an injured body part from moving (keep it immobile) after surgery. How is this treated? If you have a cast:   Do not stick anything inside the cast to scratch your skin. Sticking something in the cast increases your risk of infection.  Check the skin around the cast every day. Tell your health care provider about any concerns.  You may put lotion on dry skin around the edges of the cast. Do not put lotion on the skin underneath the cast.  Keep the cast clean.  If the cast is not waterproof:  Do not let it get wet.  Cover it with a watertight covering when you take a bath or a shower. If you have a splint:   Wear it as told by your health care provider. Remove it only as told by your health care provider.  Loosen the splint if your fingers or toes tingle, become numb, or turn cold and blue.  Keep the splint clean.  If the splint is not waterproof:  Do not let it get wet.  Cover it with a watertight covering when you take a bath or a shower. Bathing   Do not take baths or swim until your health care provider approves. Ask your health care provider if  you can take showers. You may only be allowed to take sponge baths for bathing.  If your cast or splint is not waterproof, cover it with a watertight covering when you take a bath or shower. Managing pain, stiffness, and swelling   Move your fingers or toes often to avoid stiffness and to lessen swelling.  Raise (elevate) the injured area above the level of your heart while sitting or lying down. Safety   Do not use the injured limb to support your body weight until your health care provider says that it is okay.  Use crutches or other assistive devices as told by your health care provider. General instructions   Do not put pressure on any part of the cast or splint until it is fully hardened. This may take several hours.  Return to your normal activities as told by your health care provider. Ask your health care provider what activities are safe for you.  Take over-the-counter and prescription medicines only as told by your health care provider.  Keep all follow-up visits as told by your health care provider. This is important. Contact a health care provider if:  Your cast or splint gets damaged.  The skin around the cast gets red or raw.  The  skin under the cast is extremely itchy or painful.  Your cast or splint feels very uncomfortable.  Your cast or splint is too tight or too loose.  Your cast becomes wet or it develops a soft spot or area.  You get an object stuck under your cast. Get help right away if:  Your pain is getting worse.  The injured area tingles, becomes numb, or turns cold and blue.  The part of your body above or below the cast is swollen and discolored.  You cannot feel or move your fingers or toes.  There is fluid leaking through the cast.  You have severe pain or pressure under the cast.  You have trouble breathing.  You have shortness of breath.  You have chest pain. This information is not intended to replace advice given to you by  your health care provider. Make sure you discuss any questions you have with your health care provider. Document Released: 02/06/2000 Document Revised: 08/30/2015 Document Reviewed: 08/02/2015 Elsevier Interactive Patient Education  2017 [employer].

## 2016-05-10 ENCOUNTER — Encounter: Payer: Self-pay | Admitting: Family Medicine

## 2016-05-10 ENCOUNTER — Ambulatory Visit (INDEPENDENT_AMBULATORY_CARE_PROVIDER_SITE_OTHER): Payer: BLUE CROSS/BLUE SHIELD | Admitting: Family Medicine

## 2016-05-10 ENCOUNTER — Ambulatory Visit (INDEPENDENT_AMBULATORY_CARE_PROVIDER_SITE_OTHER): Payer: BLUE CROSS/BLUE SHIELD

## 2016-05-10 VITALS — BP 122/83 | HR 80

## 2016-05-10 DIAGNOSIS — S62151A Displaced fracture of hook process of hamate [unciform] bone, right wrist, initial encounter for closed fracture: Secondary | ICD-10-CM

## 2016-05-10 DIAGNOSIS — S52571A Other intraarticular fracture of lower end of right radius, initial encounter for closed fracture: Secondary | ICD-10-CM | POA: Diagnosis not present

## 2016-05-10 DIAGNOSIS — S82832A Other fracture of upper and lower end of left fibula, initial encounter for closed fracture: Secondary | ICD-10-CM

## 2016-05-10 DIAGNOSIS — S62151D Displaced fracture of hook process of hamate [unciform] bone, right wrist, subsequent encounter for fracture with routine healing: Secondary | ICD-10-CM | POA: Diagnosis not present

## 2016-05-10 DIAGNOSIS — S82832D Other fracture of upper and lower end of left fibula, subsequent encounter for closed fracture with routine healing: Secondary | ICD-10-CM

## 2016-05-10 NOTE — Patient Instructions (Signed)
Thank you for coming in today. Recheck in 2 weeks.  Return sooner if needed.
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
## 2016-05-14 ENCOUNTER — Other Ambulatory Visit: Payer: Self-pay | Admitting: Family Medicine

## 2016-05-24 ENCOUNTER — Telehealth: Payer: Self-pay | Admitting: Emergency Medicine

## 2016-05-24 ENCOUNTER — Ambulatory Visit (INDEPENDENT_AMBULATORY_CARE_PROVIDER_SITE_OTHER): Payer: BLUE CROSS/BLUE SHIELD

## 2016-05-24 ENCOUNTER — Ambulatory Visit (INDEPENDENT_AMBULATORY_CARE_PROVIDER_SITE_OTHER): Payer: BLUE CROSS/BLUE SHIELD | Admitting: Family Medicine

## 2016-05-24 DIAGNOSIS — S62151A Displaced fracture of hook process of hamate [unciform] bone, right wrist, initial encounter for closed fracture: Secondary | ICD-10-CM

## 2016-05-24 DIAGNOSIS — S82832A Other fracture of upper and lower end of left fibula, initial encounter for closed fracture: Secondary | ICD-10-CM

## 2016-05-24 DIAGNOSIS — S62151D Displaced fracture of hook process of hamate [unciform] bone, right wrist, subsequent encounter for fracture with routine healing: Secondary | ICD-10-CM | POA: Diagnosis not present

## 2016-05-24 DIAGNOSIS — S82832D Other fracture of upper and lower end of left fibula, subsequent encounter for closed fracture with routine healing: Secondary | ICD-10-CM
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
## 2016-05-24 NOTE — Patient Instructions (Addendum)
Thank you for coming in today. Transition to Aircast.  Recheck in 2 weeks.  Use the regular wrist brace.  Start doing work like activities at home and we will see how your body is handling it.  Go back to the boot if your pain worsens.

## 2016-05-24 NOTE — Telephone Encounter (Signed)
Patient wants to know if she can have needed xrays of hand and ankle done just prior to her visit today? Have [place] call her if this is your desire. [phone]

## 2016-06-07 ENCOUNTER — Ambulatory Visit (INDEPENDENT_AMBULATORY_CARE_PROVIDER_SITE_OTHER): Payer: BLUE CROSS/BLUE SHIELD

## 2016-06-07 ENCOUNTER — Encounter: Payer: Self-pay | Admitting: Family Medicine

## 2016-06-07 ENCOUNTER — Ambulatory Visit (INDEPENDENT_AMBULATORY_CARE_PROVIDER_SITE_OTHER): Payer: BLUE CROSS/BLUE SHIELD | Admitting: Family Medicine

## 2016-06-07 VITALS — BP 130/84 | HR 73

## 2016-06-07 DIAGNOSIS — S82832A Other fracture of upper and lower end of left fibula, initial encounter for closed fracture: Secondary | ICD-10-CM | POA: Diagnosis not present

## 2016-06-07 DIAGNOSIS — S82832D Other fracture of upper and lower end of left fibula, subsequent encounter for closed fracture with routine healing: Secondary | ICD-10-CM

## 2016-06-07 NOTE — Patient Instructions (Signed)
Thank you for coming in today. Return in 2-3 weeks.  Return sooner as needed.  Advance activity as tolerated.

## 2016-06-07 NOTE — Progress Notes (Signed)
Rachel Mack is a 56 y.o. female who presents to [place] today for a ankle and wrist fracture. Patient has been seen several times previously for left distal fibular fracture as well as right wrist distal radius and out of the hamate fracture. In the interim she is doing very well. She is resuming normal ambulation. She is using an over-the-counter ankle brace as well as no wrist brace. She has mild pain. She thinks she is ready to resume work. She would like to go back part-time she thinks that her body can handle right now. She continues to experience some pain and swelling at the ankle and wrist. Symptoms are mild.   Past Medical History:  Diagnosis Date  . Hypertension   . Hypothyroidism   . Nephrolithiasis    nonobstructive; has never passed one  . Perimenopausal    helped regulate menses  . Vitamin B 12 deficiency    Past Surgical History:  Procedure Laterality Date  . GYNECOLOGIC CRYOSURGERY  2005 or before   cervicitis (Dr. Lissa Hoard in [place])   Social History  Substance Use Topics  . Smoking status: Former Smoker    Quit date: 02/23/2004  . Smokeless tobacco: Never Used  . Alcohol use No     ROS:  As above   Medications: Current Outpatient Prescriptions  Medication Sig Dispense Refill  . ALPRAZolam (XANAX) 0.25 MG tablet Take 1 tablet (0.25 mg total) by mouth at bedtime as needed. for sleep.  NEED FOLLOW UP VISIT FOR MORE REFILLS 30 tablet 3  . cyanocobalamin (,VITAMIN B-12,) 1000 MCG/ML injection INJECT 1ML (1000MCG TOTAL) INTO THE MUSCLE ONCE AS    DIRECTED DISCARD 28 DAYS   AFTER  FIRST USE 3 mL 2  . DUEXIS 800-26.6 MG TABS Take 1 tablet by mouth 2 (two) times daily.    . fluticasone (FLONASE) 50 MCG/ACT nasal spray One spray in each nostril twice a day, use left hand for right nostril, and right hand for left nostril. 48 g 3  . hydrochlorothiazide (MICROZIDE) 12.5 MG capsule TAKE 1 CAPSULE EVERY        MORNING 90 capsule 2  . methocarbamol (ROBAXIN) 500 MG tablet Take 500 mg by mouth 4 (four) times daily as needed for muscle spasms.    . Nutritional Supplements (MENOPAUSE FORMULA PO) Take by mouth. Menocare    . phenylephrine (SUDAFED PE) 10 MG TABS tablet Take 1 tablet (10 mg total) by mouth every 8 (eight) hours. 30 tablet 0  . SYNTHROID 50 MCG tablet TAKE 1 TABLET (50 MCG TOTAL) BY MOUTH DAILY. BRAND MEDICALLY NECCESSARY 90 tablet 1   No current facility-administered medications for this visit.    Allergies  Allergen Reactions  . Sulfa Antibiotics   . Amoxicillin Other (See Comments)    yeast infection  . Bupropion Hcl Palpitations     Exam:  BP 130/84   Pulse 73   LMP 04/23/2014  General: Well Developed, well nourished, and in no acute distress.  Neuro/Psych: Alert and oriented x3, extra-ocular muscles intact, able to move all 4 extremities, sensation grossly intact. Skin: Warm and dry, no rashes noted.  Respiratory: Not using accessory muscles, speaking in full sentences, trachea midline.  Cardiovascular: Pulses palpable, no extremity edema. Abdomen: Does not appear distended. MSK:  Right wrist normal-appearing mildly tender distal radius. Normal wrist motion pulses capillary refill sensation intact. Nontender hook of the hamate  Left ankle normal-appearing mildly swollen mildly tender to palpation  lateral ankle. Normal gait.  X-ray left ankle shows good healing awaiting for radiology review    No results found for this or any previous visit (from the past 48 hour(s)). No results found.    Assessment and Plan: 56 y.o. female with  Left ankle fracture doing well. Advance activity as tolerated. Limited return to work. 4 hours per day 3-4 days per week for 2-3 weeks.  Right wrist fracture doing well. This was never very well imaged with plain x-ray. Will treat clinically.  Recheck in 2-3 weeks.    Orders Placed This Encounter  Procedures  . DG Ankle Complete  Left    Standing Status:   Future    Number of Occurrences:   1    Standing Expiration Date:   08/07/2017    Order Specific Question:   Reason for Exam (SYMPTOM  OR DIAGNOSIS REQUIRED)    Answer:   eval fx    Order Specific Question:   Is patient pregnant?    Answer:   No    Order Specific Question:   Preferred imaging location?    Answer:   [facility]    Order Specific Question:   Radiology Contrast Protocol - do NOT remove file path    Answer:   \\[email or web address]    Discussed warning signs or symptoms. Please see discharge instructions. Patient expresses understanding.

## 2016-06-21 ENCOUNTER — Ambulatory Visit (INDEPENDENT_AMBULATORY_CARE_PROVIDER_SITE_OTHER): Payer: BLUE CROSS/BLUE SHIELD | Admitting: Family Medicine

## 2016-06-21 VITALS — BP 128/78 | HR 57 | Wt 149.0 lb

## 2016-06-21 DIAGNOSIS — M542 Cervicalgia: Secondary | ICD-10-CM | POA: Diagnosis not present

## 2016-06-21 DIAGNOSIS — S82832A Other fracture of upper and lower end of left fibula, initial encounter for closed fracture: Secondary | ICD-10-CM | POA: Diagnosis not present
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
## 2016-06-21 NOTE — Patient Instructions (Signed)
Thank you for coming in today. Attend PT.  Increase work to 6 hours per day.  Recheck in 1 month.  Use compression on the ankle while upright.

## 2016-07-19 ENCOUNTER — Other Ambulatory Visit: Payer: Self-pay | Admitting: Family Medicine

## 2016-07-21 ENCOUNTER — Ambulatory Visit (INDEPENDENT_AMBULATORY_CARE_PROVIDER_SITE_OTHER): Payer: BLUE CROSS/BLUE SHIELD | Admitting: Family Medicine

## 2016-07-21 ENCOUNTER — Encounter: Payer: Self-pay | Admitting: Family Medicine

## 2016-07-21 VITALS — BP 124/88 | HR 71 | Wt 149.0 lb

## 2016-07-21 DIAGNOSIS — S82832A Other fracture of upper and lower end of left fibula, initial encounter for closed fracture: Secondary | ICD-10-CM | POA: Diagnosis not present

## 2016-07-21 DIAGNOSIS — S52571A Other intraarticular fracture of lower end of right radius, initial encounter for closed fracture: Secondary | ICD-10-CM

## 2016-07-21 NOTE — Progress Notes (Signed)
Rachel Mack is a 56 y.o. female who presents to [facility] [place] today for follow-up left ankle and right wrist fracture. Patient has done well in the interim and return to work. She does not think she needs any further restrictions. She's noted some continued mild left ankle and right wrist pain. She thinks she has reached maximal medical improvement.   Past Medical History:  Diagnosis Date  . Hypertension   . Hypothyroidism   . Nephrolithiasis    nonobstructive; has never passed one  . Perimenopausal    helped regulate menses  . Vitamin B 12 deficiency    Past Surgical History:  Procedure Laterality Date  . GYNECOLOGIC CRYOSURGERY  2005 or before   cervicitis (Dr. Lissa Hoard in [place])   Social History  Substance Use Topics  . Smoking status: Former Smoker    Quit date: 02/23/2004  . Smokeless tobacco: Never Used  . Alcohol use No   family history includes Breast cancer (age of onset: 91) in her mother; Colon cancer (age of onset: 24) in her paternal grandmother; Diabetes in her mother; Drug abuse in her sister; Hyperlipidemia in her mother; Hypertension in her mother.  ROS as above:  Medications: Current Outpatient Prescriptions  Medication Sig Dispense Refill  . ALPRAZolam (XANAX) 0.25 MG tablet Take 1 tablet (0.25 mg total) by mouth at bedtime as needed. for sleep.  NEED FOLLOW UP VISIT FOR MORE REFILLS 30 tablet 3  . cyanocobalamin (,VITAMIN B-12,) 1000 MCG/ML injection INJECT 1ML (1000MCG TOTAL) INTO THE MUSCLE ONCE AS    DIRECTED DISCARD 28 DAYS   AFTER  FIRST USE 3 mL 2  . DUEXIS 800-26.6 MG TABS Take 1 tablet by mouth 2 (two) times daily.    . fluticasone (FLONASE) 50 MCG/ACT nasal spray One spray in each nostril twice a day, use left hand for right nostril, and right hand for left nostril. 48 g 3  . hydrochlorothiazide (MICROZIDE) 12.5 MG capsule  TAKE 1 CAPSULE EVERY       MORNING 90 capsule 2  . methocarbamol (ROBAXIN) 500 MG tablet Take 500 mg by mouth 4 (four) times daily as needed for muscle spasms.    . Nutritional Supplements (MENOPAUSE FORMULA PO) Take by mouth. [facility]ocare    . phenylephrine (SUDAFED PE) 10 MG TABS tablet Take 1 tablet (10 mg total) by mouth every 8 (eight) hours. 30 tablet 0  . SYNTHROID 50 MCG tablet TAKE 1 TABLET (50 MCG TOTAL) BY MOUTH DAILY. BRAND MEDICALLY NECCESSARY 90 tablet 1   No current facility-administered medications for this visit.    Allergies  Allergen Reactions  . Sulfa Antibiotics   . Amoxicillin Other (See Comments)    yeast infection  . Bupropion Hcl Palpitations    Health Maintenance Health Maintenance  Topic Date Due  . INFLUENZA VACCINE  10/23/2016 (Originally 09/22/2016)  . PAP SMEAR  10/11/2016  . MAMMOGRAM  12/11/2017  . TETANUS/TDAP  12/27/2018  . COLONOSCOPY  06/13/2024  . Hepatitis C Screening  Completed  . HIV Screening  Completed     Exam:  BP 124/88   Pulse 71   Wt 149 lb (67.6 kg)   LMP 04/23/2014   BMI 24.79 kg/m  Gen: Well NAD MSK: Right wrist normal-appearing nontender normal motion. Left ankle slightly swollen nontender normal motion stable ligamentous exam   No results found for this or any previous visit (from the past 72 hour(s)). No results  found.    Assessment and Plan: 56 y.o. female with maximal medical improvement right wrist and left ankle fractures. Return as needed. Return to work full duties.   No orders of the defined types were placed in this encounter.  No orders of the defined types were placed in this encounter.    Discussed warning signs or symptoms. Please see discharge instructions. Patient expresses understanding.

## 2016-07-21 NOTE — Patient Instructions (Signed)
Thank you for coming in today. You will be released to work full duty.  Return as needed.  Get PT to show you how to do the K-tape for the ankle.

## 2016-08-02 ENCOUNTER — Other Ambulatory Visit: Payer: Self-pay | Admitting: Family Medicine

## 2016-08-02 DIAGNOSIS — F5101 Primary insomnia: Secondary | ICD-10-CM

## 2016-08-11 ENCOUNTER — Encounter: Payer: Self-pay | Admitting: Family Medicine

## 2016-08-11 ENCOUNTER — Ambulatory Visit (INDEPENDENT_AMBULATORY_CARE_PROVIDER_SITE_OTHER): Payer: BLUE CROSS/BLUE SHIELD | Admitting: Family Medicine

## 2016-08-11 VITALS — BP 131/77 | HR 72 | Wt 149.0 lb

## 2016-08-11 DIAGNOSIS — M25531 Pain in right wrist: Secondary | ICD-10-CM

## 2016-08-11 NOTE — Patient Instructions (Signed)
Thank you for coming in today. We will schedule an MRI arthrogram of your wrist.  We will discuss the findings then.  Go back into the Exos cast.
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
## 2016-08-12 ENCOUNTER — Telehealth: Payer: Self-pay

## 2016-08-12 NOTE — Telephone Encounter (Signed)
Pt called stating that the MRI will be covered at [facility] fax: [phone] Her insurance requests a copy of [facility] be [facility] at  [phone].

## 2016-08-12 NOTE — Telephone Encounter (Signed)
Pt called stating that her insurance will not cover MRI performed at this location. Pt plans to contact her insurance to find an approved location near [place] where she now lives and let us no so that the order can be sent to that locoation.

## 2016-08-12 NOTE — Telephone Encounter (Signed)
Spoke with [facility], they need copy of order and insurance information. Info faxed over. They will contact Pt to schedule.  Pt advised of current status update.

## 2016-08-17 ENCOUNTER — Ambulatory Visit (INDEPENDENT_AMBULATORY_CARE_PROVIDER_SITE_OTHER): Payer: BLUE CROSS/BLUE SHIELD | Admitting: Family Medicine

## 2016-08-17 ENCOUNTER — Encounter: Payer: Self-pay | Admitting: Family Medicine

## 2016-08-17 VITALS — BP 111/76 | HR 69 | Ht 65.0 in | Wt 149.0 lb

## 2016-08-17 DIAGNOSIS — I10 Essential (primary) hypertension: Secondary | ICD-10-CM | POA: Diagnosis not present

## 2016-08-17 DIAGNOSIS — E038 Other specified hypothyroidism: Secondary | ICD-10-CM | POA: Diagnosis not present

## 2016-08-17 DIAGNOSIS — Z13220 Encounter for screening for lipoid disorders: Secondary | ICD-10-CM | POA: Diagnosis not present

## 2016-08-17 DIAGNOSIS — E559 Vitamin D deficiency, unspecified: Secondary | ICD-10-CM | POA: Diagnosis not present

## 2016-08-17 DIAGNOSIS — I313 Pericardial effusion (noninflammatory): Secondary | ICD-10-CM

## 2016-08-17 DIAGNOSIS — I3139 Other pericardial effusion (noninflammatory): Secondary | ICD-10-CM

## 2016-08-17 DIAGNOSIS — D1724 Benign lipomatous neoplasm of skin and subcutaneous tissue of left leg: Secondary | ICD-10-CM

## 2016-08-17 DIAGNOSIS — H93A9 Pulsatile tinnitus, unspecified ear: Secondary | ICD-10-CM | POA: Diagnosis not present
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
## 2016-08-18 LAB — COMPLETE METABOLIC PANEL WITH GFR
ALK PHOS: 60 U/L (ref 33–130)
ALT: 18 U/L (ref 6–29)
AST: 17 U/L (ref 10–35)
Albumin: 4.9 g/dL (ref 3.6–5.1)
BILIRUBIN TOTAL: 0.7 mg/dL (ref 0.2–1.2)
BUN: 11 mg/dL (ref 7–25)
CO2: 24 mmol/L (ref 20–31)
Calcium: 9.5 mg/dL (ref 8.6–10.4)
Chloride: 103 mmol/L (ref 98–110)
Creat: 0.76 mg/dL (ref 0.50–1.05)
GFR, Est African American: >89 mL/min (ref >=60)
Glucose, Bld: 92 mg/dL (ref 65–99)
Potassium: 4.3 mmol/L (ref 3.5–5.3)
Sodium: 140 mmol/L (ref 135–146)
TOTAL PROTEIN: 7.4 g/dL (ref 6.1–8.1)

## 2016-08-18 LAB — LIPID PANEL W/REFLEX DIRECT LDL
CHOL/HDL RATIO: 4.3 ratio (ref <5.0)
Cholesterol: 186 mg/dL (ref <200)
HDL: 43 mg/dL — AB (ref >50)
LDL-Cholesterol: 110 mg/dL — ABNORMAL HIGH
NON-HDL CHOLESTEROL (CALC): 143 mg/dL — AB (ref <130)
Triglycerides: 211 mg/dL — ABNORMAL HIGH (ref <150)

## 2016-08-18 LAB — VITAMIN D 25 HYDROXY (VIT D DEFICIENCY, FRACTURES): VIT D 25 HYDROXY: 31 ng/mL (ref 30–100)

## 2016-08-18 LAB — TSH: TSH: 1.65 m[IU]/L

## 2016-08-19 ENCOUNTER — Telehealth: Payer: Self-pay | Admitting: Family Medicine

## 2016-08-19 NOTE — Telephone Encounter (Signed)
MRI results of the wrist shows a TFCC tear like I was suspicious for. Please return to clinic to discuss results.

## 2016-08-20 NOTE — Telephone Encounter (Signed)
Called patient gave her results as noted , patient stated that she will call back and schedule a follow up appointment. Fairley Copher,CMA

## 2016-08-23 ENCOUNTER — Other Ambulatory Visit: Payer: Self-pay | Admitting: Family Medicine

## 2016-08-23 ENCOUNTER — Ambulatory Visit (INDEPENDENT_AMBULATORY_CARE_PROVIDER_SITE_OTHER): Payer: BLUE CROSS/BLUE SHIELD | Admitting: Family Medicine

## 2016-08-23 DIAGNOSIS — S6981XA Other specified injuries of right wrist, hand and finger(s), initial encounter: Secondary | ICD-10-CM | POA: Diagnosis not present

## 2016-08-23 DIAGNOSIS — F5101 Primary insomnia: Secondary | ICD-10-CM

## 2016-08-23 HISTORY — DX: Other specified injuries of right wrist, hand and finger(s), initial encounter: S69.81XA
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
## 2016-08-23 NOTE — Telephone Encounter (Signed)
Patient needs her B12 shots called into [facility] and her xanex refilled to [facility] in [place]  Patient did f/U with [facility] June 28th

## 2016-08-23 NOTE — Patient Instructions (Addendum)
Thank you for coming in today. You should hear form a hand surgeon's office soon.  Let me know if you do not hear soon.   Return as needed.   Use the brace as needed.

## 2016-08-24 MED ORDER — CYANOCOBALAMIN 1000 MCG/ML IJ SOLN: INTRAMUSCULAR | 3 refills | Status: DC

## 2016-08-24 MED ORDER — AMBULATORY NON FORMULARY MEDICATION: 0 refills | Status: DC

## 2016-08-24 MED ORDER — ALPRAZOLAM 0.25 MG PO TABS: 0.2500 mg | ORAL_TABLET | Freq: Every evening | ORAL | 3 refills | Status: DC | PRN

## 2016-08-24 NOTE — Telephone Encounter (Signed)
Medications have been sent.Rachel Mack Rachel Mack

## 2016-10-13 ENCOUNTER — Other Ambulatory Visit: Payer: Self-pay | Admitting: Family Medicine

## 2016-10-20 ENCOUNTER — Encounter: Payer: Self-pay | Admitting: Family Medicine

## 2016-10-20 ENCOUNTER — Ambulatory Visit (INDEPENDENT_AMBULATORY_CARE_PROVIDER_SITE_OTHER): Payer: BLUE CROSS/BLUE SHIELD | Admitting: Family Medicine

## 2016-10-20 VITALS — BP 126/77 | HR 86 | Temp 99.1°F | Wt 141.0 lb

## 2016-10-20 DIAGNOSIS — H93A1 Pulsatile tinnitus, right ear: Secondary | ICD-10-CM

## 2016-10-20 DIAGNOSIS — J0100 Acute maxillary sinusitis, unspecified: Secondary | ICD-10-CM

## 2016-10-20 MED ORDER — AZITHROMYCIN 250 MG PO TABS: ORAL_TABLET | ORAL | 0 refills | Status: AC

## 2016-10-20 NOTE — Progress Notes (Signed)
Subjective:    Patient ID: Rachel Mack, female    DOB: 1961/02/02, 56 y.o.   MRN: [id number]  HPI 56 year old comes in today wanting to follow-up on the pulsing in her right ear. We have sent her to ENT and they did a full workup. She feels like at times her ears feel plugged or clogged like she can't hear as well and in no suddenly unplugged. And then she can ar better. She even tried a nasal steroid spray and did not provide relief.  She had a a full hearing workup which was normal. Her biggest bother her is that she is feeling/hearing her pulse particularly in her right ear but occurs in both. It does seem and time with her heartbeat. ENT had recommended that she have carotid Dopplers just to rule out any flow abnormalities that might be causing her to hear her pulse.  She also reports that she's had cold symptoms for about 2 weeks. Particularly nasal congestion, cough, sore throat and headache. She's also had a lot of postnasal drip. She's mostly been using nasal saline sprayno other over-the-counter cough or cold medications.  Review of Systems     BP 126/77   Pulse 86   Temp 99.1 F (37.3 C) (Oral)   Wt 141 lb (64 kg)   LMP 04/23/2014   SpO2 100%   BMI 23.46 kg/m     Allergies  Allergen Reactions  . Sulfa Antibiotics   . Amoxicillin Other (See Comments)    yeast infection  . Bupropion Hcl Palpitations    Past Medical History:  Diagnosis Date  . Hypertension   . Hypothyroidism   . Nephrolithiasis    nonobstructive; has never passed one  . Perimenopausal    helped regulate menses  . Vitamin B 12 deficiency     Past Surgical History:  Procedure Laterality Date  . GYNECOLOGIC CRYOSURGERY  2005 or before   cervicitis (Dr. Lissa Hoard in [place])    Social History   Social History  . Marital status: Married    Spouse name: N/A  . Number of children: 4  . Years of education: N/A   Occupational History  .  [employer]    CNA   Social  History Main Topics  . Smoking status: Former Smoker    Quit date: 02/23/2004  . Smokeless tobacco: Never Used  . Alcohol use No  . Drug use: No  . Sexual activity: Not on file     Comment: Works out regularly, married, 4 sons, youngest .   Other Topics Concern  . Not on file   Social History Narrative   Married, 4 children (32y, 28y, 21y, [place]).   Orig from [place] in [place], currently lives in [place].   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   Cigs: none   Alc: occ.  No drugs.   Exercise: no    Family History  Problem Relation Age of Onset  . Breast cancer Mother 67       d age 108  . Diabetes Mother   . Hyperlipidemia Mother   . Hypertension Mother   . Drug abuse Sister   . Colon cancer Paternal Grandmother 46    Outpatient Encounter Prescriptions as of 10/20/2016  Medication Sig  . ALPRAZolam (XANAX) 0.25 MG tablet Take 1 tablet (0.25 mg total) by mouth at bedtime as needed. for sleep  . AMBULATORY NON FORMULARY MEDICATION Medication Name: 49ml syringe 21g 1.5 in needles  and 18 gauge blunt filler needles for B12 injections monthly disp qs x 6 months  . cyanocobalamin (,VITAMIN B-12,) 1000 MCG/ML injection INJECT 1ML (1000MCG TOTAL) INTO THE MUSCLE ONCE AS    DIRECTED DISCARD 28 DAYS   AFTER  FIRST USE  . DUEXIS 800-26.6 MG TABS Take 1 tablet by mouth 2 (two) times daily.  . hydrochlorothiazide (MICROZIDE) 12.5 MG capsule TAKE 1 CAPSULE EVERY       MORNING  . Nutritional Supplements (MENOPAUSE FORMULA PO) Take by mouth. Menocare  . SYNTHROID 50 MCG tablet TAKE 1 TABLET (50 MCG TOTAL) BY MOUTH DAILY. BRAND MEDICALLY NECCESSARY  . azithromycin (ZITHROMAX) 250 MG tablet 2 Ttabs PO on Day 1, then one a day x 4 days.  . [DISCONTINUED] methocarbamol (ROBAXIN) 500 MG tablet Take 500 mg by mouth 4 (four) times daily as needed for muscle spasms.   No facility-administered encounter medications on file as of 10/20/2016.       Objective:   Physical Exam  Constitutional: She  is oriented to person, place, and time. She appears well-developed and well-nourished.  HENT:  Head: Normocephalic and atraumatic.  Right Ear: External ear normal.  Left Ear: External ear normal.  Nose: Nose normal.  Mouth/Throat: Oropharynx is clear and moist.  TMs and canals are clear.   Eyes: Pupils are equal, round, and reactive to light. Conjunctivae and EOM are normal.  Neck: Neck supple. No thyromegaly present.  Cardiovascular: Normal rate, regular rhythm and normal heart sounds.   No murmur heard. No carotid bruits.  Pulmonary/Chest: Effort normal and breath sounds normal. She has no wheezes.  Lymphadenopathy:    She has no cervical adenopathy.  Neurological: She is alert and oriented to person, place, and time.  Skin: Skin is warm and dry.  Psychiatric: She has a normal mood and affect.          Assessment & Plan:  Acute sinusitis-we'll treat with azithromycin. If not improving over the next week please let me know. She is leaving the country next week. Continue nasal saline spray.  Pulsatile tinnitus in the ears-will evaluate for carotidblockages vs. tortuosity of the carotids.

## 2016-10-20 NOTE — Patient Instructions (Addendum)

## 2016-10-28 ENCOUNTER — Telehealth: Payer: Self-pay | Admitting: *Deleted

## 2016-10-28 NOTE — Telephone Encounter (Signed)
Ok to use Hypertension and pericardial effusion

## 2016-10-28 NOTE — Telephone Encounter (Signed)
Butch Penny with [facility] called and stated that  The Dx code for the [place] carotiid Dr. Madilyn Fireman ordered is not working. (H93.A1 - pulsatile tinnitus of R ear).  Will fwd to pcp for advice.Audelia Hives [place]

## 2016-10-29 ENCOUNTER — Telehealth: Payer: Self-pay

## 2016-10-29 NOTE — Telephone Encounter (Signed)
Pt called to let you know she had her ultrasound done and she has fluid on her heart, it's a moderate amount. Pt is leaving for [place] 9/8.The cardiologist wants her to get an ultrasound done in [place] on Monday to make sure it's not building up. She scheduled an appointment to see you once she's back from [place] she just wanted to give you a heads up of what's going on.

## 2016-10-29 NOTE — Telephone Encounter (Signed)
Spoke w/[name] she stated that upon further review they found what they were looking for and nothing else is needed.Rachel Mack

## 2016-11-01 ENCOUNTER — Telehealth: Payer: Self-pay | Admitting: Family Medicine

## 2016-11-01 NOTE — Telephone Encounter (Signed)
Ok to cancel appt. I agree will see what cardiology recommend. Tell her I hope she has a good trip to [place]. I will look over her report.  Tell her thank you for calling last friday

## 2016-11-01 NOTE — Telephone Encounter (Signed)
Pt called and wants to know if she needs to cancel her appointment next week with you since now after her heart study she needs a referral to [facility]. She states she is fine going here to [facility] in [place] or somewhere in [place]. Please advise patient if she needs to keep her appt next week with you? Also she said her heart study she had Friday showed Fluid around her heart

## 2016-11-02 NOTE — Telephone Encounter (Signed)
I called pt and left a message stating I would cancel her appt with [facility] for next week and that [facility] will refer her to [facility] and look at the report

## 2016-11-03 ENCOUNTER — Telehealth: Payer: Self-pay | Admitting: Family Medicine

## 2016-11-03 DIAGNOSIS — R931 Abnormal findings on diagnostic imaging of heart and coronary circulation: Secondary | ICD-10-CM

## 2016-11-03 DIAGNOSIS — I3139 Other pericardial effusion (noninflammatory): Secondary | ICD-10-CM

## 2016-11-03 DIAGNOSIS — I313 Pericardial effusion (noninflammatory): Secondary | ICD-10-CM

## 2016-11-03 NOTE — Telephone Encounter (Signed)
Pt called and states that she is still waiting on her cardiology referral and would like to get that done as soon as possible

## 2016-11-04 NOTE — Telephone Encounter (Signed)
Referral placed.

## 2016-11-04 NOTE — Telephone Encounter (Signed)
Okay to place referral to cardiology diagnosis pericardial effusion. Please see if they have an appointment available at the [facility] location as they can likely get her in sooner. Can also use diagnosis code abnormal echocardiogram.

## 2016-11-04 NOTE — Addendum Note (Signed)
Addended by: Huel Cote on: 11/04/2016 10:33 AM   Modules accepted: Orders

## 2016-11-09 ENCOUNTER — Ambulatory Visit: Payer: PRIVATE HEALTH INSURANCE | Admitting: Family Medicine

## 2016-11-10 ENCOUNTER — Ambulatory Visit (HOSPITAL_COMMUNITY): Payer: PRIVATE HEALTH INSURANCE

## 2016-11-10 ENCOUNTER — Telehealth: Payer: Self-pay | Admitting: Family Medicine

## 2016-11-10 DIAGNOSIS — M151 Heberden's nodes (with arthropathy): Secondary | ICD-10-CM | POA: Insufficient documentation

## 2016-11-10 DIAGNOSIS — I313 Pericardial effusion (noninflammatory): Secondary | ICD-10-CM

## 2016-11-10 DIAGNOSIS — I4510 Unspecified right bundle-branch block: Secondary | ICD-10-CM | POA: Insufficient documentation

## 2016-11-10 DIAGNOSIS — I3139 Other pericardial effusion (noninflammatory): Secondary | ICD-10-CM

## 2016-11-10 DIAGNOSIS — R5383 Other fatigue: Secondary | ICD-10-CM | POA: Insufficient documentation

## 2016-11-10 DIAGNOSIS — I10 Essential (primary) hypertension: Secondary | ICD-10-CM | POA: Insufficient documentation

## 2016-11-10 DIAGNOSIS — R931 Abnormal findings on diagnostic imaging of heart and coronary circulation: Secondary | ICD-10-CM | POA: Insufficient documentation

## 2016-11-10 NOTE — Telephone Encounter (Signed)
Can we call for clarification. Why is her cardiologist not palcing this referral? This doesn't make sense to me but we cna refer to [facility] if she would like. I don't see a note from them. I would recommend [facility] if she need a pericardiocentesis.

## 2016-11-10 NOTE — Telephone Encounter (Signed)
Pt called and stated that her Cardiologist Dr. Marcello Moores said she needs to get fluid removed from around her heart to be tested and that here pcp needs to put in a referral for it. She stated that it needs to be an Interventional Cardiologist not a surgical Cardiologist and Dr. Marcello Moores stated to try and go through [facility]. Thanks

## 2016-11-11 ENCOUNTER — Other Ambulatory Visit: Payer: Self-pay | Admitting: *Deleted

## 2016-11-11 DIAGNOSIS — I3139 Other pericardial effusion (noninflammatory): Secondary | ICD-10-CM

## 2016-11-11 DIAGNOSIS — M5136 Other intervertebral disc degeneration, lumbar region: Secondary | ICD-10-CM

## 2016-11-11 DIAGNOSIS — I313 Pericardial effusion (noninflammatory): Secondary | ICD-10-CM

## 2016-11-11 NOTE — Telephone Encounter (Signed)
Spoke w/Dr. Marcello Moores and his recommendations were referral to [facility] . Referral placed.Rachel Mack

## 2016-11-12 ENCOUNTER — Telehealth: Payer: Self-pay | Admitting: Family Medicine

## 2016-11-12 ENCOUNTER — Ambulatory Visit: Payer: PRIVATE HEALTH INSURANCE | Admitting: Cardiology

## 2016-11-12 NOTE — Telephone Encounter (Signed)
Rachel Mack with [facility] called on behalf of [facility].  She says Rachel Mack has reached her goal weight - she has lost 10 lbs.and is at a normal BMI, she was given education on bone health and healing. If you have any imput on what you want to focus on you can give Shawn a call at [phone] - ext [phone].  Thank you.

## 2016-11-15 ENCOUNTER — Other Ambulatory Visit: Payer: Self-pay | Admitting: Family Medicine

## 2016-11-15 DIAGNOSIS — Z1239 Encounter for other screening for malignant neoplasm of breast: Secondary | ICD-10-CM

## 2016-11-17 LAB — CBC WITH DIFFERENTIAL/PLATELET
BASOS ABS: 31 {cells}/uL (ref 0–200)
BASOS PCT: 0.5 %
EOS ABS: 143 {cells}/uL (ref 15–500)
EOS PCT: 2.3 %
HCT: 40.5 % (ref 35.0–45.0)
HEMOGLOBIN: 13.9 g/dL (ref 11.7–15.5)
Lymphs Abs: 1817 cells/uL (ref 850–3900)
MCH: 32.7 pg (ref 27.0–33.0)
MCHC: 34.3 g/dL (ref 32.0–36.0)
MCV: 95.3 fL (ref 80.0–100.0)
MONOS PCT: 6.3 %
MPV: 12 fL (ref 7.5–12.5)
NEUTROS ABS: 3819 {cells}/uL (ref 1500–7800)
Neutrophils Relative %: 61.6 %
PLATELETS: 232 10*3/uL (ref 140–400)
RBC: 4.25 10*6/uL (ref 3.80–5.10)
RDW: 12 % (ref 11.0–15.0)
TOTAL LYMPHOCYTE: 29.3 %
WBC mixed population: 391 cells/uL (ref 200–950)
WBC: 6.2 10*3/uL (ref 3.8–10.8)

## 2016-11-17 LAB — LUPUS(12) PANEL
ANA: NEGATIVE
C3 COMPLEMENT: 116 mg/dL (ref 83–193)
C4 Complement: 29 mg/dL (ref 15–57)
ENA SM AB SER-ACNC: NEGATIVE AI
Ribosomal P Protein Ab: <1 AI
SCLERODERMA (SCL-70) (ENA) ANTIBODY, IGG: NEGATIVE AI
SM/RNP: <1 AI
SSA (Ro) (ENA) Antibody, IgG: <1 AI
SSB (LA) (ENA) ANTIBODY, IGG: NEGATIVE AI
THYROID PEROXIDASE ANTIBODY: 1 [IU]/mL (ref <9)
ds DNA Ab: <1 IU/mL

## 2016-11-17 LAB — RHEUMATOID ARTHRITIS DIAGNOSTIC PANEL

## 2016-11-17 NOTE — Progress Notes (Signed)
All labs are normal. 

## 2016-11-20 ENCOUNTER — Other Ambulatory Visit: Payer: Self-pay | Admitting: Family Medicine

## 2016-11-30 ENCOUNTER — Encounter: Payer: Self-pay | Admitting: Family Medicine

## 2016-12-17 ENCOUNTER — Ambulatory Visit (INDEPENDENT_AMBULATORY_CARE_PROVIDER_SITE_OTHER): Payer: BLUE CROSS/BLUE SHIELD

## 2016-12-17 ENCOUNTER — Other Ambulatory Visit: Payer: Self-pay | Admitting: Family Medicine

## 2016-12-17 DIAGNOSIS — Z1239 Encounter for other screening for malignant neoplasm of breast: Secondary | ICD-10-CM

## 2016-12-17 DIAGNOSIS — Z1231 Encounter for screening mammogram for malignant neoplasm of breast: Secondary | ICD-10-CM

## 2017-01-17 ENCOUNTER — Telehealth: Payer: Self-pay

## 2017-01-17 DIAGNOSIS — M5416 Radiculopathy, lumbar region: Secondary | ICD-10-CM

## 2017-01-17 NOTE — Telephone Encounter (Signed)
Lumbar epidural injection ordered and planned. You should hear something soon.  Let me know if you do not hear anything.

## 2017-01-17 NOTE — Telephone Encounter (Signed)
Pt informed of results. Pt expressed understanding and is agreeable. Sundee Garland CMA, RT 

## 2017-01-17 NOTE — Telephone Encounter (Signed)
Pt called and states she needs a referral to get her steroid injection in her Lumbar spine. Pt states it has been a year since the last referral and they need a new one. Please advise?

## 2017-02-27 ENCOUNTER — Other Ambulatory Visit: Payer: Self-pay | Admitting: Family Medicine

## 2017-02-27 DIAGNOSIS — F5101 Primary insomnia: Secondary | ICD-10-CM

## 2017-02-28 ENCOUNTER — Other Ambulatory Visit: Payer: Self-pay | Admitting: *Deleted

## 2017-02-28 ENCOUNTER — Telehealth: Payer: Self-pay | Admitting: Family Medicine

## 2017-02-28 NOTE — Telephone Encounter (Signed)
Can you check on echo that was ordered in June.

## 2017-02-28 NOTE — Telephone Encounter (Signed)
Pt looked up in [facility] [place] refill 01/16/17.Rachel Mack Lynetta Pt due for f/u .Rachel Mack [place]

## 2017-03-01 NOTE — Telephone Encounter (Signed)
Pt has had many done at [facility], please see Care Everywhere. That is where the one you ordered in June was sent for completion. She had another one done in Oct 2018.

## 2017-03-15 ENCOUNTER — Ambulatory Visit
Admission: RE | Admit: 2017-03-15 | Discharge: 2017-03-15 | Disposition: A | Discharge status: Home / Self Care | Payer: BLUE CROSS/BLUE SHIELD | Source: Ambulatory Visit | Attending: Family Medicine | Admitting: Family Medicine

## 2017-03-15 MED ORDER — METHYLPREDNISOLONE ACETATE 40 MG/ML INJ SUSP (RADIOLOG
120.0000 mg | Freq: Once | INTRAMUSCULAR | Status: AC
  Administered 2017-03-15: 120 mg via EPIDURAL

## 2017-03-15 MED ORDER — IOPAMIDOL (ISOVUE-M 200) INJECTION 41%
1.0000 mL | Freq: Once | INTRAMUSCULAR | Status: AC
  Administered 2017-03-15: 1 mL via EPIDURAL

## 2017-03-15 NOTE — Discharge Instructions (Signed)

## 2017-04-08 ENCOUNTER — Other Ambulatory Visit: Payer: Self-pay | Admitting: Family Medicine

## 2017-05-25 ENCOUNTER — Other Ambulatory Visit: Payer: Self-pay | Admitting: Family Medicine

## 2017-05-27 ENCOUNTER — Other Ambulatory Visit (HOSPITAL_COMMUNITY)
Admission: RE | Admit: 2017-05-27 | Discharge: 2017-05-27 | Disposition: A | Discharge status: Home / Self Care | Payer: BLUE CROSS/BLUE SHIELD | Source: Ambulatory Visit | Attending: Family Medicine | Admitting: Family Medicine

## 2017-05-27 ENCOUNTER — Encounter: Payer: Self-pay | Admitting: Family Medicine

## 2017-05-27 ENCOUNTER — Ambulatory Visit (INDEPENDENT_AMBULATORY_CARE_PROVIDER_SITE_OTHER): Payer: BLUE CROSS/BLUE SHIELD | Admitting: Family Medicine

## 2017-05-27 VITALS — BP 111/63 | HR 73 | Ht 65.0 in | Wt 136.0 lb

## 2017-05-27 DIAGNOSIS — Z0000 Encounter for general adult medical examination without abnormal findings: Secondary | ICD-10-CM

## 2017-05-27 DIAGNOSIS — Z124 Encounter for screening for malignant neoplasm of cervix: Secondary | ICD-10-CM

## 2017-05-27 DIAGNOSIS — K649 Unspecified hemorrhoids: Secondary | ICD-10-CM | POA: Diagnosis not present

## 2017-05-27 DIAGNOSIS — E538 Deficiency of other specified B group vitamins: Secondary | ICD-10-CM | POA: Diagnosis not present

## 2017-05-27 MED ORDER — HYDROCORTISONE 1 % RE CREA: 1.0000 "application " | TOPICAL_CREAM | Freq: Two times a day (BID) | RECTAL | 99 refills | Status: DC

## 2017-05-27 MED ORDER — CYANOCOBALAMIN 1000 MCG/ML IJ SOLN: INTRAMUSCULAR | 3 refills | Status: DC

## 2017-05-27 MED ORDER — LIDOCAINE-GLYCERIN 5-14.4 % RE CREA: 1.0000 "application " | TOPICAL_CREAM | Freq: Three times a day (TID) | RECTAL | 99 refills | Status: DC | PRN

## 2017-05-27 NOTE — Progress Notes (Signed)
Subjective:     Rachel Mack is a 57 y.o. female and is here for a comprehensive physical exam. The patient reports no problems. She does report some concerns with internal and external hemorrhoids.  Today which she can try that would be helpful.  Social History   Socioeconomic History  . Marital status: Married    Spouse name: Not on file  . Number of children: 4  . Years of education: Not on file  . 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 Financial resource strain: Not on file  . Food insecurity:    Worry: Not on file    Inability: Not on file  . Transportation needs:    Medical: Not on file    Non-medical: Not on file  Tobacco Use  . Smoking status: Former Smoker    Last attempt to quit: 02/23/2004    Years since quitting: 13.2  . Smokeless tobacco: Never Used  Substance and Sexual Activity  . Alcohol use: No  . Drug use: No  . Sexual activity: Not on file    Comment: Works out regularly, married, 4 sons, youngest .  Lifestyle  . Physical activity:    Days per week: Not on file    Minutes per session: Not on file  . Stress: Not on file  Relationships  . Social connections:    Talks on phone: Not on file    Gets together: Not on file    Attends religious service: Not on file    Active member of club or organization: Not on file    Attends meetings of clubs or organizations: Not on file    Relationship status: Not on file  . Intimate partner violence:    Fear of current or ex partner: Not on file    Emotionally abused: Not on file    Physically abused: Not on file    Forced sexual activity: Not on file  Other Topics Concern  . Not on file  Social History Narrative   Married, 4 children (32y, 28y, 21y, [place]).   Orig from [place] in [place], currently lives in [place].   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   Cigs: none   Alc: occ.  No drugs.   Exercise: no    Health Maintenance  Topic Date Due  . PAP SMEAR  10/11/2016  . INFLUENZA VACCINE  09/22/2017  . MAMMOGRAM  12/18/2018  . TETANUS/TDAP  12/27/2018  . COLONOSCOPY  06/13/2024  . Hepatitis C Screening  Completed  . HIV Screening  Completed    The following portions of the patient's history were reviewed and updated as appropriate: allergies, current medications, past family history, past medical history, past social history, past surgical history and problem list.  Review of Systems A comprehensive review of systems was negative.   Objective:    Ht 5\' 5"  (1.651 m)   LMP 04/23/2014   BMI 23.46 kg/m  General appearance: alert, cooperative and appears stated age Head: Normocephalic, without obvious abnormality, atraumatic Eyes: conj clear. EOMi, PEERLA Ears: normal TM's and external ear canals both ears Nose: Nares normal. Septum midline. Mucosa normal. No drainage or sinus tenderness. Throat: lips, mucosa, and tongue normal; teeth and gums normal Neck: no adenopathy, no carotid bruit, no JVD, supple, symmetrical, trachea midline and thyroid not enlarged, symmetric, no tenderness/mass/nodules Back: symmetric, no curvature. ROM normal. No  CVA tenderness. Lungs: clear to auscultation bilaterally Breasts: normal appearance, no masses or tenderness Heart: regular rate and rhythm, S1, S2 normal, no murmur, click, rub or gallop Abdomen: soft, non-tender; bowel sounds normal; no masses,  no organomegaly Pelvic: cervix normal in appearance, external genitalia normal, no adnexal masses or tenderness, no cervical motion tenderness, rectovaginal septum normal, uterus normal size, shape, and consistency and vagina normal without discharge Extremities: extremities normal, atraumatic, no cyanosis or edema Pulses: 2+ and symmetric Skin: Skin color, texture, turgor normal. No rashes or lesions Lymph nodes: Cervical, supraclavicular, and axillary nodes normal. Neurologic: Alert and oriented X 3,  normal strength and tone. Normal symmetric reflexes. Normal coordination and gait    Assessment:    Healthy female exam.      Plan:     See After Visit Summary for Counseling Recommendations   Keep up a regular exercise program and make sure you are eating a healthy diet Try to eat 4 servings of dairy a day, or if you are lactose intolerant take a calcium with vitamin D daily.  Your vaccines are up to date.   She has been having hemorrhoids internal and external. Wants recommendation for what to try.  We discussed stool regularity to help reduce flares.  We will try a topical steroid and lidocaine gel.  We also discussed that if the flares are very frequent she might even want to consider definitive treatment with referral to a colorectal surgeon for hemorrhoidectomy versus banding. Also recommend sitz bath.    B12 def -due to recheck B12 level.  We will send over refills.

## 2017-05-27 NOTE — Patient Instructions (Signed)

## 2017-05-28 LAB — VITAMIN B12: Vitamin B-12: 1118 pg/mL — ABNORMAL HIGH (ref 200–1100)

## 2017-05-31 ENCOUNTER — Other Ambulatory Visit: Payer: Self-pay | Admitting: *Deleted

## 2017-05-31 DIAGNOSIS — E538 Deficiency of other specified B group vitamins: Secondary | ICD-10-CM

## 2017-05-31 MED ORDER — LIDOCAINE (ANORECTAL) 5 % EX CREA: 1.0000 "application " | TOPICAL_CREAM | Freq: Three times a day (TID) | CUTANEOUS | 0 refills | Status: DC | PRN

## 2017-06-01 LAB — CYTOLOGY - PAP
Diagnosis: NEGATIVE
HPV (WINDOPATH): NOT DETECTED

## 2017-06-01 NOTE — Progress Notes (Signed)
Call patient: Your Pap smear is normal. Repeat in 5 years.

## 2017-06-06 ENCOUNTER — Ambulatory Visit: Payer: BLUE CROSS/BLUE SHIELD | Admitting: Family Medicine

## 2017-06-21 ENCOUNTER — Other Ambulatory Visit: Payer: Self-pay | Admitting: Family Medicine

## 2017-07-23 ENCOUNTER — Other Ambulatory Visit: Payer: Self-pay | Admitting: Family Medicine

## 2017-07-23 ENCOUNTER — Encounter: Payer: Self-pay | Admitting: Family Medicine

## 2017-07-23 DIAGNOSIS — F5101 Primary insomnia: Secondary | ICD-10-CM

## 2017-07-25 ENCOUNTER — Encounter: Payer: Self-pay | Admitting: Family Medicine

## 2017-07-25 MED ORDER — HYDROCHLOROTHIAZIDE 12.5 MG PO CAPS: 12.5000 mg | ORAL_CAPSULE | Freq: Every day | ORAL | 0 refills | Status: DC

## 2017-07-25 MED ORDER — ALPRAZOLAM 0.25 MG PO TABS: 0.2500 mg | ORAL_TABLET | Freq: Every evening | ORAL | 0 refills | Status: DC | PRN

## 2017-08-02 ENCOUNTER — Encounter: Payer: Self-pay | Admitting: Family Medicine

## 2017-08-02 NOTE — Telephone Encounter (Signed)
I thin

## 2017-08-21 ENCOUNTER — Encounter: Payer: Self-pay | Admitting: Family Medicine

## 2017-10-03 ENCOUNTER — Other Ambulatory Visit: Payer: Self-pay | Admitting: Family Medicine

## 2017-10-03 DIAGNOSIS — F5101 Primary insomnia: Secondary | ICD-10-CM

## 2017-10-03 NOTE — Telephone Encounter (Signed)
Pt looked up in [place]. Will fwd to pcp for review. Maryruth Eve, Lahoma Crocker, CMA

## 2017-10-06 ENCOUNTER — Other Ambulatory Visit: Payer: Self-pay | Admitting: Family Medicine

## 2017-10-30 ENCOUNTER — Encounter: Payer: Self-pay | Admitting: Family Medicine

## 2017-10-31 ENCOUNTER — Encounter: Payer: Self-pay | Admitting: Family Medicine

## 2017-11-01 MED ORDER — HYDROCHLOROTHIAZIDE 12.5 MG PO CAPS: 12.5000 mg | ORAL_CAPSULE | Freq: Every day | ORAL | 0 refills | Status: DC

## 2017-11-04 ENCOUNTER — Ambulatory Visit (INDEPENDENT_AMBULATORY_CARE_PROVIDER_SITE_OTHER): Payer: BLUE CROSS/BLUE SHIELD | Admitting: Family Medicine

## 2017-11-04 ENCOUNTER — Encounter: Payer: Self-pay | Admitting: Family Medicine

## 2017-11-04 VITALS — BP 111/64 | HR 73 | Ht 65.0 in | Wt 135.0 lb

## 2017-11-04 DIAGNOSIS — I10 Essential (primary) hypertension: Secondary | ICD-10-CM

## 2017-11-04 DIAGNOSIS — E538 Deficiency of other specified B group vitamins: Secondary | ICD-10-CM

## 2017-11-04 DIAGNOSIS — E038 Other specified hypothyroidism: Secondary | ICD-10-CM | POA: Diagnosis not present

## 2017-11-04 MED ORDER — HYDROCHLOROTHIAZIDE 12.5 MG PO CAPS: 12.5000 mg | ORAL_CAPSULE | Freq: Every day | ORAL | 1 refills | Status: DC

## 2017-11-04 MED ORDER — SYNTHROID 50 MCG PO TABS: ORAL_TABLET | ORAL | 1 refills | Status: DC

## 2017-11-04 NOTE — Progress Notes (Signed)
Subjective:    CC:   HPI: Hypertension- Pt denies chest pain, SOB, dizziness, or heart palpitations.  Taking meds as directed w/o problems.  Denies medication side effects.    B12 deficiency-she was doing her B12 every 4 weeks and her last lab was quite elevated.  We had her D.  So I had her decrease her injections to every 5 weeks and then the plan was to recheck in 3 months.  Also started an exercise program called lady [employer] Soledad Gerlach as well as a diet plan.  She says since doing that and then decreasing the frequency of her B12 injections her leg pain has actually completely gone away.  Hypothyroidism - Taking medication regularly in the AM away from food and vitamins, etc. No recent change to skin, hair, or energy levels.   Past medical history, Surgical history, Family history not pertinant except as noted below, Social history, Allergies, and medications have been entered into the medical record, reviewed, and corrections made.   Review of Systems: No fevers, chills, night sweats, weight loss, chest pain, or shortness of breath.   Objective:    General: Well Developed, well nourished, and in no acute distress.  Neuro: Alert and oriented x3, extra-ocular muscles intact, sensation grossly intact.  HEENT: Normocephalic, atraumatic  Skin: Warm and dry, no rashes. Cardiac: Regular rate and rhythm, no murmurs rubs or gallops, no lower extremity edema.  Respiratory: Clear to auscultation bilaterally. Not using accessory muscles, speaking in full sentences.   Impression and Recommendations:    HTN - Well controlled. Continue current regimen. Follow up in 6 mo.   B12 def -to recheck level.  She is now doing her injections every 5 weeks.  She says the pain in her legs have actually completely resolved.  Hypothyroidism-to recheck TSH.

## 2017-11-05 LAB — COMPLETE METABOLIC PANEL WITH GFR
AG RATIO: 2.1 (calc) (ref 1.0–2.5)
ALBUMIN MSPROF: 4.9 g/dL (ref 3.6–5.1)
ALT: 16 U/L (ref 6–29)
AST: 19 U/L (ref 10–35)
Alkaline phosphatase (APISO): 49 U/L (ref 33–130)
BUN: 13 mg/dL (ref 7–25)
CO2: 34 mmol/L — ABNORMAL HIGH (ref 20–32)
Calcium: 9.9 mg/dL (ref 8.6–10.4)
Chloride: 100 mmol/L (ref 98–110)
Creat: 0.85 mg/dL (ref 0.50–1.05)
GFR, EST AFRICAN AMERICAN: 90 mL/min/{1.73_m2} (ref >=60)
GFR, Est Non African American: 78 mL/min/{1.73_m2} (ref >=60)
GLOBULIN: 2.3 g/dL (ref 1.9–3.7)
Glucose, Bld: 89 mg/dL (ref 65–139)
POTASSIUM: 4 mmol/L (ref 3.5–5.3)
SODIUM: 140 mmol/L (ref 135–146)
Total Bilirubin: 0.5 mg/dL (ref 0.2–1.2)
Total Protein: 7.2 g/dL (ref 6.1–8.1)

## 2017-11-05 LAB — LIPID PANEL
Cholesterol: 148 mg/dL (ref <200)
HDL: 60 mg/dL (ref >50)
LDL Cholesterol (Calc): 66 mg/dL (calc)
NON-HDL CHOLESTEROL (CALC): 88 mg/dL (ref <130)
Total CHOL/HDL Ratio: 2.5 (calc) (ref <5.0)
Triglycerides: 132 mg/dL (ref <150)

## 2017-11-05 LAB — TSH: TSH: 1.3 m[IU]/L

## 2017-11-05 LAB — VITAMIN B12: VITAMIN B 12: 1201 pg/mL — AB (ref 200–1100)

## 2017-11-06 ENCOUNTER — Encounter: Payer: Self-pay | Admitting: Family Medicine

## 2017-11-16 ENCOUNTER — Encounter: Payer: Self-pay | Admitting: Family Medicine

## 2017-11-18 ENCOUNTER — Encounter: Payer: Self-pay | Admitting: Family Medicine

## 2017-11-18 ENCOUNTER — Other Ambulatory Visit: Payer: Self-pay | Admitting: Family Medicine

## 2017-11-18 ENCOUNTER — Ambulatory Visit: Payer: BLUE CROSS/BLUE SHIELD | Admitting: Family Medicine

## 2017-11-18 DIAGNOSIS — F5101 Primary insomnia: Secondary | ICD-10-CM

## 2017-11-18 NOTE — Telephone Encounter (Signed)
Pt looked up in [facility] data base. Last refill was 10/03/17 #30. Also pt sent a my chart message asking for a 3 month supply. Will route to pcp for review and signature.Rachel Mack, [place]

## 2017-12-01 ENCOUNTER — Other Ambulatory Visit: Payer: Self-pay | Admitting: Family Medicine

## 2017-12-01 DIAGNOSIS — Z1231 Encounter for screening mammogram for malignant neoplasm of breast: Secondary | ICD-10-CM

## 2017-12-23 ENCOUNTER — Encounter: Payer: Self-pay | Admitting: Family Medicine

## 2017-12-30 ENCOUNTER — Ambulatory Visit (INDEPENDENT_AMBULATORY_CARE_PROVIDER_SITE_OTHER): Payer: BLUE CROSS/BLUE SHIELD

## 2017-12-30 DIAGNOSIS — Z1231 Encounter for screening mammogram for malignant neoplasm of breast: Secondary | ICD-10-CM | POA: Diagnosis not present

## 2018-01-02 ENCOUNTER — Encounter: Payer: Self-pay | Admitting: Family Medicine

## 2018-01-17 ENCOUNTER — Other Ambulatory Visit: Payer: Self-pay | Admitting: Family Medicine
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
## 2018-05-14 ENCOUNTER — Encounter: Payer: Self-pay | Admitting: Family Medicine

## 2018-05-15 ENCOUNTER — Encounter: Payer: Self-pay | Admitting: Family Medicine

## 2018-05-15 MED ORDER — PERMETHRIN 5 % EX CREA: 1.0000 "application " | TOPICAL_CREAM | Freq: Once | CUTANEOUS | 0 refills | Status: AC

## 2018-05-17 ENCOUNTER — Encounter: Payer: Self-pay | Admitting: Family Medicine

## 2018-05-19 ENCOUNTER — Other Ambulatory Visit: Payer: Self-pay | Admitting: Family Medicine

## 2018-05-19 ENCOUNTER — Encounter: Payer: Self-pay | Admitting: Family Medicine

## 2018-05-19 MED ORDER — HYDROCHLOROTHIAZIDE 12.5 MG PO CAPS: 12.5000 mg | ORAL_CAPSULE | Freq: Every day | ORAL | 0 refills | Status: DC

## 2018-05-21 ENCOUNTER — Encounter: Payer: Self-pay | Admitting: Family Medicine

## 2018-05-22 ENCOUNTER — Encounter: Payer: Self-pay | Admitting: Family Medicine

## 2018-05-22 ENCOUNTER — Ambulatory Visit (INDEPENDENT_AMBULATORY_CARE_PROVIDER_SITE_OTHER): Payer: BLUE CROSS/BLUE SHIELD | Admitting: Family Medicine

## 2018-05-22 VITALS — BP 106/70 | HR 73 | Wt 134.0 lb

## 2018-05-22 DIAGNOSIS — E038 Other specified hypothyroidism: Secondary | ICD-10-CM

## 2018-05-22 DIAGNOSIS — I10 Essential (primary) hypertension: Secondary | ICD-10-CM

## 2018-05-22 DIAGNOSIS — R252 Cramp and spasm: Secondary | ICD-10-CM

## 2018-05-22 DIAGNOSIS — F5101 Primary insomnia: Secondary | ICD-10-CM | POA: Diagnosis not present

## 2018-05-22 MED ORDER — ALPRAZOLAM 0.25 MG PO TABS: 0.2500 mg | ORAL_TABLET | Freq: Every evening | ORAL | 3 refills | Status: DC | PRN

## 2018-05-22 MED ORDER — SYNTHROID 50 MCG PO TABS: ORAL_TABLET | ORAL | 0 refills | Status: DC

## 2018-05-22 NOTE — Progress Notes (Signed)
Doing well on current regimen. She asked if we could request that she get the pharmacy to dispense the Xanax in the "football shape" these are easier for her to cut in half and they don't crumble and fall apart. I told her that I would put this in the comments.  She is doing her B12 injections every 5 weeks.Rachel Mack, Lahoma Crocker, CMA

## 2018-05-22 NOTE — Progress Notes (Signed)
Virtual Visit via Video Note  I connected with Rachel Mack on 05/22/18 at  2:00 PM EDT by a video enabled telemedicine application and verified that I am speaking with the correct person using two identifiers.   I discussed the limitations of evaluation and management by telemedicine and the availability of in person appointments. The patient expressed understanding and agreed to proceed.  Subjective:    CC: BP.   HPI:  Hypertension- Pt denies chest pain, SOB, dizziness, or heart palpitations.  Taking meds as directed w/o problems.  Denies medication side effects.    Follow-up mild depression-she is not currently on medication but does use Xanax as needed particularly for sleep. Usually split the tabs.    Hypothyroidism - Taking medication regularly in the AM away from food and vitamins, etc. No recent change to skin, hair, or energy levels.  Magnesium has really helped her leg cramping.    Past medical history, Surgical history, Family history not pertinant except as noted below, Social history, Allergies, and medications have been entered into the medical record, reviewed, and corrections made.   Review of Systems: No fevers, chills, night sweats, weight loss, chest pain, or shortness of breath.   Objective:    General: Speaking clearly in complete sentences without any shortness of breath.  Alert and oriented x3.  Normal judgment. No apparent acute distress.    Impression and Recommendations:    HTN - Home BP looks great. Due for labwork.    Hypothyroidism- plan to recheck level in May.  Refilled Synthroid as well.  Depression- doing well.  PHQ 2 score of 2.   Insomnia  Did refill her alprazolam.  We did put a notation that she prefers the oblong ones.  She says when she tries to split the round tabs they crumble.  And a lot of times she does only use a half a tab for sleep as needed.  Leg cramps - much improved on magnesium.     I discussed the assessment and  treatment plan with the patient. The patient was provided an opportunity to ask questions and all were answered. The patient agreed with the plan and demonstrated an understanding of the instructions.   The patient was advised to call back or seek an in-person evaluation if the symptoms worsen or if the condition fails to improve as anticipated.   Beatrice Lecher, MD

## 2018-05-29 ENCOUNTER — Encounter: Payer: BLUE CROSS/BLUE SHIELD | Admitting: Family Medicine

## 2018-06-06 ENCOUNTER — Telehealth (INDEPENDENT_AMBULATORY_CARE_PROVIDER_SITE_OTHER): Payer: BLUE CROSS/BLUE SHIELD | Admitting: Family Medicine

## 2018-06-06 ENCOUNTER — Encounter: Payer: Self-pay | Admitting: Family Medicine

## 2018-06-06 ENCOUNTER — Other Ambulatory Visit: Payer: Self-pay

## 2018-06-06 VITALS — BP 123/80 | HR 90 | Temp 98.1°F | Ht 65.0 in | Wt 134.0 lb

## 2018-06-06 DIAGNOSIS — J019 Acute sinusitis, unspecified: Secondary | ICD-10-CM | POA: Diagnosis not present

## 2018-06-06 MED ORDER — AZITHROMYCIN 250 MG PO TABS: ORAL_TABLET | ORAL | 0 refills | Status: AC

## 2018-06-06 NOTE — Telephone Encounter (Signed)
Patient scheduled.

## 2018-06-06 NOTE — Progress Notes (Signed)
Virtual Visit via Video Note  I connected with Rachel Mack on 06/06/18 at  9:50 AM EDT by a video enabled telemedicine application and verified that I am speaking with the correct person using two identifiers.   I discussed the limitations of evaluation and management by telemedicine and the availability of in person appointments. The patient expressed understanding and agreed to proceed.  Subjective:    CC: cough  HPI: Stared with sinus congestion and now in her chest.  She doesn't fel SOB.  stopped up noise that's moved into my throat and chest coughing up brown phlegm but no fever. Took Mucinex this morning. ST on an off. It burns.  No pin or pressure in  Her face. No GI sxs.  Now her voice is raspy.  No wheezing.  She has used her flonase a few times.    Past medical history, Surgical history, Family history not pertinant except as noted below, Social history, Allergies, and medications have been entered into the medical record, reviewed, and corrections made.      Review of Systems: No fevers, chills, night sweats, weight loss, chest pain, or shortness of breath.   Objective:    General: Speaking clearly in complete sentences without any shortness of breath.  Alert and oriented x3.  Normal judgment. No apparent acute distress. She appear well groomed.     Impression and Recommendations:     Acute sinusitis with bronchitis - will treat with azithromycin.recommend nasal saline irrigation.  Use her flonase.  Call if not better in one week. Work note entered into [employer] for today and tomorrow. She works at nursing home. I don't think she has COVID.       I discussed the assessment and treatment plan with the patient. The patient was provided an opportunity to ask questions and all were answered. The patient agreed with the plan and demonstrated an understanding of the instructions.   The patient was advised to call back or seek an in-person evaluation if the symptoms worsen  or if the condition fails to improve as anticipated.   Beatrice Lecher, MD

## 2018-06-06 NOTE — Progress Notes (Signed)
Pt reports she noticed about 1 wk ago. Got worse last night. Denies f/s/c/n/v/d. She has been taking mucinex she said that she took that at 4 AM. This was the only OTC medication that she has tried. She stated that the mucous is brown in color.Elouise Munroe, [place]

## 2018-06-08 ENCOUNTER — Encounter: Payer: Self-pay | Admitting: Family Medicine

## 2018-06-09 ENCOUNTER — Encounter: Payer: Self-pay | Admitting: Family Medicine

## 2018-06-09 ENCOUNTER — Telehealth: Payer: Self-pay | Admitting: Family Medicine

## 2018-06-09 NOTE — Telephone Encounter (Signed)
Work note written and faxed to [phone] with confirmation received. Patient aware.

## 2018-06-09 NOTE — Telephone Encounter (Signed)
To PCP

## 2018-06-09 NOTE — Telephone Encounter (Signed)
PT requested a Flonase prescription to be sent in.   Also needs a work not extension to 06/10/18  Please Advise.

## 2018-06-12 ENCOUNTER — Encounter: Payer: Self-pay | Admitting: Family Medicine

## 2018-06-12 MED ORDER — FLUTICASONE PROPIONATE 50 MCG/ACT NA SUSP: 1.0000 | Freq: Every day | NASAL | 12 refills | Status: DC

## 2018-06-12 MED ORDER — FLUCONAZOLE 150 MG PO TABS: 150.0000 mg | ORAL_TABLET | Freq: Once | ORAL | 1 refills | Status: AC

## 2018-06-12 MED ORDER — AMOXICILLIN-POT CLAVULANATE 875-125 MG PO TABS: 1.0000 | ORAL_TABLET | Freq: Two times a day (BID) | ORAL | 0 refills | Status: DC

## 2018-06-26 ENCOUNTER — Encounter: Payer: BLUE CROSS/BLUE SHIELD | Admitting: Family Medicine

## 2018-07-06 ENCOUNTER — Encounter: Payer: Self-pay | Admitting: Family Medicine

## 2018-07-28 ENCOUNTER — Encounter: Payer: Self-pay | Admitting: Family Medicine

## 2018-08-13 ENCOUNTER — Other Ambulatory Visit: Payer: Self-pay | Admitting: Family Medicine

## 2018-08-14 ENCOUNTER — Encounter: Payer: Self-pay | Admitting: Family Medicine

## 2018-08-18 ENCOUNTER — Encounter: Payer: Self-pay | Admitting: Family Medicine

## 2018-08-18 ENCOUNTER — Ambulatory Visit (INDEPENDENT_AMBULATORY_CARE_PROVIDER_SITE_OTHER): Payer: BC Managed Care – PPO | Admitting: Family Medicine

## 2018-08-18 VITALS — BP 116/71 | HR 59 | Ht 65.0 in | Wt 134.0 lb

## 2018-08-18 DIAGNOSIS — Z0000 Encounter for general adult medical examination without abnormal findings: Secondary | ICD-10-CM

## 2018-08-18 DIAGNOSIS — E538 Deficiency of other specified B group vitamins: Secondary | ICD-10-CM

## 2018-08-18 DIAGNOSIS — R252 Cramp and spasm: Secondary | ICD-10-CM

## 2018-08-18 NOTE — Progress Notes (Signed)
Subjective:     Rachel Mack is a 58 y.o. female and is here for a comprehensive physical exam. The patient reports no problems.  She is exercising on treadmill 25 min per day.  She is doingt well. Some increased stress with COVID but she feels she is doing well.   Social History   Socioeconomic History  . Marital status: Married    Spouse name: Not on file  . Number of children: 4  . Years of education: Not on file  . 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 Financial resource strain: Not on file  . Food insecurity    Worry: Not on file    Inability: Not on file  . Transportation needs    Medical: Not on file    Non-medical: Not on file  Tobacco Use  . Smoking status: Former Smoker    Quit date: 02/23/2004    Years since quitting: 14.4  . Smokeless tobacco: Never Used  Substance and Sexual Activity  . Alcohol use: No  . Drug use: No  . Sexual activity: Not on file    Comment: Works out regularly, married, 4 sons, youngest .  Lifestyle  . Physical activity    Days per week: Not on file    Minutes per session: Not on file  . Stress: Not on file  Relationships  . Social Herbalist on phone: Not on file    Gets together: Not on file    Attends religious service: Not on file    Active member of club or organization: Not on file    Attends meetings of clubs or organizations: Not on file    Relationship status: Not on file  . Intimate partner violence    Fear of current or ex partner: Not on file    Emotionally abused: Not on file    Physically abused: Not on file    Forced sexual activity: Not on file  Other Topics Concern  . Not on file  Social History Narrative   Married, 4 children (32y, 28y, 21y, [place]).   Orig from [place] in [place], currently lives in [place].   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   Cigs: none   Alc: occ.  No drugs.   Exercise: no    Health Maintenance  Topic Date Due  . INFLUENZA VACCINE  05/23/2019 (Originally 09/23/2018)  . TETANUS/TDAP  12/27/2018  . MAMMOGRAM  12/31/2019  . PAP SMEAR-Modifier  05/27/2020  . COLONOSCOPY  06/13/2024  . Hepatitis C Screening  Completed  . HIV Screening  Completed    The following portions of the patient's history were reviewed and updated as appropriate: allergies, current medications, past family history, past medical history, past social history, past surgical history and problem list.  Review of Systems A comprehensive review of systems was negative.   Objective:    BP 116/71   Pulse (!) 59   Ht 5\' 5"  (1.651 m)   Wt 134 lb (60.8 kg)   LMP 04/23/2014   SpO2 100%   BMI 22.30 kg/m  General appearance: alert, cooperative and appears stated age Head: Normocephalic, without obvious abnormality, atraumatic Eyes: conj clear, EOMI, PEERLA Ears: normal TM's and external ear canals both ears Nose: Nares normal. Septum midline. Mucosa normal. No drainage or sinus tenderness. Throat: lips, mucosa, and tongue normal; teeth and  gums normal Neck: no adenopathy, no carotid bruit, no JVD, supple, symmetrical, trachea midline and thyroid not enlarged, symmetric, no tenderness/mass/nodules Back: symmetric, no curvature. ROM normal. No CVA tenderness. Lungs: clear to auscultation bilaterally Breasts: normal appearance, no masses or tenderness, right nipple is retracted Heart: regular rate and rhythm, S1, S2 normal, no murmur, click, rub or gallop Abdomen: soft, non-tender; bowel sounds normal; no masses,  no organomegaly Extremities: extremities normal, atraumatic, no cyanosis or edema Pulses: 2+ and symmetric Skin: Skin color, texture, turgor normal. No rashes or lesions Lymph nodes: Cervical, supraclavicular, and axillary nodes normal. Neurologic: Alert and oriented X 3, normal strength and tone. Normal symmetric reflexes. Normal coordination and gait    Assessment:    Healthy  female exam.      Plan:     See After Visit Summary for Counseling Recommendations   Keep up a regular exercise program and make sure you are eating a healthy diet Try to eat 4 servings of dairy a day, or if you are lactose intolerant take a calcium with vitamin D daily.  Your vaccines are up to date.  Colonoscopy is up to date.   Vaccines are up to date.  Discussed shingles vaccine.  Due for Tdap later this year.

## 2018-08-18 NOTE — Patient Instructions (Signed)

## 2018-08-19 ENCOUNTER — Other Ambulatory Visit: Payer: Self-pay | Admitting: Family Medicine

## 2018-08-19 LAB — CBC
HCT: 43 % (ref 35.0–45.0)
Hemoglobin: 14.7 g/dL (ref 11.7–15.5)
MCH: 32.7 pg (ref 27.0–33.0)
MCHC: 34.2 g/dL (ref 32.0–36.0)
MCV: 95.8 fL (ref 80.0–100.0)
MPV: 11.5 fL (ref 7.5–12.5)
Platelets: 241 10*3/uL (ref 140–400)
RBC: 4.49 10*6/uL (ref 3.80–5.10)
RDW: 12.1 % (ref 11.0–15.0)
WBC: 6 10*3/uL (ref 3.8–10.8)

## 2018-08-19 LAB — COMPLETE METABOLIC PANEL WITH GFR
AG Ratio: 2.3 (calc) (ref 1.0–2.5)
ALT: 19 U/L (ref 6–29)
AST: 18 U/L (ref 10–35)
Albumin: 5.3 g/dL — ABNORMAL HIGH (ref 3.6–5.1)
Alkaline phosphatase (APISO): 43 U/L (ref 37–153)
BUN: 12 mg/dL (ref 7–25)
CO2: 32 mmol/L (ref 20–32)
Calcium: 10.1 mg/dL (ref 8.6–10.4)
Chloride: 100 mmol/L (ref 98–110)
Creat: 0.63 mg/dL (ref 0.50–1.05)
GFR, Est African American: 117 mL/min/{1.73_m2} (ref >=60)
GFR, Est Non African American: 101 mL/min/{1.73_m2} (ref >=60)
Globulin: 2.3 g/dL (calc) (ref 1.9–3.7)
Glucose, Bld: 91 mg/dL (ref 65–99)
Potassium: 3.8 mmol/L (ref 3.5–5.3)
Sodium: 139 mmol/L (ref 135–146)
Total Bilirubin: 0.7 mg/dL (ref 0.2–1.2)
Total Protein: 7.6 g/dL (ref 6.1–8.1)

## 2018-08-19 LAB — LIPID PANEL
Cholesterol: 188 mg/dL (ref <200)
HDL: 73 mg/dL (ref >=50)
LDL Cholesterol (Calc): 98 mg/dL (calc)
Non-HDL Cholesterol (Calc): 115 mg/dL (calc) (ref <130)
Total CHOL/HDL Ratio: 2.6 (calc) (ref <5.0)
Triglycerides: 79 mg/dL (ref <150)

## 2018-08-19 LAB — TSH: TSH: 2.13 mIU/L

## 2018-08-19 LAB — MAGNESIUM: Magnesium: 2.2 mg/dL (ref 1.5–2.5)

## 2018-08-19 LAB — VITAMIN B12: Vitamin B-12: 971 pg/mL (ref 200–1100)

## 2018-08-21 ENCOUNTER — Encounter: Payer: Self-pay | Admitting: Family Medicine

## 2018-11-11 ENCOUNTER — Other Ambulatory Visit: Payer: Self-pay | Admitting: Family Medicine

## 2018-11-11 DIAGNOSIS — F5101 Primary insomnia: Secondary | ICD-10-CM

## 2018-11-17 ENCOUNTER — Ambulatory Visit: Payer: BC Managed Care – PPO | Admitting: Family Medicine

## 2018-11-20 ENCOUNTER — Ambulatory Visit: Payer: BC Managed Care – PPO | Admitting: Family Medicine

## 2018-11-21 ENCOUNTER — Other Ambulatory Visit: Payer: Self-pay | Admitting: Family Medicine

## 2018-11-21 DIAGNOSIS — Z1231 Encounter for screening mammogram for malignant neoplasm of breast: Secondary | ICD-10-CM

## 2018-11-24 ENCOUNTER — Other Ambulatory Visit: Payer: Self-pay

## 2018-11-24 ENCOUNTER — Encounter: Payer: Self-pay | Admitting: Family Medicine

## 2018-11-24 ENCOUNTER — Ambulatory Visit (INDEPENDENT_AMBULATORY_CARE_PROVIDER_SITE_OTHER): Payer: BC Managed Care – PPO | Admitting: Family Medicine

## 2018-11-24 VITALS — BP 112/79 | HR 61 | Temp 98.0°F | Wt 136.0 lb

## 2018-11-24 DIAGNOSIS — M542 Cervicalgia: Secondary | ICD-10-CM

## 2018-11-24 NOTE — Progress Notes (Signed)
Rachel Mack is a 58 y.o. female who presents to [place] today for left neck pain x 2 weeks.   Patient has about 2-week history of left-sided neck pain.  Pain is predominantly in the lateral neck and base of the skull.  Pain is worse with activity and better with rest.  She had one episode of pain radiating down her arm that has not repeated.  She denies any weakness or numbness.  She is tried Tylenol ibuprofen heat and massage which helped a little.  She denies any injury.  No fevers or chills.    ROS:  As above  Exam:  BP 112/79   Pulse 61   Temp 98 F (36.7 C) (Oral)   Wt 136 lb (61.7 kg)   LMP 04/23/2014   BMI 22.63 kg/m  Wt Readings from Last 5 Encounters:  11/24/18 136 lb (61.7 kg)  08/18/18 134 lb (60.8 kg)  06/06/18 134 lb (60.8 kg)  05/22/18 134 lb (60.8 kg)  11/04/17 135 lb (61.2 kg)   General: Well Developed, well nourished, and in no acute distress.  Neuro/Psych: Alert and oriented x3, extra-ocular muscles intact, able to move all 4 extremities, sensation grossly intact. Skin: Warm and dry, no rashes noted.  Respiratory: Not using accessory muscles, speaking in full sentences, trachea midline.  Cardiovascular: Pulses palpable, no extremity edema. Abdomen: Does not appear distended. MSK: C-spine: Normal appearing. Nontender to spinal midline. Tender palpation left cervical paraspinal musculature and trapezius. Normal cervical motion however pain with right rotation. Upper extremity strength reflexes and sensation are equal and normal throughout.       Assessment and Plan: 58 y.o. female with left-sided neck pain.  Likely myofascial spasm and pain and dysfunction.  Plan for referral to physical therapy.  Use heating pad TENS unit and over-the-counter medications as needed.  Patient declined muscle relaxers.  Recheck if not improving.   PDMP not reviewed this encounter. Orders Placed This Encounter   Procedures  . Ambulatory referral to Physical Therapy    Referral Priority:   Routine    Referral Type:   Physical Medicine    Referral Reason:   Specialty Services Required    Requested Specialty:   Physical Therapy    Number of Visits Requested:   1   No orders of the defined types were placed in this encounter.   Historical information moved to improve visibility of documentation.  Past Medical History:  Diagnosis Date  . B12 DEFICIENCY 12/26/2008   Qualifier: Diagnosis of  By: Esmeralda Arthur    . Closed fracture of left distal fibula 04/26/2016  . Closed fracture of right distal radius 04/26/2016  . DEPRESSION, MILD 04/25/2008   Qualifier: Diagnosis of  By: Esmeralda Arthur    . Displaced fracture of hook process of hamate (unciform) bone, right wrist, initial encounter for closed fracture 04/26/2016  . Essential hypertension, benign 09/16/2010  . Eustachian tube dysfunction, bilateral 11/27/2015  . Hematuria of undiagnosed cause 01/26/2011   Overview:  ICD-10 cut over   . Hypertension   . Hypothyroidism   . Insomnia 11/01/2011  . Inverted nipple 02/06/2013  . IRREGULAR MENSES 07/31/2008   Qualifier: Diagnosis of  By: Esmeralda Arthur    . 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 Nephrolithiasis    nonobstructive; has never passed one  . Osteopenia 04/18/2015   Percell Miller 2017-T score of -1.6.   Marland Kitchen Pericardial effusion 01/08/2011  .  Perimenopausal    helped regulate menses  . PERIMENOPAUSAL STATUS 04/25/2008   Qualifier: Diagnosis of  By: Esmeralda Arthur    . TFCC (triangular fibrocartilage complex) injury, right, initial encounter 08/23/2016  . Vitamin B 12 deficiency    Past Surgical History:  Procedure Laterality Date  . GYNECOLOGIC CRYOSURGERY  2005 or before   cervicitis (Dr. Lissa Hoard in [place])   Social History   Tobacco Use  . Smoking status: Former Smoker    Quit date: 02/23/2004    Years since quitting: 14.7  . Smokeless tobacco: Never Used   Substance Use Topics  . Alcohol use: No   family history includes Breast cancer (age of onset: 36) in her mother; Colon cancer (age of onset: 56) in her paternal grandmother; Diabetes in her mother; Drug abuse in her sister; Hyperlipidemia in her mother; Hypertension in her mother.  Medications: Current Outpatient Medications  Medication Sig Dispense Refill  . ALPRAZolam (XANAX) 0.25 MG tablet TAKE 1 TABLET (0.25 MG TOTAL) BY MOUTH AT BEDTIME AS NEEDED FOR SLEEP. FOR SLEEP 30 tablet 3  . AMBULATORY NON FORMULARY MEDICATION Medication Name: 82ml syringe 21g 1.5 in needles and 18 gauge blunt filler needles for B12 injections monthly disp qs x 6 months 1 each 0  . Biotin 1 MG CAPS Take 1 mg by mouth.    . cyanocobalamin (,VITAMIN B-12,) 1000 MCG/ML injection INJECT 1ML (1000MCG)       INTRAMUSCULARLY ONCE AS    DIRECTED. DISCARD 28 DAYS  AFTER FIRST USE 3 mL 3  . fluticasone (FLONASE) 50 MCG/ACT nasal spray Place 1-2 sprays into both nostrils daily. 16 g 12  . hydrochlorothiazide (MICROZIDE) 12.5 MG capsule TAKE 1 CAPSULE DAILY 90 capsule 0  . MAGNESIUM PO Take by mouth.    . Menaquinone-7 (VITAMIN K2 PO) Take 1 tablet by mouth.    . Misc Natural Products (SUPER-D3+) 5000 units CAPS Take by mouth.    . SYNTHROID 50 MCG tablet TAKE 1 TABLET BY MOUTH EVERY DAY 90 tablet 0   No current facility-administered medications for this visit.    Allergies  Allergen Reactions  . Sulfa Antibiotics   . Amoxicillin Other (See Comments)    yeast infection  . Bupropion Hcl Palpitations      Discussed warning signs or symptoms. Please see discharge instructions. Patient expresses understanding.

## 2018-11-24 NOTE — Patient Instructions (Signed)
Thank you for coming in today. Attend PT Use heating pad and TENS unit.  Recheck if not better.  Come back or go to the emergency room if you notice new weakness new numbness problems walking or bowel or bladder problems.

## 2018-12-11 DIAGNOSIS — H9313 Tinnitus, bilateral: Secondary | ICD-10-CM | POA: Insufficient documentation

## 2018-12-11 DIAGNOSIS — F419 Anxiety disorder, unspecified: Secondary | ICD-10-CM | POA: Insufficient documentation

## 2019-01-03 ENCOUNTER — Ambulatory Visit: Payer: BC Managed Care – PPO

## 2019-01-08 ENCOUNTER — Other Ambulatory Visit: Payer: Self-pay | Admitting: Family Medicine

## 2019-01-08 LAB — HM MAMMOGRAPHY

## 2019-02-06 ENCOUNTER — Encounter: Payer: Self-pay | Admitting: Family Medicine

## 2019-02-14 ENCOUNTER — Encounter: Payer: Self-pay | Admitting: Family Medicine

## 2019-02-15 ENCOUNTER — Encounter: Payer: Self-pay | Admitting: Family Medicine

## 2019-02-19 NOTE — Telephone Encounter (Signed)
Yes we do Covid antibody testing through [facility]

## 2019-02-20 ENCOUNTER — Encounter: Payer: Self-pay | Admitting: Family Medicine

## 2019-02-26 ENCOUNTER — Telehealth: Payer: Self-pay

## 2019-02-26 NOTE — Telephone Encounter (Signed)
Copied from [place] [id number]. Topic: General - Other >> Feb 26, 2019 10:00 AM Rachel Mack wrote: Reason for CRM: Pt called and is needing a referral for a sports medicine doctor for a shot for her back. Please advise.

## 2019-02-27 ENCOUNTER — Ambulatory Visit: Payer: BC Managed Care – PPO | Admitting: Sports Medicine

## 2019-03-02 ENCOUNTER — Ambulatory Visit: Payer: BC Managed Care – PPO | Admitting: Sports Medicine

## 2019-03-04 ENCOUNTER — Other Ambulatory Visit: Payer: Self-pay | Admitting: Family Medicine

## 2019-03-08 ENCOUNTER — Ambulatory Visit (INDEPENDENT_AMBULATORY_CARE_PROVIDER_SITE_OTHER): Payer: BC Managed Care – PPO | Admitting: Family Medicine

## 2019-03-08 ENCOUNTER — Encounter: Payer: Self-pay | Admitting: Family Medicine

## 2019-03-08 VITALS — BP 133/92 | HR 82 | Temp 98.1°F | Resp 18

## 2019-03-08 DIAGNOSIS — J069 Acute upper respiratory infection, unspecified: Secondary | ICD-10-CM | POA: Diagnosis not present

## 2019-03-08 DIAGNOSIS — Z20822 Contact with and (suspected) exposure to covid-19: Secondary | ICD-10-CM | POA: Diagnosis not present

## 2019-03-08 MED ORDER — AZITHROMYCIN 250 MG PO TABS: ORAL_TABLET | ORAL | 0 refills | Status: AC

## 2019-03-08 NOTE — Progress Notes (Signed)
sxs x 2 days. She has a stuffy nose, some chest tightness, post nasal drip.  She denies f/s/c/n/v/d,SOB,cough.no loss of taste/smell,headache,or body aches.  She has only taken emergen-c and nasal spray.

## 2019-03-08 NOTE — Progress Notes (Signed)
Virtual Visit via Video Note  I connected with Rachel Mack on 03/08/19 at  1:40 PM EST by a video enabled telemedicine application and verified that I am speaking with the correct person using two identifiers.   I discussed the limitations of evaluation and management by telemedicine and the availability of in person appointments. The patient expressed understanding and agreed to proceed.  Subjective:    CC: Sick x 2 days.   HPI: sxs x 2 days. She has had nasal congestion, some chest tightness, post nasal drip. She denies f/s/c/n/v/d,SOB,cough.no loss of taste/smell,headache,or body aches.  She did have some loose stools for couple days but that actually seems to be a little bit better.  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  The chest tightness is mostly at the top of her chest.  He says she was tested for Covid yesterday with a rapid test and tested negative.  She has only taken emergen-c and nasal spray   Past medical history, Surgical history, Family history not pertinant except as noted below, Social history, Allergies, and medications have been entered into the medical record, reviewed, and corrections made.   Review of Systems: No fevers, chills, night sweats, weight loss, chest pain, or shortness of breath.   Objective:    General: Speaking clearly in complete sentences without any shortness of breath.  Alert and oriented x3.  Normal judgment. No apparent acute distress.    Impression and Recommendations:   Upper respiratory infection-discussed getting a PCR Covid test and encouraged her to do so.  She thinks she might be able to get that done through work but if not she will give [place] a call back.  Suspect that this is some type of viral illness.  Recommend symptomatic care.  She really wanted to try a zpack and see if feels better.  Gust that we do not typically prescribe antibiotics unless you have  been sick for a week, but will send over if not improving.  Okay to use Tylenol and ibuprofen and over-the-counter cough and cold medication.  Please give [place] call back if she develops any new symptoms or worsening symptoms.  She does not feel short of breath currently which is reassuring.  Did warn to go to the emergency department though if she develops any worsening chest symptoms.    I discussed the assessment and treatment plan with the patient. The patient was provided an opportunity to ask questions and all were answered. The patient agreed with the plan and demonstrated an understanding of the instructions.   The patient was advised to call back or seek an in-person evaluation if the symptoms worsen or if the condition fails to improve as anticipated.   Beatrice Lecher, MD

## 2019-03-09 ENCOUNTER — Ambulatory Visit: Payer: BC Managed Care – PPO | Admitting: Sports Medicine

## 2019-03-16 ENCOUNTER — Ambulatory Visit (INDEPENDENT_AMBULATORY_CARE_PROVIDER_SITE_OTHER): Payer: BC Managed Care – PPO | Admitting: Sports Medicine

## 2019-03-16 ENCOUNTER — Other Ambulatory Visit: Payer: Self-pay

## 2019-03-16 DIAGNOSIS — M5136 Other intervertebral disc degeneration, lumbar region: Secondary | ICD-10-CM | POA: Diagnosis not present

## 2019-03-16 DIAGNOSIS — M47812 Spondylosis without myelopathy or radiculopathy, cervical region: Secondary | ICD-10-CM | POA: Insufficient documentation

## 2019-03-16 DIAGNOSIS — M51369 Other intervertebral disc degeneration, lumbar region without mention of lumbar back pain or lower extremity pain: Secondary | ICD-10-CM

## 2019-03-16 NOTE — Assessment & Plan Note (Addendum)
Does well with occasional epidurals, typically left L4-L5 and interlaminar. MRI was in 2018. Last epidural was in January 2019. She is doing okay now, Tylenol is sufficient, I am going to add some lumbar spine rehab exercises and she can return to see me as needed.

## 2019-03-16 NOTE — Assessment & Plan Note (Signed)
Rachel Mack also complains of an odd grinding sensation when she extends her neck and then rotates. Its only minimally painful, nothing radicular, no trauma, no progressive weakness. We can simply watch this for now.

## 2019-03-16 NOTE — Progress Notes (Addendum)
    Procedures performed today:    None.  Independent interpretation of tests performed by another provider:   None.  Impression and Recommendations:    Lumbar degenerative disc disease with left-sided foot drop Does well with occasional epidurals, typically left L4-L5 and interlaminar. MRI was in 2018. Last epidural was in January 2019. She is doing okay now, Tylenol is sufficient, I am going to add some lumbar spine rehab exercises and she can return to see me as needed.  Cervical spondylosis Ridging also complains of an odd grinding sensation when she extends her neck and then rotates. Its only minimally painful, nothing radicular, no trauma, no progressive weakness. We can simply watch this for now.    ___________________________________________ Gwen Her. Dianah Field, M.D., ABFM., CAQSM. Primary Care and [place] Instructor of [place] [facility]

## 2019-04-01 ENCOUNTER — Encounter: Payer: Self-pay | Admitting: Family Medicine

## 2019-04-01 DIAGNOSIS — H9319 Tinnitus, unspecified ear: Secondary | ICD-10-CM

## 2019-04-02 NOTE — Telephone Encounter (Signed)
Referral pended, DX Tinnitus

## 2019-04-02 NOTE — Telephone Encounter (Signed)
Order signed.

## 2019-04-12 ENCOUNTER — Encounter: Payer: Self-pay | Admitting: Family Medicine

## 2019-04-12 ENCOUNTER — Other Ambulatory Visit: Payer: Self-pay | Admitting: Family Medicine

## 2019-04-12 ENCOUNTER — Telehealth (INDEPENDENT_AMBULATORY_CARE_PROVIDER_SITE_OTHER): Payer: BC Managed Care – PPO | Admitting: Family Medicine

## 2019-04-12 DIAGNOSIS — F418 Other specified anxiety disorders: Secondary | ICD-10-CM | POA: Diagnosis not present

## 2019-04-12 MED ORDER — WELLBUTRIN XL 150 MG PO TB24: 150.0000 mg | ORAL_TABLET | Freq: Every day | ORAL | 1 refills | Status: DC

## 2019-04-12 NOTE — Assessment & Plan Note (Signed)
GAD- 7 score of 11 and PHQ-9 score of 11.  Discussed options including therapy/counseling, medication, and exercise.  IN regards to medication she is interested in retrying the wellbutrin. Will start 150 XL. She prefers brand only.  She wants something mild. Discussed monitor for S.E like HA and palpations which we have on her intolerance list.  If occurs stop med and give [place] a call. Plan to f/U in 3 weeks.  She declines therapy but will consider it.  Discussed strategies for an exercise routine.
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
## 2019-04-13 ENCOUNTER — Encounter: Payer: Self-pay | Admitting: Family Medicine

## 2019-04-13 ENCOUNTER — Telehealth: Payer: Self-pay | Admitting: Family Medicine

## 2019-04-13 MED ORDER — SERTRALINE HCL 50 MG PO TABS: ORAL_TABLET | ORAL | 1 refills | Status: DC

## 2019-04-13 NOTE — Telephone Encounter (Signed)
Already addressed in another message.

## 2019-04-13 NOTE — Telephone Encounter (Signed)
PT is experiencing palpitations when taking Wellbutrin.  Please contact the PT and let them know if she should continue the medication.

## 2019-04-13 NOTE — Telephone Encounter (Signed)
Please advise 

## 2019-04-26 ENCOUNTER — Other Ambulatory Visit: Payer: Self-pay | Admitting: Family Medicine

## 2019-05-04 ENCOUNTER — Other Ambulatory Visit: Payer: Self-pay | Admitting: Family Medicine

## 2019-05-05 ENCOUNTER — Other Ambulatory Visit: Payer: Self-pay | Admitting: Family Medicine

## 2019-05-31 ENCOUNTER — Other Ambulatory Visit: Payer: Self-pay | Admitting: Family Medicine

## 2019-05-31 DIAGNOSIS — F5101 Primary insomnia: Secondary | ICD-10-CM

## 2019-05-31 NOTE — Telephone Encounter (Signed)
Routing to covering provider. [facility] requesting med refill for alprazolam.

## 2019-06-27 ENCOUNTER — Telehealth: Payer: Self-pay | Admitting: Family Medicine

## 2019-06-27 NOTE — Telephone Encounter (Signed)
Called pt and advised her of recommendations. She will wait until June to do her wellness exam to have this done.

## 2019-06-27 NOTE — Telephone Encounter (Signed)
Pt called and wants to know when she needs to get labs again since her Albumin was a little elevated last time? Please call patient and advise.

## 2019-08-03 ENCOUNTER — Other Ambulatory Visit: Payer: Self-pay | Admitting: Family Medicine

## 2019-08-03 ENCOUNTER — Other Ambulatory Visit: Payer: Self-pay | Admitting: Medical-Surgical

## 2019-08-03 DIAGNOSIS — F5101 Primary insomnia: Secondary | ICD-10-CM

## 2019-08-04 NOTE — Telephone Encounter (Signed)
Routing to PCP

## 2019-09-07 ENCOUNTER — Encounter: Payer: Self-pay | Admitting: Family Medicine

## 2019-09-10 ENCOUNTER — Encounter: Payer: Self-pay | Admitting: Family Medicine

## 2019-09-10 ENCOUNTER — Ambulatory Visit (INDEPENDENT_AMBULATORY_CARE_PROVIDER_SITE_OTHER): Payer: BC Managed Care – PPO | Admitting: Family Medicine

## 2019-09-10 ENCOUNTER — Other Ambulatory Visit: Payer: Self-pay

## 2019-09-10 VITALS — BP 113/71 | HR 65 | Ht 65.0 in | Wt 137.0 lb

## 2019-09-10 DIAGNOSIS — Z0000 Encounter for general adult medical examination without abnormal findings: Secondary | ICD-10-CM

## 2019-09-10 DIAGNOSIS — E038 Other specified hypothyroidism: Secondary | ICD-10-CM

## 2019-09-10 DIAGNOSIS — I10 Essential (primary) hypertension: Secondary | ICD-10-CM

## 2019-09-10 DIAGNOSIS — Z23 Encounter for immunization: Secondary | ICD-10-CM

## 2019-09-10 DIAGNOSIS — N6459 Other signs and symptoms in breast: Secondary | ICD-10-CM

## 2019-09-10 DIAGNOSIS — E538 Deficiency of other specified B group vitamins: Secondary | ICD-10-CM

## 2019-09-10 LAB — COMPLETE METABOLIC PANEL WITH GFR
AG Ratio: 2.4 (calc) (ref 1.0–2.5)
ALT: 15 U/L (ref 6–29)
AST: 15 U/L (ref 10–35)
Albumin: 5.1 g/dL (ref 3.6–5.1)
Alkaline phosphatase (APISO): 49 U/L (ref 37–153)
BUN: 13 mg/dL (ref 7–25)
CO2: 30 mmol/L (ref 20–32)
Calcium: 9.7 mg/dL (ref 8.6–10.4)
Chloride: 108 mmol/L (ref 98–110)
Creat: 0.61 mg/dL (ref 0.50–1.05)
GFR, Est African American: 117 mL/min/{1.73_m2} (ref >=60)
GFR, Est Non African American: 101 mL/min/{1.73_m2} (ref >=60)
Globulin: 2.1 g/dL (calc) (ref 1.9–3.7)
Glucose, Bld: 91 mg/dL (ref 65–99)
Potassium: 4.4 mmol/L (ref 3.5–5.3)
Sodium: 141 mmol/L (ref 135–146)
Total Bilirubin: 0.5 mg/dL (ref 0.2–1.2)
Total Protein: 7.2 g/dL (ref 6.1–8.1)

## 2019-09-10 LAB — LIPID PANEL
Cholesterol: 165 mg/dL (ref <200)
HDL: 56 mg/dL (ref >=50)
LDL Cholesterol (Calc): 85 mg/dL (calc)
Non-HDL Cholesterol (Calc): 109 mg/dL (calc) (ref <130)
Total CHOL/HDL Ratio: 2.9 (calc) (ref <5.0)
Triglycerides: 137 mg/dL (ref <150)

## 2019-09-10 LAB — CBC
HCT: 43.6 % (ref 35.0–45.0)
Hemoglobin: 14.5 g/dL (ref 11.7–15.5)
MCH: 32.4 pg (ref 27.0–33.0)
MCHC: 33.3 g/dL (ref 32.0–36.0)
MCV: 97.5 fL (ref 80.0–100.0)
MPV: 11.7 fL (ref 7.5–12.5)
Platelets: 207 10*3/uL (ref 140–400)
RBC: 4.47 10*6/uL (ref 3.80–5.10)
RDW: 11.8 % (ref 11.0–15.0)
WBC: 5.1 10*3/uL (ref 3.8–10.8)

## 2019-09-10 LAB — TSH: TSH: 1.74 mIU/L (ref 0.40–4.50)

## 2019-09-10 LAB — VITAMIN B12: Vitamin B-12: 759 pg/mL (ref 200–1100)

## 2019-09-10 NOTE — Progress Notes (Addendum)
Subjective:     Rachel Mack is a 59 y.o. female and is here for a comprehensive physical exam. The patient reports no problems.  She is doing well.    She did want a let me know that she is actually doing well on the sertraline its actually been really helpful in controlling some of her racing thoughts and anxiety but unfortunately she has noticed some sexual side effects and does not know what else to do about that.  She also reports that she has been able to come off of her hydrochlorothiazide since her mood has improved significantly her blood pressure has improved greatly as well and she has been able to monitor it and have great blood pressures off of the diuretic.  Social History   Socioeconomic History  . Marital status: Married    Spouse name: Not on file  . Number of children: 4  . Years of education: Not on file  . 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 Smoking status: Former Smoker    Quit date: 02/23/2004    Years since quitting: 15.5  . Smokeless tobacco: Never Used  Substance and Sexual Activity  . Alcohol use: No  . Drug use: No  . Sexual activity: Not on file    Comment: Works out regularly, married, 4 sons, youngest .  Other Topics Concern  . Not on file  Social History Narrative   Married, 4 children (32y, 28y, 21y, [place]).   Orig from [place] in [place], currently lives in [place].   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   Cigs: none   Alc: occ.  No drugs.   Exercise: no   Social Determinants of Health   Financial Resource Strain:   . Difficulty of Paying Living Expenses:   Food Insecurity:   . Worried About Charity fundraiser in the Last Year:   . Arboriculturist in the Last Year:   Transportation Needs:   . Film/video editor (Medical):   Marland Kitchen Lack of Transportation (Non-Medical):   Physical Activity:   . Days of Exercise per Week:   . Minutes of  Exercise per Session:   Stress:   . Feeling of Stress :   Social Connections:   . Frequency of Communication with Friends and Family:   . Frequency of Social Gatherings with Friends and Family:   . Attends Religious Services:   . Active Member of Clubs or Organizations:   . Attends Archivist Meetings:   Marland Kitchen Marital Status:   Intimate Partner Violence:   . Fear of Current or Ex-Partner:   . Emotionally Abused:   Marland Kitchen Physically Abused:   . Sexually Abused:    Health Maintenance  Topic Date Due  . INFLUENZA VACCINE  09/23/2019  . MAMMOGRAM  01/07/2021  . PAP SMEAR-Modifier  05/28/2022  . COLONOSCOPY  06/13/2024  . TETANUS/TDAP  09/09/2029  . COVID-19 Vaccine  Completed  . Hepatitis C Screening  Completed  . HIV Screening  Completed    The following portions of the patient's history were reviewed and updated as appropriate: allergies, current medications, past family history, past medical history, past social history, past surgical history and problem list.  Review of Systems A comprehensive review of systems was negative.   Objective:    BP 113/71   Pulse 65   Ht 5'  5" (1.651 m)   Wt 137 lb (62.1 kg)   LMP 04/23/2014   SpO2 100%   BMI 22.80 kg/m  General appearance: alert, cooperative and appears stated age Head: Normocephalic, without obvious abnormality, atraumatic Eyes: conj clear, EOmi, PEERLA Ears: normal TM's and external ear canals both ears Nose: Nares normal. Septum midline. Mucosa normal. No drainage or sinus tenderness. Throat: lips, mucosa, and tongue normal; teeth and gums normal Neck: no adenopathy, no carotid bruit, no JVD, supple, symmetrical, trachea midline and thyroid not enlarged, symmetric, no tenderness/mass/nodules Back: symmetric, no curvature. ROM normal. No CVA tenderness. Lungs: clear to auscultation bilaterally Breasts: normal appearance, no masses or tenderness Heart: regular rate and rhythm, S1, S2 normal, no murmur, click, rub or  gallop Abdomen: soft, non-tender; bowel sounds normal; no masses,  no organomegaly Extremities: extremities normal, atraumatic, no cyanosis or edema Pulses: 2+ and symmetric Skin: Skin color, texture, turgor normal. No rashes or lesions Lymph nodes: Cervical, supraclavicular, and axillary nodes normal. Neurologic: Grossly normal    Assessment:    Healthy female exam.      Plan:     See After Visit Summary for Counseling Recommendations   Keep up a regular exercise program and make sure you are eating a healthy diet Try to eat 4 servings of dairy a day, or if you are lactose intolerant take a calcium with vitamin D daily.  Your vaccines are up to date.  Mammogram is up-to-date and due in September. Tdap updated today. Colonoscopy up-to-date.  Sexual side effects from SSRI-she did not tolerate Wellbutrin well.  We discussed possibly adjusting her coming off the medication and or just trying to focus and have some very specific time for her and her husband together.  For now we will hold off on any specific changes.  She said she might try an herbal supplement which I think would be reasonable.

## 2019-09-10 NOTE — Patient Instructions (Addendum)
Health Maintenance, Female Adopting a healthy lifestyle and getting preventive care are important in promoting health and wellness. Ask your health care provider about:  The right schedule for you to have regular tests and exams.  Things you can do on your own to prevent diseases and keep yourself healthy. What should I know about diet, weight, and exercise? Eat a healthy diet   Eat a diet that includes plenty of vegetables, fruits, low-fat dairy products, and lean protein.  Do not eat a lot of foods that are high in solid fats, added sugars, or sodium. Maintain a healthy weight Body mass index (BMI) is used to identify weight problems. It estimates body fat based on height and weight. Your health care provider can help determine your BMI and help you achieve or maintain a healthy weight. Get regular exercise Get regular exercise. This is one of the most important things you can do for your health. Most adults should:  Exercise for at least 150 minutes each week. The exercise should increase your heart rate and make you sweat (moderate-intensity exercise).  Do strengthening exercises at least twice a week. This is in addition to the moderate-intensity exercise.  Spend less time sitting. Even light physical activity can be beneficial. Watch cholesterol and blood lipids Have your blood tested for lipids and cholesterol at 57 years of age, then have this test every 5 years. Have your cholesterol levels checked more often if:  Your lipid or cholesterol levels are high.  You are older than 57 years of age.  You are at high risk for heart disease. What should I know about cancer screening? Depending on your health history and family history, you may need to have cancer screening at various ages. This may include screening for:  Breast cancer.  Cervical cancer.  Colorectal cancer.  Skin cancer.  Lung cancer. What should I know about heart disease, diabetes, and high blood  pressure? Blood pressure and heart disease  High blood pressure causes heart disease and increases the risk of stroke. This is more likely to develop in people who have high blood pressure readings, are of African descent, or are overweight.  Have your blood pressure checked: ? Every 3-5 years if you are 68-56 years of age. ? Every year if you are 44 years old or older. Diabetes Have regular diabetes screenings. This checks your fasting blood sugar level. Have the screening done:  Once every three years after age 35 if you are at a normal weight and have a low risk for diabetes.  More often and at a younger age if you are overweight or have a high risk for diabetes. What should I know about preventing infection? Hepatitis B If you have a higher risk for hepatitis B, you should be screened for this virus. Talk with your health care provider to find out if you are at risk for hepatitis B infection. Hepatitis C Testing is recommended for:  Everyone born from [birth year] through 1963.  Anyone with known risk factors for hepatitis C. Sexually transmitted infections (STIs)  Get screened for STIs, including gonorrhea and chlamydia, if: ? You are sexually active and are younger than 57 years of age. ? You are older than 57 years of age and your health care provider tells you that you are at risk for this type of infection. ? Your sexual activity has changed since you were last screened, and you are at increased risk for chlamydia or gonorrhea. Ask your health care provider if  you are at risk.  Ask your health care provider about whether you are at high risk for HIV. Your health care provider may recommend a prescription medicine to help prevent HIV infection. If you choose to take medicine to prevent HIV, you should first get tested for HIV. You should then be tested every 3 months for as long as you are taking the medicine. Pregnancy  If you are about to stop having your period (premenopausal) and  you may become pregnant, seek counseling before you get pregnant.  Take 400 to 800 micrograms (mcg) of folic acid every day if you become pregnant.  Ask for birth control (contraception) if you want to prevent pregnancy. Osteoporosis and menopause Osteoporosis is a disease in which the bones lose minerals and strength with aging. This can result in bone fractures. If you are 54 years old or older, or if you are at risk for osteoporosis and fractures, ask your health care provider if you should:  Be screened for bone loss.  Take a calcium or vitamin D supplement to lower your risk of fractures.  Be given hormone replacement therapy (HRT) to treat symptoms of menopause. Follow these instructions at home: Lifestyle  Do not use any products that contain nicotine or tobacco, such as cigarettes, e-cigarettes, and chewing tobacco. If you need help quitting, ask your health care provider.  Do not use street drugs.  Do not share needles.  Ask your health care provider for help if you need support or information about quitting drugs. Alcohol use  Do not drink alcohol if: ? Your health care provider tells you not to drink. ? You are pregnant, may be pregnant, or are planning to become pregnant.  If you drink alcohol: ? Limit how much you use to 0-1 drink a day. ? Limit intake if you are breastfeeding.  Be aware of how much alcohol is in your drink. In the [place] one drink equals one 12 oz bottle of beer (355 mL), one 5 oz glass of wine (148 mL), or one 1 oz glass of hard liquor (44 mL). General instructions  Schedule regular health, dental, and eye exams.  Stay current with your vaccines.  Tell your health care provider if: ? You often feel depressed. ? You have ever been abused or do not feel safe at home. Summary  Adopting a healthy lifestyle and getting preventive care are important in promoting health and wellness.  Follow your health care provider's instructions about healthy  diet, exercising, and getting tested or screened for diseases.  Follow your health care provider's instructions on monitoring your cholesterol and blood pressure. This information is not intended to replace advice given to you by your health care provider. Make sure you discuss any questions you have with your health care provider. Document Revised: 02/01/2018 Document Reviewed: 02/01/2018 Elsevier Patient Education  [place].   Zoster Vaccine, Recombinant injection What is this medicine? ZOSTER VACCINE (ZOS ter vak SEEN) is used to prevent shingles in adults 57 years old and over. This vaccine is not used to treat shingles or nerve pain from shingles. This medicine may be used for other purposes; ask your health care provider or pharmacist if you have questions. COMMON BRAND NAME(S): [facility] What should I tell my health care provider before I take this medicine? They need to know if you have any of these conditions:  blood disorders or disease  cancer like leukemia or lymphoma  immune system problems or therapy  an unusual or  allergic reaction to vaccines, other medications, foods, dyes, or preservatives  pregnant or trying to get pregnant  breast-feeding How should I use this medicine? This vaccine is for injection in a muscle. It is given by a health care professional. Talk to your pediatrician regarding the use of this medicine in children. This medicine is not approved for use in children. Overdosage: If you think you have taken too much of this medicine contact a poison control center or emergency room at once. NOTE: This medicine is only for you. Do not share this medicine with others. What if I miss a dose? Keep appointments for follow-up (booster) doses as directed. It is important not to miss your dose. Call your doctor or health care professional if you are unable to keep an appointment. What may interact with this medicine?  medicines that suppress your  immune system  medicines to treat cancer  steroid medicines like prednisone or cortisone This list may not describe all possible interactions. Give your health care provider a list of all the medicines, herbs, non-prescription drugs, or dietary supplements you use. Also tell them if you smoke, drink alcohol, or use illegal drugs. Some items may interact with your medicine. What should I watch for while using this medicine? Visit your doctor for regular check ups. This vaccine, like all vaccines, may not fully protect everyone. What side effects may I notice from receiving this medicine? Side effects that you should report to your doctor or health care professional as soon as possible:  allergic reactions like skin rash, itching or hives, swelling of the face, lips, or tongue  breathing problems Side effects that usually do not require medical attention (report these to your doctor or health care professional if they continue or are bothersome):  chills  headache  fever  nausea, vomiting  redness, warmth, pain, swelling or itching at site where injected  tiredness This list may not describe all possible side effects. Call your doctor for medical advice about side effects. You may report side effects to FDA at 1-800-FDA-1088. Where should I keep my medicine? This vaccine is only given in a clinic, pharmacy, doctor's office, or other health care setting and will not be stored at home. NOTE: This sheet is a summary. It may not cover all possible information. If you have questions about this medicine, talk to your doctor, pharmacist, or health care provider.  2020 Elsevier/Gold Standard (2016-09-20 13:20:30)

## 2019-09-11 ENCOUNTER — Encounter: Payer: Self-pay | Admitting: Family Medicine

## 2019-10-04 ENCOUNTER — Other Ambulatory Visit: Payer: Self-pay | Admitting: Family Medicine

## 2019-10-04 DIAGNOSIS — F5101 Primary insomnia: Secondary | ICD-10-CM

## 2019-10-12 ENCOUNTER — Ambulatory Visit: Payer: BC Managed Care – PPO | Admitting: Family Medicine

## 2019-10-15 ENCOUNTER — Ambulatory Visit: Payer: BC Managed Care – PPO | Admitting: Family Medicine

## 2019-10-16 ENCOUNTER — Encounter: Payer: Self-pay | Admitting: Family Medicine

## 2019-10-23 ENCOUNTER — Other Ambulatory Visit: Payer: Self-pay

## 2019-10-23 DIAGNOSIS — E538 Deficiency of other specified B group vitamins: Secondary | ICD-10-CM

## 2019-10-23 MED ORDER — CYANOCOBALAMIN 1000 MCG/ML IJ SOLN: INTRAMUSCULAR | 0 refills | Status: DC

## 2019-11-05 ENCOUNTER — Encounter: Payer: Self-pay | Admitting: Family Medicine

## 2019-11-24 ENCOUNTER — Other Ambulatory Visit: Payer: Self-pay | Admitting: Sports Medicine

## 2019-11-24 ENCOUNTER — Other Ambulatory Visit: Payer: Self-pay | Admitting: Family Medicine

## 2019-11-24 DIAGNOSIS — E538 Deficiency of other specified B group vitamins: Secondary | ICD-10-CM

## 2019-11-24 DIAGNOSIS — F5101 Primary insomnia: Secondary | ICD-10-CM

## 2019-11-24 NOTE — Telephone Encounter (Signed)
To PCP

## 2019-11-27 MED ORDER — ALPRAZOLAM 0.25 MG PO TABS: ORAL_TABLET | ORAL | 1 refills | Status: DC

## 2019-11-27 NOTE — Addendum Note (Signed)
Addended by: Beatrice Lecher D on: 11/27/2019 09:20 PM   Modules accepted: Orders

## 2019-11-27 NOTE — Telephone Encounter (Signed)
Med sent again.  Rx error. If kicks back again may need to call in rx.

## 2019-12-06 ENCOUNTER — Ambulatory Visit (INDEPENDENT_AMBULATORY_CARE_PROVIDER_SITE_OTHER): Payer: BC Managed Care – PPO | Admitting: Sports Medicine

## 2019-12-06 ENCOUNTER — Ambulatory Visit (INDEPENDENT_AMBULATORY_CARE_PROVIDER_SITE_OTHER): Payer: BC Managed Care – PPO

## 2019-12-06 ENCOUNTER — Other Ambulatory Visit: Payer: Self-pay

## 2019-12-06 DIAGNOSIS — M67441 Ganglion, right hand: Secondary | ICD-10-CM

## 2019-12-06 NOTE — Progress Notes (Signed)
° ° °  Procedures performed today:    None.  Independent interpretation of notes and tests performed by another provider:   None.  Brief History, Exam, Impression, and Recommendations:    Ganglion cyst of ring finger of right hand This is a pleasant 59 year old female, she has noted a small nodule on the volar aspect of her right fourth DIP. It feels like a ganglion cyst, it is nontender, does not interfere with her activities, I advised her that we should probably leave this alone, I am happy to get some x-rays. She can try the Mediterranean type diet, but I also let her know that there is really no way to reverse osteoarthritis. She can stay active, and return to see me on an as-needed basis.    ___________________________________________ Gwen Her. Dianah Field, M.D., ABFM., CAQSM. Primary Care and [place] Instructor of [employer] [facility]

## 2019-12-06 NOTE — Assessment & Plan Note (Signed)
This is a pleasant 59 year old female, she has noted a small nodule on the volar aspect of her right fourth DIP. It feels like a ganglion cyst, it is nontender, does not interfere with her activities, I advised her that we should probably leave this alone, I am happy to get some x-rays. She can try the Mediterranean type diet, but I also let her know that there is really no way to reverse osteoarthritis. She can stay active, and return to see me on an as-needed basis.

## 2019-12-06 NOTE — Patient Instructions (Signed)
Mediterranean Diet A Mediterranean diet refers to food and lifestyle choices that are based on the traditions of countries located on the [place] Sea. This way of eating has been shown to help prevent certain conditions and improve outcomes for people who have chronic diseases, like kidney disease and heart disease. What are tips for following this plan? Lifestyle  Cook and eat meals together with your family, when possible.  Drink enough fluid to keep your urine clear or pale yellow.  Be physically active every day. This includes: ? Aerobic exercise like running or swimming. ? Leisure activities like gardening, walking, or housework.  Get 7-8 hours of sleep each night.  If recommended by your health care provider, drink red wine in moderation. This means 1 glass a day for nonpregnant women and 2 glasses a day for men. A glass of wine equals 5 oz (150 mL). Reading food labels  Check the serving size of packaged foods. For foods such as rice and pasta, the serving size refers to the amount of cooked product, not dry.  Check the total fat in packaged foods. Avoid foods that have saturated fat or trans fats.  Check the ingredients list for added sugars, such as corn syrup.   Shopping  At the grocery store, buy most of your food from the areas near the walls of the store. This includes: ? Fresh fruits and vegetables (produce). ? Grains, beans, nuts, and seeds. Some of these may be available in unpackaged forms or large amounts (in bulk). ? Fresh seafood. ? Poultry and eggs. ? Low-fat dairy products.  Buy whole ingredients instead of prepackaged foods.  Buy fresh fruits and vegetables in-season from local farmers markets.  Buy frozen fruits and vegetables in resealable bags.  If you do not have access to quality fresh seafood, buy precooked frozen shrimp or canned fish, such as tuna, salmon, or sardines.  Buy small amounts of raw or cooked vegetables, salads, or olives from  the deli or salad bar at your store.  Stock your pantry so you always have certain foods on hand, such as olive oil, canned tuna, canned tomatoes, rice, pasta, and beans. Cooking  Cook foods with extra-virgin olive oil instead of using butter or other vegetable oils.  Have meat as a side dish, and have vegetables or grains as your main dish. This means having meat in small portions or adding small amounts of meat to foods like pasta or stew.  Use beans or vegetables instead of meat in common dishes like chili or lasagna.  Experiment with different cooking methods. Try roasting or broiling vegetables instead of steaming or sauteing them.  Add frozen vegetables to soups, stews, pasta, or rice.  Add nuts or seeds for added healthy fat at each meal. You can add these to yogurt, salads, or vegetable dishes.  Marinate fish or vegetables using olive oil, lemon juice, garlic, and fresh herbs. Meal planning  Plan to eat 1 vegetarian meal one day each week. Try to work up to 2 vegetarian meals, if possible.  Eat seafood 2 or more times a week.  Have healthy snacks readily available, such as: ? Vegetable sticks with hummus. ? Greek yogurt. ? Fruit and nut trail mix.  Eat balanced meals throughout the week. This includes: ? Fruit: 2-3 servings a day ? Vegetables: 4-5 servings a day ? Low-fat dairy: 2 servings a day ? Fish, poultry, or lean meat: 1 serving a day ? Beans and legumes: 2 or more servings a week ?   Nuts and seeds: 1-2 servings a day ? Whole grains: 6-8 servings a day ? Extra-virgin olive oil: 3-4 servings a day  Limit red meat and sweets to only a few servings a month   What are my food choices?  Mediterranean diet ? Recommended  Grains: Whole-grain pasta. Brown rice. Bulgar wheat. Polenta. Couscous. Whole-wheat bread. Oatmeal. Quinoa.  Vegetables: Artichokes. Beets. Broccoli. Cabbage. Carrots. Eggplant. Green beans. Chard. Kale. Spinach. Onions. Leeks. Peas. Squash.  Tomatoes. Peppers. Radishes.  Fruits: Apples. Apricots. Avocado. Berries. Bananas. Cherries. Dates. Figs. Grapes. Lemons. Melon. Oranges. Peaches. Plums. Pomegranate.  Meats and other protein foods: Beans. Almonds. Sunflower seeds. Pine nuts. Peanuts. Cod. Salmon. Scallops. Shrimp. Tuna. Tilapia. Clams. Oysters. Eggs.  Dairy: Low-fat milk. Cheese. Greek yogurt.  Beverages: Water. Red wine. Herbal tea.  Fats and oils: Extra virgin olive oil. Avocado oil. Grape seed oil.  Sweets and desserts: Greek yogurt with honey. Baked apples. Poached pears. Trail mix.  Seasoning and other foods: Basil. Cilantro. Coriander. Cumin. Mint. Parsley. Sage. Rosemary. Tarragon. Garlic. Oregano. Thyme. Pepper. Balsalmic vinegar. Tahini. Hummus. Tomato sauce. Olives. Mushrooms. ? Limit these  Grains: Prepackaged pasta or rice dishes. Prepackaged cereal with added sugar.  Vegetables: Deep fried potatoes (french fries).  Fruits: Fruit canned in syrup.  Meats and other protein foods: Beef. Pork. Lamb. Poultry with skin. Hot dogs. Bacon.  Dairy: Ice cream. Sour cream. Whole milk.  Beverages: Juice. Sugar-sweetened soft drinks. Beer. Liquor and spirits.  Fats and oils: Butter. Canola oil. Vegetable oil. Beef fat (tallow). Lard.  Sweets and desserts: Cookies. Cakes. Pies. Candy.  Seasoning and other foods: Mayonnaise. Premade sauces and marinades. The items listed may not be a complete list. Talk with your dietitian about what dietary choices are right for you. Summary  The Mediterranean diet includes both food and lifestyle choices.  Eat a variety of fresh fruits and vegetables, beans, nuts, seeds, and whole grains.  Limit the amount of red meat and sweets that you eat.  Talk with your health care provider about whether it is safe for you to drink red wine in moderation. This means 1 glass a day for nonpregnant women and 2 glasses a day for men. A glass of wine equals 5 oz (150 mL). This information  is not intended to replace advice given to you by your health care provider. Make sure you discuss any questions you have with your health care provider. Document Revised: 10/09/2015 Document Reviewed: 10/02/2015 Elsevier Patient Education  2020 Elsevier Inc.  

## 2019-12-20 ENCOUNTER — Other Ambulatory Visit: Payer: Self-pay | Admitting: Family Medicine

## 2019-12-20 DIAGNOSIS — Z1231 Encounter for screening mammogram for malignant neoplasm of breast: Secondary | ICD-10-CM

## 2020-02-07 ENCOUNTER — Ambulatory Visit (INDEPENDENT_AMBULATORY_CARE_PROVIDER_SITE_OTHER): Payer: BC Managed Care – PPO

## 2020-02-07 ENCOUNTER — Other Ambulatory Visit: Payer: Self-pay

## 2020-02-07 DIAGNOSIS — Z1231 Encounter for screening mammogram for malignant neoplasm of breast: Secondary | ICD-10-CM

## 2020-02-11 ENCOUNTER — Encounter: Payer: Self-pay | Admitting: Family Medicine

## 2020-03-11 ENCOUNTER — Other Ambulatory Visit: Payer: Self-pay | Admitting: Family Medicine

## 2020-03-11 DIAGNOSIS — F5101 Primary insomnia: Secondary | ICD-10-CM

## 2020-03-11 DIAGNOSIS — E538 Deficiency of other specified B group vitamins: Secondary | ICD-10-CM

## 2020-03-11 NOTE — Telephone Encounter (Signed)
Xanax last written 11/27/2019 #30 1 RF

## 2020-04-16 ENCOUNTER — Other Ambulatory Visit: Payer: Self-pay | Admitting: Family Medicine

## 2020-06-06 ENCOUNTER — Other Ambulatory Visit: Payer: Self-pay | Admitting: Family Medicine

## 2020-06-19 ENCOUNTER — Encounter: Payer: Self-pay | Admitting: Family Medicine

## 2020-06-19 NOTE — Telephone Encounter (Signed)
Okay to place referral for polyarthralgia.  But it has been about 4 years since we have done some autoimmune work-up.  I would recommend doing at least some basic work-up seeing the rheumatologist if she is okay with that I am more than happy to order the labs.  If not we can go ahead and place referral that is perfectly fine.

## 2020-06-21 ENCOUNTER — Other Ambulatory Visit: Payer: Self-pay | Admitting: Family Medicine

## 2020-06-21 DIAGNOSIS — F5101 Primary insomnia: Secondary | ICD-10-CM

## 2020-08-05 ENCOUNTER — Other Ambulatory Visit: Payer: Self-pay | Admitting: Family Medicine

## 2020-09-15 ENCOUNTER — Other Ambulatory Visit: Payer: Self-pay

## 2020-09-15 ENCOUNTER — Encounter: Payer: Self-pay | Admitting: Family Medicine

## 2020-09-15 ENCOUNTER — Ambulatory Visit (INDEPENDENT_AMBULATORY_CARE_PROVIDER_SITE_OTHER): Payer: BC Managed Care – PPO | Admitting: Family Medicine

## 2020-09-15 VITALS — BP 113/74 | HR 71 | Ht 60.0 in | Wt 142.1 lb

## 2020-09-15 DIAGNOSIS — Z780 Asymptomatic menopausal state: Secondary | ICD-10-CM | POA: Diagnosis not present

## 2020-09-15 DIAGNOSIS — Z0000 Encounter for general adult medical examination without abnormal findings: Secondary | ICD-10-CM | POA: Diagnosis not present

## 2020-09-15 DIAGNOSIS — R6882 Decreased libido: Secondary | ICD-10-CM

## 2020-09-15 DIAGNOSIS — Z79890 Hormone replacement therapy (postmenopausal): Secondary | ICD-10-CM

## 2020-09-15 NOTE — Progress Notes (Signed)
Subjective:     Rachel Mack is a 60 y.o. female and is here for a comprehensive physical exam. The patient reports problems - post meno sxs .  She struggling mostly with abdominal weight gain, low libido, and vaginal atrophy. No hot flashes.  No significant sleep disruption.  She feels like the low libido is starting to affect her marriage.  Social History   Socioeconomic History   Marital status: Married    Spouse name: Not on file   Number of children: 4   Years of education: Not on file   Highest education level: Not on file  Occupational History    Employer: [employer]    Comment: CNA  Tobacco Use   Smoking status: Former    Types: Cigarettes    Quit date: 02/23/2004    Years since quitting: 16.5   Smokeless tobacco: Never  Vaping Use   Vaping Use: Never used  Substance and Sexual Activity   Alcohol use: No   Drug use: No   Sexual activity: Not on file    Comment: Works out regularly, married, 4 sons, youngest .  Other Topics Concern   Not on file  Social History Narrative   Married, 4 children (32y, 28y, 21y, [place]).   Orig from [place] in [place], currently lives in [place].   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   Cigs: none   Alc: occ.  No drugs.   Exercise: no   Social Determinants of Radio broadcast assistant Strain: Not on file  Food Insecurity: Not on file  Transportation Needs: Not on file  Physical Activity: Not on file  Stress: Not on file  Social Connections: Not on file  Intimate Partner Violence: Not on file   Health Maintenance  Topic Date Due   Zoster Vaccines- Shingrix (1 of 2) Never done   COVID-19 Vaccine (3 - Booster for Moderna series) 08/19/2019   INFLUENZA VACCINE  09/22/2020   MAMMOGRAM  02/06/2022   PAP SMEAR-Modifier  05/28/2022   COLONOSCOPY (Pts 45-25yr Insurance coverage will need to be confirmed)  06/13/2024   TETANUS/TDAP  09/09/2029   Hepatitis C Screening  Completed   HIV Screening  Completed    Pneumococcal Vaccine [id number] old  Aged Out   HPV VACCINES  Aged Out    The following portions of the patient's history were reviewed and updated as appropriate: allergies, current medications, past family history, past medical history, past social history, past surgical history, and problem list.  Review of Systems Pertinent items are noted in HPI.   Objective:    BP 113/74   Pulse 71   Ht 5' (1.524 m)   Wt 142 lb 1.9 oz (64.5 kg)   LMP 04/23/2014   SpO2 100%   BMI 27.76 kg/m  General appearance: alert, cooperative, and appears stated age Head: Normocephalic, without obvious abnormality, atraumatic Eyes:  conj clear EOMI, PEERLA Ears: normal TM's and external ear canals both ears Nose: Nares normal. Septum midline. Mucosa normal. No drainage or sinus tenderness. Throat: lips, mucosa, and tongue normal; teeth and gums normal Neck: no adenopathy, no carotid bruit, no JVD, supple, symmetrical, trachea midline, and thyroid not enlarged, symmetric, no tenderness/mass/nodules Back: symmetric, no curvature. ROM normal. No CVA tenderness. Lungs: clear to auscultation bilaterally Breasts: normal appearance, no masses or tenderness Heart: regular rate and rhythm, S1, S2 normal, no murmur, click, rub or gallop Abdomen: soft, non-tender; bowel sounds normal; no masses,  no  organomegaly Extremities: extremities normal, atraumatic, no cyanosis or edema Pulses: 2+ and symmetric Skin: Skin color, texture, turgor normal. No rashes or lesions Lymph nodes: Cervical, supraclavicular, and axillary nodes normal. Neurologic: Grossly normal    Assessment:    Healthy female exam.      Plan:     See After Visit Summary for Counseling Recommendations  Keep up a regular exercise program and make sure you are eating a healthy diet Try to eat 4 servings of dairy a day, or if you are lactose intolerant take a calcium with vitamin D daily.  Your vaccines are up to date.  Get updated lab work  today.  Post menopause - she is interested in bioidentical hormones.  She would like her hormones check first.  Also plan to recheck her thyroid just to make sure that her current dosing is adequate.  It looked fantastic a year ago.  Low libido-see note above.

## 2020-09-16 ENCOUNTER — Encounter: Payer: Self-pay | Admitting: Family Medicine

## 2020-09-16 LAB — LIPID PANEL W/REFLEX DIRECT LDL
Cholesterol: 193 mg/dL (ref <200)
HDL: 68 mg/dL (ref >=50)
LDL Cholesterol (Calc): 102 mg/dL (calc) — ABNORMAL HIGH
Non-HDL Cholesterol (Calc): 125 mg/dL (calc) (ref <130)
Total CHOL/HDL Ratio: 2.8 (calc) (ref <5.0)
Triglycerides: 133 mg/dL (ref <150)

## 2020-09-16 LAB — TSH: TSH: 1.9 mIU/L (ref 0.40–4.50)

## 2020-09-16 LAB — COMPLETE METABOLIC PANEL WITH GFR
AG Ratio: 2.1 (calc) (ref 1.0–2.5)
ALT: 18 U/L (ref 6–29)
AST: 20 U/L (ref 10–35)
Albumin: 5.1 g/dL (ref 3.6–5.1)
Alkaline phosphatase (APISO): 46 U/L (ref 37–153)
BUN: 11 mg/dL (ref 7–25)
CO2: 28 mmol/L (ref 20–32)
Calcium: 10.2 mg/dL (ref 8.6–10.4)
Chloride: 106 mmol/L (ref 98–110)
Creat: 0.63 mg/dL (ref 0.50–1.03)
Globulin: 2.4 g/dL (calc) (ref 1.9–3.7)
Glucose, Bld: 91 mg/dL (ref 65–139)
Potassium: 4.6 mmol/L (ref 3.5–5.3)
Sodium: 143 mmol/L (ref 135–146)
Total Bilirubin: 0.9 mg/dL (ref 0.2–1.2)
Total Protein: 7.5 g/dL (ref 6.1–8.1)
eGFR: 103 mL/min/{1.73_m2} (ref >=60)

## 2020-09-16 LAB — CBC
HCT: 43.6 % (ref 35.0–45.0)
Hemoglobin: 14.4 g/dL (ref 11.7–15.5)
MCH: 32.1 pg (ref 27.0–33.0)
MCHC: 33 g/dL (ref 32.0–36.0)
MCV: 97.1 fL (ref 80.0–100.0)
MPV: 11.2 fL (ref 7.5–12.5)
Platelets: 240 10*3/uL (ref 140–400)
RBC: 4.49 10*6/uL (ref 3.80–5.10)
RDW: 12 % (ref 11.0–15.0)
WBC: 5.9 10*3/uL (ref 3.8–10.8)

## 2020-09-16 LAB — HEMOGLOBIN A1C
Hgb A1c MFr Bld: 5.1 % of total Hgb (ref <5.7)
Mean Plasma Glucose: 100 mg/dL
eAG (mmol/L): 5.5 mmol/L

## 2020-09-16 LAB — ESTRADIOL: Estradiol: <15 pg/mL

## 2020-09-16 LAB — PROGESTERONE: Progesterone: <0.5 ng/mL

## 2020-09-16 LAB — FOLLICLE STIMULATING HORMONE: FSH: 134.6 m[IU]/mL — ABNORMAL HIGH

## 2020-09-16 LAB — LUTEINIZING HORMONE: LH: 56 m[IU]/mL

## 2020-09-16 MED ORDER — ANGELIQ 0.25-0.5 MG PO TABS: 1.0000 | ORAL_TABLET | Freq: Every day | ORAL | 11 refills | Status: DC

## 2020-09-24 ENCOUNTER — Encounter: Payer: Self-pay | Admitting: Family Medicine

## 2020-09-25 ENCOUNTER — Ambulatory Visit: Payer: BC Managed Care – PPO | Admitting: Family Medicine

## 2020-09-25 ENCOUNTER — Ambulatory Visit (INDEPENDENT_AMBULATORY_CARE_PROVIDER_SITE_OTHER): Payer: BC Managed Care – PPO | Admitting: Family Medicine

## 2020-09-25 ENCOUNTER — Other Ambulatory Visit: Payer: Self-pay

## 2020-09-25 ENCOUNTER — Encounter: Payer: Self-pay | Admitting: Family Medicine

## 2020-09-25 VITALS — BP 112/74 | HR 66 | Temp 98.6°F | Ht 60.0 in | Wt 144.0 lb

## 2020-09-25 DIAGNOSIS — R8290 Unspecified abnormal findings in urine: Secondary | ICD-10-CM | POA: Diagnosis not present

## 2020-09-25 DIAGNOSIS — R102 Pelvic and perineal pain: Secondary | ICD-10-CM

## 2020-09-25 LAB — POCT URINALYSIS DIPSTICK
Bilirubin, UA: NEGATIVE
Blood, UA: NEGATIVE
Glucose, UA: NEGATIVE
Ketones, UA: NEGATIVE
Leukocytes, UA: NEGATIVE
Nitrite, UA: NEGATIVE
Protein, UA: NEGATIVE
Spec Grav, UA: <=1.005 — AB (ref 1.010–1.025)
Urobilinogen, UA: 0.2 E.U./dL
pH, UA: 5.5 (ref 5.0–8.0)

## 2020-09-25 NOTE — Progress Notes (Signed)
Acute Office Visit  Subjective:    Patient ID: Rachel Mack, female    DOB: 07/03/1960, 60 y.o.   MRN: [id number]  Chief Complaint  Patient presents with   Abdominal Pain    Onset x 2 days ago   Pelvic Pain    Onset x 2 days ago    HPI Patient is in today for suprapubic pressure/pain for the last couple of days.  She says it has not been severe has not kept her from doing anything it is really much more of a pressure.  She does not have pain with urination but says that after she urinates she actually gets a little bit of pressure relief in that area she denies constipation or blood in the urine or stool she says she is also noticed a little vaginal itch and irritation she did just start hormone replacement about 7 days ago.  She does drink plenty of water daily.  No radiation of pain into her upper abdomen or her back.  Past Medical History:  Diagnosis Date   B12 DEFICIENCY 12/26/2008   Qualifier: Diagnosis of  By: Valetta Close DO, Karen     Closed fracture of left distal fibula 04/26/2016   Closed fracture of right distal radius 04/26/2016   DEPRESSION, MILD 04/25/2008   Qualifier: Diagnosis of  By: Valetta Close DO, Karen     Displaced fracture of hook process of hamate (unciform) bone, right wrist, initial encounter for closed fracture 04/26/2016   Essential hypertension, benign 09/16/2010   Eustachian tube dysfunction, bilateral 11/27/2015   Hematuria of undiagnosed cause 01/26/2011   Overview:  ICD-10 cut over    Hypertension    Hypothyroidism    Insomnia 11/01/2011   Inverted nipple 02/06/2013   IRREGULAR MENSES 07/31/2008   Qualifier: Diagnosis of  By: Valetta Close DO, Karen     Lumbar degenerative disc disease with left-sided foot drop 03/03/2015   Nephrolithiasis    nonobstructive; has never passed one   Osteopenia 04/18/2015   Percell Miller 2017-T score of -1.6.    Pericardial effusion 01/08/2011   Perimenopausal    helped regulate menses   PERIMENOPAUSAL STATUS 04/25/2008   Qualifier: Diagnosis of   By: Valetta Close DO, Karen     TFCC (triangular fibrocartilage complex) injury, right, initial encounter 08/23/2016   Vitamin B 12 deficiency     Past Surgical History:  Procedure Laterality Date   GYNECOLOGIC CRYOSURGERY  2005 or before   cervicitis (Dr. Lissa Hoard in [place])    Family History  Problem Relation Age of Onset   Breast cancer Mother 48       d age 40   Diabetes Mother    Hyperlipidemia Mother    Hypertension Mother    Drug abuse Sister    Colon cancer Paternal Grandmother 69    Social History   Socioeconomic History   Marital status: Married    Spouse name: Not on file   Number of children: 4   Years of education: Not on file   Highest education level: Not on file  Occupational History    Employer: [employer]    Comment: CNA  Tobacco Use   Smoking status: Former    Types: Cigarettes    Quit date: 02/23/2004    Years since quitting: 16.6   Smokeless tobacco: Never  Vaping Use   Vaping Use: Never used  Substance and Sexual Activity   Alcohol use: No   Drug use: No   Sexual activity: Not on  file    Comment: Works out regularly, married, 4 sons, youngest .  Other Topics Concern   Not on file  Social History Narrative   Married, 4 children (32y, 28y, 21y, [place]).   Orig from [place] in [place], currently lives in [place].   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   Cigs: none   Alc: occ.  No drugs.   Exercise: no   Social Determinants of Radio broadcast assistant Strain: Not on file  Food Insecurity: Not on file  Transportation Needs: Not on file  Physical Activity: Not on file  Stress: Not on file  Social Connections: Not on file  Intimate Partner Violence: Not on file    Outpatient Medications Prior to Visit  Medication Sig Dispense Refill   ALPRAZolam (XANAX) 0.25 MG tablet TAKE 1 TABLET BY MOUTH EVERY DAY AT BEDTIME AS NEEDED FOR SLEEP 30 tablet 1   AMBULATORY NON FORMULARY MEDICATION Medication Name: 60m syringe 21g 1.5 in  needles and 18 gauge blunt filler needles for B12 injections monthly disp qs x 6 months 1 each 0   Biotin 1 MG CAPS Take 1 mg by mouth.     cyanocobalamin (,VITAMIN B-12,) 1000 MCG/ML injection INJECT 1ML (1000MCG) INTRAMUSCULARLY ONCE AS DIRECTED. DISCARD 28 DAYS AFTER FIRST USE 3 mL 0   Drospirenone-Estradiol (ANGELIQ) 0.25-0.5 MG TABS Take 1 tablet by mouth daily. 28 tablet 11   fluticasone (FLONASE) 50 MCG/ACT nasal spray Place 1-2 sprays into both nostrils daily. 16 g 12   MAGNESIUM PO Take by mouth.     Misc Natural Products (SUPER-D3+) 5000 units CAPS Take by mouth.     SYNTHROID 50 MCG tablet TAKE 1 TABLET BY MOUTH EVERY DAY 90 tablet 0   No facility-administered medications prior to visit.    Allergies  Allergen Reactions   Sulfa Antibiotics    Amoxicillin Other (See Comments)    yeast infection   Bupropion Hcl Palpitations    Review of Systems     Objective:    Physical Exam Vitals reviewed.  Constitutional:      Appearance: She is well-developed.  HENT:     Head: Normocephalic and atraumatic.  Eyes:     Conjunctiva/sclera: Conjunctivae normal.  Cardiovascular:     Rate and Rhythm: Normal rate.  Pulmonary:     Effort: Pulmonary effort is normal.  Abdominal:     Tenderness: There is abdominal tenderness in the suprapubic area. There is no guarding or rebound.  Skin:    General: Skin is dry.     Coloration: Skin is not pale.  Neurological:     Mental Status: She is alert and oriented to person, place, and time.  Psychiatric:        Behavior: Behavior normal.    BP 112/74   Pulse 66   Temp 98.6 F (37 C) (Oral)   Ht 5' (1.524 m)   Wt 144 lb (65.3 kg)   LMP 04/23/2014   SpO2 100% Comment: on RA  BMI 28.12 kg/m  Wt Readings from Last 3 Encounters:  09/25/20 144 lb (65.3 kg)  09/15/20 142 lb 1.9 oz (64.5 kg)  09/10/19 137 lb (62.1 kg)    Health Maintenance Due  Topic Date Due   Zoster Vaccines- Shingrix (1 of 2) Never done   COVID-19 Vaccine (3 -  Booster for Moderna series) 08/19/2019   INFLUENZA VACCINE  09/22/2020    There are no preventive care reminders to display for this patient.  Lab Results  Component Value Date   TSH 1.90 09/15/2020   Lab Results  Component Value Date   WBC 5.9 09/15/2020   HGB 14.4 09/15/2020   HCT 43.6 09/15/2020   MCV 97.1 09/15/2020   PLT 240 09/15/2020   Lab Results  Component Value Date   NA 143 09/15/2020   K 4.6 09/15/2020   CO2 28 09/15/2020   GLUCOSE 91 09/15/2020   BUN 11 09/15/2020   CREATININE 0.63 09/15/2020   BILITOT 0.9 09/15/2020   ALKPHOS 60 08/17/2016   AST 20 09/15/2020   ALT 18 09/15/2020   PROT 7.5 09/15/2020   ALBUMIN 4.9 08/17/2016   CALCIUM 10.2 09/15/2020   EGFR 103 09/15/2020   Lab Results  Component Value Date   CHOL 193 09/15/2020   Lab Results  Component Value Date   HDL 68 09/15/2020   Lab Results  Component Value Date   LDLCALC 102 (H) 09/15/2020   Lab Results  Component Value Date   TRIG 133 09/15/2020   Lab Results  Component Value Date   CHOLHDL 2.8 09/15/2020   Lab Results  Component Value Date   HGBA1C 5.1 09/15/2020       Assessment & Plan:   Problem List Items Addressed This Visit   None Visit Diagnoses     Pelvic pain    -  Primary   Relevant Orders   POCT urinalysis dipstick (Completed)   WET PREP FOR TRICH, YEAST, CLUE   Abnormal urinalysis       Relevant Orders   CULTURE, URINE COMPREHENSIVE      Suprapubic pressure/pain-point-of-care urinalysis was negative.  Will send for culture.  We will also do a wet prep today since she is had a little vaginal irritation if not improving please let us know or if cultures and swab are negative consider further work-up with pelvic ultrasound if pain does not improve.  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  No orders of the defined types were placed in this  encounter.    Beatrice Lecher, MD

## 2020-09-25 NOTE — Telephone Encounter (Signed)
Called and spoke with patient. Left lower abdominal pain that comes and goes. She mainly feels it when she goes from sitting to standing. Scheduled her for appt today for recheck. She will try to make it if she can go into work late but may have to cancel if not.

## 2020-09-27 LAB — WET PREP FOR TRICH, YEAST, CLUE
MICRO NUMBER:: 12209318
Specimen Quality: ADEQUATE

## 2020-09-28 LAB — CULTURE, URINE COMPREHENSIVE
MICRO NUMBER:: 12209313
RESULT:: NO GROWTH
SPECIMEN QUALITY:: ADEQUATE

## 2020-10-07 ENCOUNTER — Other Ambulatory Visit: Payer: Self-pay | Admitting: Family Medicine

## 2020-10-07 DIAGNOSIS — E538 Deficiency of other specified B group vitamins: Secondary | ICD-10-CM

## 2020-10-07 DIAGNOSIS — F5101 Primary insomnia: Secondary | ICD-10-CM

## 2020-10-07 NOTE — Telephone Encounter (Signed)
Call pt: she needs labs for B12. Hasn't been check in over a year so we need to make sure her dose is adequate and she is not getting too much.

## 2020-10-08 ENCOUNTER — Other Ambulatory Visit: Payer: Self-pay | Admitting: *Deleted

## 2020-10-08 DIAGNOSIS — Z5181 Encounter for therapeutic drug level monitoring: Secondary | ICD-10-CM

## 2020-10-08 NOTE — Telephone Encounter (Signed)
Okay to add liver function.

## 2020-10-08 NOTE — Telephone Encounter (Signed)
Pt was advised that she will need to have her B12 lab done BEFORE her she does her next injection. Lab printed and faxed. She was told that if she was to need any additional labs that we will call her to inform her of this.

## 2020-10-08 NOTE — Telephone Encounter (Signed)
Pt informed of need for repeat labs for B12.  Pt also inquired about when she should have repeat labs for the new hormones that she has been started on.  Please advise.  Charyl Bigger, CMA

## 2020-10-23 ENCOUNTER — Other Ambulatory Visit: Payer: Self-pay | Admitting: *Deleted

## 2020-10-23 DIAGNOSIS — Z5181 Encounter for therapeutic drug level monitoring: Secondary | ICD-10-CM

## 2020-10-23 DIAGNOSIS — E538 Deficiency of other specified B group vitamins: Secondary | ICD-10-CM

## 2020-10-25 ENCOUNTER — Encounter: Payer: Self-pay | Admitting: Family Medicine

## 2020-10-25 LAB — HEPATIC FUNCTION PANEL
ALT: 16 IU/L (ref 0–32)
AST: 17 IU/L (ref 0–40)
Albumin: 4.8 g/dL (ref 3.8–4.9)
Alkaline Phosphatase: 53 IU/L (ref 44–121)
Bilirubin Total: 0.6 mg/dL (ref 0.0–1.2)
Bilirubin, Direct: 0.17 mg/dL (ref 0.00–0.40)
Total Protein: 6.9 g/dL (ref 6.0–8.5)

## 2020-10-25 LAB — VITAMIN B12: Vitamin B-12: 765 pg/mL (ref 232–1245)

## 2020-10-26 ENCOUNTER — Encounter: Payer: Self-pay | Admitting: Family Medicine

## 2020-10-28 NOTE — Progress Notes (Signed)
Your lab work is within acceptable range and there are no concerning findings.   ?

## 2020-11-07 ENCOUNTER — Ambulatory Visit: Payer: BC Managed Care – PPO | Admitting: Family Medicine

## 2020-11-10 ENCOUNTER — Ambulatory Visit: Payer: BC Managed Care – PPO | Admitting: Family Medicine

## 2020-11-21 ENCOUNTER — Other Ambulatory Visit: Payer: Self-pay | Admitting: Family Medicine

## 2020-11-21 DIAGNOSIS — E538 Deficiency of other specified B group vitamins: Secondary | ICD-10-CM

## 2020-12-17 ENCOUNTER — Encounter: Payer: Self-pay | Admitting: Family Medicine

## 2020-12-17 DIAGNOSIS — Z1379 Encounter for other screening for genetic and chromosomal anomalies: Secondary | ICD-10-CM

## 2021-01-08 ENCOUNTER — Other Ambulatory Visit: Payer: Self-pay | Admitting: Family Medicine

## 2021-01-08 DIAGNOSIS — Z1231 Encounter for screening mammogram for malignant neoplasm of breast: Secondary | ICD-10-CM

## 2021-01-12 ENCOUNTER — Encounter: Payer: Self-pay | Admitting: Family Medicine

## 2021-01-12 ENCOUNTER — Telehealth: Payer: BC Managed Care – PPO | Admitting: Family Medicine

## 2021-01-18 ENCOUNTER — Encounter: Payer: Self-pay | Admitting: Family Medicine

## 2021-01-20 ENCOUNTER — Telehealth (INDEPENDENT_AMBULATORY_CARE_PROVIDER_SITE_OTHER): Payer: BC Managed Care – PPO | Admitting: Family Medicine

## 2021-01-20 ENCOUNTER — Encounter: Payer: Self-pay | Admitting: Family Medicine

## 2021-01-20 DIAGNOSIS — F5101 Primary insomnia: Secondary | ICD-10-CM | POA: Diagnosis not present

## 2021-01-20 DIAGNOSIS — U071 COVID-19: Secondary | ICD-10-CM

## 2021-01-20 MED ORDER — ALPRAZOLAM 0.25 MG PO TABS: ORAL_TABLET | ORAL | 1 refills | Status: DC

## 2021-01-20 MED ORDER — PREDNISONE 20 MG PO TABS: 40.0000 mg | ORAL_TABLET | Freq: Every day | ORAL | 0 refills | Status: DC

## 2021-01-20 MED ORDER — FLUTICASONE PROPIONATE 50 MCG/ACT NA SUSP: 1.0000 | Freq: Every day | NASAL | 12 refills | Status: DC

## 2021-01-20 NOTE — Progress Notes (Signed)
Pt had COVID she still is experiencing some sinus issues facial pain and pressure but not as bad as it was. She was scheduled to RTW today but wanted to be seen. Denies any f/s/c

## 2021-01-20 NOTE — Progress Notes (Signed)
Virtual Visit via Video Note  I connected with Rachel Mack on 01/20/21 at 10:50 AM EST by a video enabled telemedicine application and verified that I am speaking with the correct person using two identifiers.   I discussed the limitations of evaluation and management by telemedicine and the availability of in person appointments. The patient expressed understanding and agreed to proceed.  Patient location: at home Provider location: in office  Subjective:    CC:   Chief Complaint  Patient presents with   Follow-up    HPI: She noted feeling sick about 13 days ago.  After about 3 days of illness she did test for COVID and was positive.  She says most of her symptoms have been gradually getting better but she still feels like she has a lot of nasal congestion and a little bit of pressure underneath both eyes, both ears feel stopped up.  She has had just a slight intermittent cough more so from drainage as she is had a lot of postnasal drip.  The drainage has been mostly clear.  No more fevers chills or sweats.  No major GI symptoms she still using severe cold and cough and some emerge.  She was supposed to return to work today but just does not feel good enough with her sinuses to return.   Past medical history, Surgical history, Family history not pertinant except as noted below, Social history, Allergies, and medications have been entered into the medical record, reviewed, and corrections made.    Objective:    General: Speaking clearly in complete sentences without any shortness of breath.  Alert and oriented x3.  Normal judgment. No apparent acute distress.    Impression and Recommendations:    Problem List Items Addressed This Visit       Other   Insomnia (Chronic)   Relevant Medications   ALPRAZolam (XANAX) 0.25 MG tablet   Other Visit Diagnoses     COVID-19    -  Primary       COVID-19-unfortunately I think she is just having lingering side effects to  just taking well for her to get better she does feel little bit better today than she did 2 days ago.  So we discussed maybe doing a trial of prednisone to see if we can help a little bit with the pressure and congestion but this point I do not feel like she has a secondary bacterial infection because she has been gradually getting better.  If for any reason in the next week she suddenly feels worse then please give [place] call back and we will treat with an antibiotic if needed at that point.  Otherwise okay to discontinue the cough medicine.  No orders of the defined types were placed in this encounter.   Meds ordered this encounter  Medications   ALPRAZolam (XANAX) 0.25 MG tablet    Sig: TAKE 1 TABLET BY MOUTH AT BEDTIME AS NEEDED FOR SLEEP    Dispense:  30 tablet    Refill:  1    Not to exceed 2 additional fills before 12/21/2020   fluticasone (FLONASE) 50 MCG/ACT nasal spray    Sig: Place 1-2 sprays into both nostrils daily.    Dispense:  16 g    Refill:  12   predniSONE (DELTASONE) 20 MG tablet    Sig: Take 2 tablets (40 mg total) by mouth daily with breakfast.    Dispense:  10 tablet    Refill:  0  I discussed the assessment and treatment plan with the patient. The patient was provided an opportunity to ask questions and all were answered. The patient agreed with the plan and demonstrated an understanding of the instructions.   The patient was advised to call back or seek an in-person evaluation if the symptoms worsen or if the condition fails to improve as anticipated.   Beatrice Lecher, MD

## 2021-01-27 ENCOUNTER — Encounter: Payer: Self-pay | Admitting: Family Medicine

## 2021-02-05 ENCOUNTER — Telehealth: Payer: Self-pay | Admitting: *Deleted

## 2021-02-05 ENCOUNTER — Encounter: Payer: Self-pay | Admitting: Family Medicine

## 2021-02-05 DIAGNOSIS — F418 Other specified anxiety disorders: Secondary | ICD-10-CM

## 2021-02-05 NOTE — Telephone Encounter (Signed)
Pt lvm stating that she would be ok with seeing a therapist here at our office if Dr. Madilyn Fireman is ok with that.

## 2021-02-06 NOTE — Telephone Encounter (Signed)
Patient advised.

## 2021-02-06 NOTE — Telephone Encounter (Signed)
Referral placed.

## 2021-02-06 NOTE — Telephone Encounter (Signed)
OK for [facility] ref for [employer]

## 2021-02-11 ENCOUNTER — Ambulatory Visit: Payer: BC Managed Care – PPO

## 2021-02-11 ENCOUNTER — Other Ambulatory Visit: Payer: Self-pay | Admitting: Family Medicine

## 2021-02-11 ENCOUNTER — Ambulatory Visit (INDEPENDENT_AMBULATORY_CARE_PROVIDER_SITE_OTHER): Payer: BC Managed Care – PPO

## 2021-02-11 ENCOUNTER — Other Ambulatory Visit: Payer: Self-pay

## 2021-02-11 DIAGNOSIS — Z1231 Encounter for screening mammogram for malignant neoplasm of breast: Secondary | ICD-10-CM | POA: Diagnosis not present

## 2021-02-12 NOTE — Progress Notes (Signed)
Please call patient. Normal mammogram.  Repeat in 1 year.  

## 2021-02-19 ENCOUNTER — Encounter: Payer: Self-pay | Admitting: Family Medicine

## 2021-02-19 MED ORDER — FLUCONAZOLE 150 MG PO TABS: 150.0000 mg | ORAL_TABLET | Freq: Once | ORAL | 0 refills | Status: AC

## 2021-02-19 NOTE — Telephone Encounter (Signed)
If not better after treatment, needs appt

## 2021-02-27 ENCOUNTER — Ambulatory Visit (INDEPENDENT_AMBULATORY_CARE_PROVIDER_SITE_OTHER): Payer: BC Managed Care – PPO | Admitting: Physician Assistant

## 2021-02-27 ENCOUNTER — Ambulatory Visit (INDEPENDENT_AMBULATORY_CARE_PROVIDER_SITE_OTHER): Payer: BC Managed Care – PPO

## 2021-02-27 ENCOUNTER — Encounter: Payer: Self-pay | Admitting: Physician Assistant

## 2021-02-27 ENCOUNTER — Other Ambulatory Visit: Payer: Self-pay

## 2021-02-27 VITALS — BP 125/84 | HR 70 | Ht 60.0 in | Wt 146.0 lb

## 2021-02-27 DIAGNOSIS — R1031 Right lower quadrant pain: Secondary | ICD-10-CM

## 2021-02-27 DIAGNOSIS — M25551 Pain in right hip: Secondary | ICD-10-CM | POA: Diagnosis not present

## 2021-02-27 MED ORDER — TRAMADOL HCL 50 MG PO TABS: 50.0000 mg | ORAL_TABLET | Freq: Three times a day (TID) | ORAL | 0 refills | Status: DC | PRN

## 2021-02-27 MED ORDER — DICLOFENAC SODIUM 75 MG PO TBEC: 75.0000 mg | DELAYED_RELEASE_TABLET | Freq: Two times a day (BID) | ORAL | 1 refills | Status: DC

## 2021-02-27 NOTE — Progress Notes (Signed)
Subjective:    Patient ID: Rachel Mack, female    DOB: [birth year], 61 y.o.   MRN: [id number]  HPI Pt is a 61 yo female with hx of lumbar DDD, osteopenia and arthritic pain who presents to the clinic with 1 month of right groin pain. No known injury. Pain is worse at night at end of the day and when she first stands up and starts to walk. Ibuprofen does help but she does not like to take. Pain will radiate down right anterior leg at times. No change in back pain. No bowel or bladder dysfunction. No saddle anesthesia.    Marland Kitchen. Active Ambulatory Problems    Diagnosis Date Noted   Hypothyroidism 04/25/2008   B12 deficiency 12/26/2008   Depression with anxiety 04/25/2008   PERIMENOPAUSAL STATUS 04/25/2008   Pericardial effusion 01/08/2011   Insomnia 11/01/2011   Vitamin D deficiency 01/27/2012   Inverted nipple 02/06/2013   Right hip pain 10/09/2014   Lumbar degenerative disc disease with left-sided foot drop 03/03/2015   Osteopenia 04/18/2015   TFCC (triangular fibrocartilage complex) injury, right, initial encounter 08/23/2016   Hematuria of undiagnosed cause 01/26/2011   Nephrolithiasis 01/26/2011   RBBB 11/10/2016   Other fatigue 11/10/2016   Heberden's nodes 11/10/2016   Abnormal echocardiogram 11/10/2016   Cervical spondylosis 03/16/2019   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/11/2018   Anxiety 12/11/2018   Ganglion cyst of ring finger of right hand 12/06/2019   Right groin pain 02/27/2021   Resolved Ambulatory Problems    Diagnosis Date Noted   SINUSITIS - ACUTE-NOS 06/19/2009   IRREGULAR MENSES 07/31/2008   FOOT PAIN, RIGHT 01/19/2010   FATIGUE, ACUTE 02/10/2010   Essential hypertension, benign 09/16/2010   Low back pain 08/15/2012   Preventative health care 07/27/2013   Urinary tract infectious disease 06/05/2014   Pain and swelling of toe of left foot 10/09/2014   Abdominal pain 01/07/2015   Trochanteric bursitis of left hip 03/03/2015   Eustachian tube dysfunction,  bilateral 11/27/2015   Closed fracture of left distal fibula 04/26/2016   Right wrist pain 04/26/2016   Right rib fracture 04/26/2016   Displaced fracture of hook process of hamate (unciform) bone, right wrist, initial encounter for closed fracture 04/26/2016   Closed fracture of right distal radius 04/26/2016   Hypertension 11/10/2016   Past Medical History:  Diagnosis Date   B12 DEFICIENCY 12/26/2008   DEPRESSION, MILD 04/25/2008   Perimenopausal    Vitamin B 12 deficiency       Review of Systems    See HPI.  Objective:   Physical Exam Vitals reviewed.  Constitutional:      Appearance: Normal appearance.  Musculoskeletal:     Comments: No tenderness over lateral right hip over bursa. No hip flexor tenderness Pain with external ROM and in right groin 5/5 strength lower extermity  Neurological:     Mental Status: She is alert.          Assessment & Plan:  Marland KitchenMarland KitchenStacie was seen today for pain.  Diagnoses and all orders for this visit:  Right groin pain -     DG Hip Unilat W OR W/O Pelvis 2-3 Views Right; Future -     traMADol (ULTRAM) 50 MG tablet; Take 1-2 tablets (50-100 mg total) by mouth every 8 (eight) hours as needed for moderate pain. Maximum 6 tabs per day. -     diclofenac (VOLTAREN) 75 MG EC tablet; Take 1 tablet (75 mg total) by mouth 2 (two) times  daily. -     Ambulatory referral to Physical Therapy  Right hip pain -     DG Hip Unilat W OR W/O Pelvis 2-3 Views Right; Future -     traMADol (ULTRAM) 50 MG tablet; Take 1-2 tablets (50-100 mg total) by mouth every 8 (eight) hours as needed for moderate pain. Maximum 6 tabs per day. -     diclofenac (VOLTAREN) 75 MG EC tablet; Take 1 tablet (75 mg total) by mouth 2 (two) times daily. -     Ambulatory referral to Physical Therapy   Suspect to see arthritis in hip Xray ordered Diclofenac given to use as needed discussed side effects PT ordered Tramadol for break through pain as needed .Marland KitchenPDMP reviewed during  this encounter.  Follow up with Rachel Mack after xray

## 2021-02-27 NOTE — Patient Instructions (Signed)
Hip Exercises °Ask your health care provider which exercises are safe for you. Do exercises exactly as told by your health care provider and adjust them as directed. It is normal to feel mild stretching, pulling, tightness, or discomfort as you do these exercises. Stop right away if you feel sudden pain or your pain gets worse. Do not begin these exercises until told by your health care provider. °Stretching and range-of-motion exercises °These exercises warm up your muscles and joints and improve the movement and flexibility of your hip. These exercises also help to relieve pain, numbness, and tingling. You may be asked to limit your range of motion if you had a hip replacement. Talk to your health care provider about these restrictions. °Hamstrings, supine ° °Lie on your back (supine position). °Loop a belt or towel over the ball of your left / right foot. The ball of your foot is on the walking surface, right under your toes. °Straighten your left / right knee and slowly pull on the belt or towel to raise your leg until you feel a gentle stretch behind your knee (hamstring). °Do not let your knee bend while you do this. °Keep your other leg flat on the floor. °Hold this position for __________ seconds. °Slowly return your leg to the starting position. °Repeat __________ times. Complete this exercise __________ times a day. °Hip rotation ° °Lie on your back on a firm surface. °With your left / right hand, gently pull your left / right knee toward the shoulder that is on the same side of the body. Stop when your knee is pointing toward the ceiling. °Hold your left / right ankle with your other hand. °Keeping your knee steady, gently pull your left / right ankle toward your other shoulder until you feel a stretch in your buttocks. °Keep your hips and shoulders firmly planted while you do this stretch. °Hold this position for __________ seconds. °Repeat __________ times. Complete this exercise __________ times a  day. °Seated stretch °This exercise is sometimes called hamstrings and adductors stretch. °Sit on the floor with your legs stretched wide. Keep your knees straight during this exercise. °Keeping your head and back in a straight line, bend at your waist to reach for your left foot (position A). You should feel a stretch in your right inner thigh (adductors). °Hold this position for __________ seconds. Then slowly return to the upright position. °Keeping your head and back in a straight line, bend at your waist to reach forward (position B). You should feel a stretch behind both of your thighs and knees (hamstrings). °Hold this position for __________ seconds. Then slowly return to the upright position. °Keeping your head and back in a straight line, bend at your waist to reach for your right foot (position C). You should feel a stretch in your left inner thigh (adductors). °Hold this position for __________ seconds. Then slowly return to the upright position. °Repeat __________ times. Complete this exercise __________ times a day. °Lunge °This exercise stretches the muscles of the hip (hip flexors). °Place your left / right knee on the floor and bend your other knee so that is directly over your ankle. You should be half-kneeling. °Keep good posture with your head over your shoulders. °Tighten your buttocks to point your tailbone downward. This will prevent your back from arching too much. °You should feel a gentle stretch in the front of your left / right thigh and hip. If you do not feel a stretch, slide your other foot   forward slightly and then slowly lunge forward with your chest up until your knee once again lines up over your ankle. °Make sure your tailbone continues to point downward. °Hold this position for __________ seconds. °Slowly return to the starting position. °Repeat __________ times. Complete this exercise __________ times a day. °Strengthening exercises °These exercises build strength and endurance  in your hip. Endurance is the ability to use your muscles for a long time, even after they get tired. °Bridge °This exercise strengthens the muscles of your hip (hip extensors). °Lie on your back on a firm surface with your knees bent and your feet flat on the floor. °Tighten your buttocks muscles and lift your bottom off the floor until the trunk of your body and your hips are level with your thighs. °Do not arch your back. °You should feel the muscles working in your buttocks and the back of your thighs. If you do not feel these muscles, slide your feet 1-2 inches (2.5-5 cm) farther away from your buttocks. °Hold this position for __________ seconds. °Slowly lower your hips to the starting position. °Let your muscles relax completely between repetitions. °Repeat __________ times. Complete this exercise __________ times a day. °Straight leg raises, side-lying °This exercise strengthens the muscles that move the hip joint away from the center of the body (hip abductors). °Lie on your side with your left / right leg in the top position. Lie so your head, shoulder, hip, and knee line up. You may bend your bottom knee slightly to help you balance. °Roll your hips slightly forward, so your hips are stacked directly over each other and your left / right knee is facing forward. °Leading with your heel, lift your top leg 4-6 inches (10-15 cm). You should feel the muscles in your top hip lifting. °Do not let your foot drift forward. °Do not let your knee roll toward the ceiling. °Hold this position for __________ seconds. °Slowly return to the starting position. °Let your muscles relax completely between repetitions. °Repeat __________ times. Complete this exercise __________ times a day. °Straight leg raises, side-lying °This exercise strengthens the muscles that move the hip joint toward the center of the body (hip adductors). °Lie on your side with your left / right leg in the bottom position. Lie so your head, shoulder,  hip, and knee line up. You may place your upper foot in front to help you balance. °Roll your hips slightly forward, so your hips are stacked directly over each other and your left / right knee is facing forward. °Tense the muscles in your inner thigh and lift your bottom leg 4-6 inches (10-15 cm). °Hold this position for __________ seconds. °Slowly return to the starting position. °Let your muscles relax completely between repetitions. °Repeat __________ times. Complete this exercise __________ times a day. °Straight leg raises, supine °This exercise strengthens the muscles in the front of your thigh (quadriceps). °Lie on your back (supine position) with your left / right leg extended and your other knee bent. °Tense the muscles in the front of your left / right thigh. You should see your kneecap slide up or see increased dimpling just above your knee. °Keep these muscles tight as you raise your leg 4-6 inches (10-15 cm) off the floor. Do not let your knee bend. °Hold this position for __________ seconds. °Keep these muscles tense as you lower your leg. °Relax the muscles slowly and completely between repetitions. °Repeat __________ times. Complete this exercise __________ times a day. °Hip abductors, standing °This   exercise strengthens the muscles that move the leg and hip joint away from the center of the body (hip abductors). °Tie one end of a rubber exercise band or tubing to a secure surface, such as a chair, table, or pole. °Loop the other end of the band or tubing around your left / right ankle. °Keeping your ankle with the band or tubing directly opposite the secured end, step away until there is tension in the tubing or band. Hold on to a chair, table, or pole as needed for balance. °Lift your left / right leg out to your side. While you do this: °Keep your back upright. °Keep your shoulders over your hips. °Keep your toes pointing forward. °Make sure to use your hip muscles to slowly lift your leg. Do not  tip your body or forcefully lift your leg. °Hold this position for __________ seconds. °Slowly return to the starting position. °Repeat __________ times. Complete this exercise __________ times a day. °Squats °This exercise strengthens the muscles in the front of your thigh (quadriceps). °Stand in a door frame so your feet and knees are in line with the frame. You may place your hands on the frame for balance. °Slowly bend your knees and lower your hips like you are going to sit in a chair. °Keep your lower legs in a straight-up-and-down position. °Do not let your hips go lower than your knees. °Do not bend your knees lower than told by your health care provider. °If your hip pain increases, do not bend as low. °Hold this position for ___________ seconds. °Slowly push with your legs to return to standing. Do not use your hands to pull yourself to standing. °Repeat __________ times. Complete this exercise __________ times a day. °This information is not intended to replace advice given to you by your health care provider. Make sure you discuss any questions you have with your health care provider. °Document Revised: 06/25/2020 Document Reviewed: 06/25/2020 °Elsevier Patient Education © 2022 [employer]vier Inc. ° °

## 2021-03-04 ENCOUNTER — Ambulatory Visit: Payer: BC Managed Care – PPO | Admitting: Sports Medicine

## 2021-03-04 NOTE — Progress Notes (Signed)
Surprisingly no significant arthritic changes of the hip.  Have you been contacted by PT?  If so start PT and see if any improvement. If not we need to think about MRI if not improving.

## 2021-03-08 ENCOUNTER — Other Ambulatory Visit: Payer: Self-pay | Admitting: Family Medicine

## 2021-03-22 ENCOUNTER — Encounter: Payer: Self-pay | Admitting: Family Medicine

## 2021-03-22 DIAGNOSIS — F5101 Primary insomnia: Secondary | ICD-10-CM

## 2021-03-23 MED ORDER — ALPRAZOLAM 0.25 MG PO TABS: ORAL_TABLET | ORAL | 1 refills | Status: DC

## 2021-03-23 NOTE — Telephone Encounter (Signed)
I can send it but she will have to understand that I do not know what manufacturer makes that particular 1 I can put a request to the pharmacist for football shaped but again without more specifics I cannot guarantee that she is not getting get the same 1.  So she may want to actually find out what generic that was and then I can specifically request that generic.  No I did go ahead and send the prescription today.

## 2021-04-02 ENCOUNTER — Encounter: Payer: Self-pay | Admitting: Professional

## 2021-04-02 ENCOUNTER — Ambulatory Visit (INDEPENDENT_AMBULATORY_CARE_PROVIDER_SITE_OTHER): Payer: BC Managed Care – PPO | Admitting: Professional

## 2021-04-02 ENCOUNTER — Other Ambulatory Visit: Payer: Self-pay

## 2021-04-02 DIAGNOSIS — F4323 Adjustment disorder with mixed anxiety and depressed mood: Secondary | ICD-10-CM | POA: Diagnosis not present

## 2021-04-02 DIAGNOSIS — F4522 Body dysmorphic disorder: Secondary | ICD-10-CM
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
## 2021-04-02 NOTE — Progress Notes (Signed)
° ° ° ° ° ° ° ° ° ° ° ° ° ° °  Araya Roel, LCMHC °

## 2021-05-05 ENCOUNTER — Other Ambulatory Visit: Payer: Self-pay | Admitting: Physician Assistant

## 2021-05-05 DIAGNOSIS — M25551 Pain in right hip: Secondary | ICD-10-CM

## 2021-05-05 DIAGNOSIS — R1031 Right lower quadrant pain: Secondary | ICD-10-CM

## 2021-05-08 ENCOUNTER — Ambulatory Visit (INDEPENDENT_AMBULATORY_CARE_PROVIDER_SITE_OTHER): Payer: BC Managed Care – PPO | Admitting: Professional

## 2021-05-08 ENCOUNTER — Encounter: Payer: Self-pay | Admitting: Professional

## 2021-05-08 DIAGNOSIS — F4323 Adjustment disorder with mixed anxiety and depressed mood: Secondary | ICD-10-CM

## 2021-05-08 NOTE — Progress Notes (Signed)
° ° ° ° ° ° ° ° ° ° ° ° ° ° °  Adelfa Lozito, LCMHC °

## 2021-05-08 NOTE — Progress Notes (Signed)
[place] Counselor/Therapist Progress Note ? ?Patient ID: Rachel Mack, MRN: [id number],   ? ?Date: 05/08/2021 ? ?Time/Length of session: 53 minutes 1212-108 pm ? ?Risk Assessment: ?Danger to Self:  No ?Self-injurious Behavior: No ?Danger to Others: No ? ?Subjective: The patient arrived on time for her in-person appointment appearing casually groomed. ? ?Issues addressed; ?1-treatment plan ?-developed plan with patient  ?-patient fully participated in the development of her treatment plan and agrees with the plan ?2-self-esteem ?-negative self-talk ?  -educated ?  -increase mindfulness around what she says about her importance ? ?Treatment Plan ?Problems Addressed  ?Low Self-Esteem, Unipolar Depression ?Goals ?1. Demonstrate improved self-esteem through more pride in appearance, more assertiveness, greater eye contact, and identification of positive traits in self-talk messages. ?2. Develop a consistent, positive self-image. ?3. Develop healthy thinking patterns and beliefs about self, others, and the world that lead to the alleviation and help prevent the relapse of depression. ?Objective ?Verbalize an understanding of healthy and unhealthy emotions with the intent of increasing the use of healthy emotions to guide actions. ?Target Date: 2022-05-08 Frequency: Weekly  ?Progress: 0 Modality: individual  ?Related Interventions ?Use a process-experiential approach consistent with Emotion-Focused Therapy to create a safe, nurturing environment in which the client can process emotions, learning to identify and regulate unhealthy feelings and to generate more adaptive ones that then guide actions (see Emotion-Focused Therapy for Depression by Andrey Spearman). ?Objective ?Increasingly verbalize hopeful and positive statements regarding self, others, and the future. ?Target Date: 2022-05-08 Frequency: Weekly  ?Progress: 0 Modality: individual  ?Related Interventions ?Teach the client more about  depression and how to recognize and accept some sadness as a normal variation in feeling. ?Assign the client to write at least one positive affirmation statement daily regarding himself/herself and the future (or assign "Positive Self-Talk" in the Adult Psychotherapy Homework Planner by Bryn Gulling). ?4. Elevate self-esteem. ?Objective ?Identify and replace negative self-talk messages used to reinforce low self-esteem. ?Target Date: 2022-05-08 Frequency: Weekly  ?Progress: 0 Modality: individual  ?Related Interventions ?Help the client identify his/her distorted, negative beliefs about self and the world and replace these messages with more realistic, affirmative messages (or assign "Journal and Replace Self-Defeating Thoughts" in the Adult Psychotherapy Homework Planner by [facility] or read What to Say When You Talk to Yourself by [employer]). ?Ask the client to complete and process self-esteem-building exercises from recommended self-help books (e.g., Ten Days to Self Esteem! by Quay Burow; The Self-Esteem Companion by Rexene Alberts, [place], and [employer]; 10 Simple Solutions for [employer] by [employer]). ?Objective ?Identify any secondary gain that is received by speaking negatively about self and refusing to take any risks. ?Target Date: 2022-05-08 Frequency: Weekly  ?Progress: 0 Modality: individual  ?Related Interventions ?Teach the client the meaning and power of secondary gain in maintaining negative behavior patterns. ?Assist the client in identifying how self-disparagement and avoidance of risk-taking could bring secondary gain (e.g., praise from others, others taking over responsibilities). ?Objective ?Identify and engage in activities that would improve self-image by being consistent with one's values. ?Target Date: 2022-05-08 Frequency: Weekly  ?Progress: 0 Modality: individual  ?Related Interventions ?Help the client analyze his/her values and the congruence or incongruence between them and the  client's daily activities. ?Identify and assign activities congruent with the client's values; process them toward improving self-concept and self-esteem. ?Objective ?Identify positive traits and talents about self. ?Target Date: 2022-05-08 Frequency: Weekly  ?Progress: 0 Modality: individual  ?Related Interventions ?Assign the client the exercise of identifying his/her positive physical  characteristics in a mirror to help him/her become more comfortable with himself/herself. ?Ask the client to keep building a list of positive traits and have him/her read the list at the beginning and end of each session (or assign "Acknowledging My Strengths" or "What Are My Good Qualities?" in the Adult Psychotherapy Homework Planner by [facility]); reinforce the client's positive self-descriptive statements. ?Objective ?Positively acknowledge verbal compliments from others. ?Target Date: 2022-05-08 Frequency: Weekly  ?Progress: 0 Modality: individual  ?Related Interventions ?Assign the client to be aware of and acknowledge graciously (without discounting) praise and compliments from others. ?5. Establish an inward sense of self-worth, confidence, and competence. ? ?Diagnosis:Adjustment disorder with mixed anxiety and depressed mood ? ?Plan:  ?-meet again on Friday, May 15, 2021 at 11am ? ?Francie Massing, [facility] ? ? ? ?

## 2021-05-15 ENCOUNTER — Ambulatory Visit: Payer: BC Managed Care – PPO | Admitting: Professional

## 2021-05-21 ENCOUNTER — Encounter: Payer: Self-pay | Admitting: Professional

## 2021-05-21 ENCOUNTER — Ambulatory Visit (INDEPENDENT_AMBULATORY_CARE_PROVIDER_SITE_OTHER): Payer: BC Managed Care – PPO | Admitting: Professional

## 2021-05-21 DIAGNOSIS — F4323 Adjustment disorder with mixed anxiety and depressed mood: Secondary | ICD-10-CM

## 2021-05-21 NOTE — Progress Notes (Signed)
[place] Counselor/Therapist Progress Note ? ?Patient ID: Rachel Mack, MRN: [id number],   ? ?Date: 05/21/2021 ? ?Time/Length of session: 52 minutes 1102-1154 am ? ?Risk Assessment: ?Danger to Self:  No ?Self-injurious Behavior: No ?Danger to Others: No ? ?Subjective: The patient arrived on time for her in-person appointment appearing casually groomed. ? ?Issues addressed; ?1-work ?-feeling stressed due to backbiting by women at work ?-felt concerned that the director of nursing asking her about being on restriction when this was not the case ?  -pt felt attacked, however, it appears the DON was looking to clarify information ?  -pt was worried that she potentially got someone else in trouble ?2-self-esteem ?-patient identified in writing several times she was being negative but could not recall in session ?-discussed how to change thinking ?  -examples ?-discussed messages pt was giving herself ?-overview of acknowledging my strengths with pt ?  -pt provided instructions on how to complete worksheet ?-internal focus vs. external focus ?  -pt admits to be internally focused and thinks about herself a lot ?  -worries about what other people are thinking/saying about her ?-how to become involved via an external focus ?  -develop relationships ?  -recognition that she is acceptable as she is ? ?Treatment Plan ?Problems Addressed  ?Low Self-Esteem, Unipolar Depression ?Goals ?1. Demonstrate improved self-esteem through more pride in appearance, more assertiveness, greater eye contact, and identification of positive traits in self-talk messages. ?2. Develop a consistent, positive self-image. ?3. Develop healthy thinking patterns and beliefs about self, others, and the world that lead to the alleviation and help prevent the relapse of depression. ?Objective ?Verbalize an understanding of healthy and unhealthy emotions with the intent of increasing the use of healthy emotions to guide actions. ?Target  Date: 2022-05-08 Frequency: Weekly  ?Progress: 0 Modality: individual  ?Related Interventions ?Use a process-experiential approach consistent with Emotion-Focused Therapy to create a safe, nurturing environment in which the client can process emotions, learning to identify and regulate unhealthy feelings and to generate more adaptive ones that then guide actions (see Emotion-Focused Therapy for Depression by Andrey Spearman). ?Objective ?Increasingly verbalize hopeful and positive statements regarding self, others, and the future. ?Target Date: 2022-05-08 Frequency: Weekly  ?Progress: 0 Modality: individual  ?Related Interventions ?Teach the client more about depression and how to recognize and accept some sadness as a normal variation in feeling. ?Assign the client to write at least one positive affirmation statement daily regarding himself/herself and the future (or assign "Positive Self-Talk" in the Adult Psychotherapy Homework Planner by Bryn Gulling). ?4. Elevate self-esteem. ?Objective ?Identify and replace negative self-talk messages used to reinforce low self-esteem. ?Target Date: 2022-05-08 Frequency: Weekly  ?Progress: 0 Modality: individual  ?Related Interventions ?Help the client identify his/her distorted, negative beliefs about self and the world and replace these messages with more realistic, affirmative messages (or assign "Journal and Replace Self-Defeating Thoughts" in the Adult Psychotherapy Homework Planner by [facility] or read What to Say When You Talk to Yourself by [employer]). ?Ask the client to complete and process self-esteem-building exercises from recommended self-help books (e.g., Ten Days to Self Esteem! by Quay Burow; The Self-Esteem Companion by Rexene Alberts, [place], and [employer]; 10 Simple Solutions for [employer] by [employer]). ?Objective ?Identify any secondary gain that is received by speaking negatively about self and refusing to take any risks. ?Target Date: 2022-05-08  Frequency: Weekly  ?Progress: 0 Modality: individual  ?Related Interventions ?Teach the client the meaning and power of secondary gain in maintaining negative behavior  patterns. ?Assist the client in identifying how self-disparagement and avoidance of risk-taking could bring secondary gain (e.g., praise from others, others taking over responsibilities). ?Objective ?Identify and engage in activities that would improve self-image by being consistent with one's values. ?Target Date: 2022-05-08 Frequency: Weekly  ?Progress: 0 Modality: individual  ?Related Interventions ?Help the client analyze his/her values and the congruence or incongruence between them and the client's daily activities. ?Identify and assign activities congruent with the client's values; process them toward improving self-concept and self-esteem. ?Objective ?Identify positive traits and talents about self. ?Target Date: 2022-05-08 Frequency: Weekly  ?Progress: 0 Modality: individual  ?Related Interventions ?Assign the client the exercise of identifying his/her positive physical characteristics in a mirror to help him/her become more comfortable with himself/herself. ?Ask the client to keep building a list of positive traits and have him/her read the list at the beginning and end of each session (or assign "Acknowledging My Strengths" or "What Are My Good Qualities?" in the Adult Psychotherapy Homework Planner by [facility]); reinforce the client's positive self-descriptive statements. ?Objective ?Positively acknowledge verbal compliments from others. ?Target Date: 2022-05-08 Frequency: Weekly  ?Progress: 0 Modality: individual  ?Related Interventions ?Assign the client to be aware of and acknowledge graciously (without discounting) praise and compliments from others. ?5. Establish an inward sense of self-worth, confidence, and competence. ? ?Diagnosis:Adjustment disorder with mixed anxiety and depressed mood ? ?Plan:  ?-meet again on Tuesday, May 26, 2021 at 1pm ? ?Francie Massing, [facility] ?

## 2021-05-26 ENCOUNTER — Ambulatory Visit (INDEPENDENT_AMBULATORY_CARE_PROVIDER_SITE_OTHER): Payer: BC Managed Care – PPO | Admitting: Professional

## 2021-05-26 ENCOUNTER — Encounter: Payer: Self-pay | Admitting: Professional

## 2021-05-26 DIAGNOSIS — F4522 Body dysmorphic disorder: Secondary | ICD-10-CM

## 2021-05-26 DIAGNOSIS — F4323 Adjustment disorder with mixed anxiety and depressed mood: Secondary | ICD-10-CM

## 2021-05-26 NOTE — Progress Notes (Signed)
[place] Counselor/Therapist Progress Note ? ?Patient ID: Rachel Mack, MRN: [id number],   ? ?Date: 05/26/2021 ? ?Time/Length of session:  54 minutes 102-156pm ? ?Risk Assessment: ?Danger to Self:  No ?Self-injurious Behavior: No ?Danger to Others: No ? ?Subjective: This session was held via video teletherapy due to the coronavirus risk at this time. The patient consented to video teletherapy and was located at her home during this session. She is aware it is the responsibility of the patient to secure confidentiality on her end of the session. The provider was in a private home office for the duration of this session.  ? ?The patient arrived on time for her virtual appointment. ? ?Issues addressed; ?1-work ?-she has worked seven days in a row ?-she gave in and worked seven days ?-has been asked to come in to work today ?  -making an eight day ?-challenged pt to consider at what point she will say no ?  -I think I'm choosing not to ?2-self-esteem ?-has not had time to work on anything  ?-has not had time to complete her homework yet ?-her trying to fit in gets her stuck the most ?-asking herself it she owns any of the issues ?-when she is not there for a few days the residents ask her where she was ?-loyalty to herself before work ?-daily makes self-disparaging remarks ?-therapist body dysmorphia ? ?Treatment Plan ?Problems Addressed  ?Low Self-Esteem, Unipolar Depression ?Goals ?1. Demonstrate improved self-esteem through more pride in appearance, more assertiveness, greater eye contact, and identification of positive traits in self-talk messages. ?2. Develop a consistent, positive self-image. ?3. Develop healthy thinking patterns and beliefs about self, others, and the world that lead to the alleviation and help prevent the relapse of depression. ?Objective ?Verbalize an understanding of healthy and unhealthy emotions with the intent of increasing the use of healthy emotions to guide  actions. ?Target Date: 2022-05-08 Frequency: Weekly  ?Progress: 0 Modality: individual  ?Related Interventions ?Use a process-experiential approach consistent with Emotion-Focused Therapy to create a safe, nurturing environment in which the client can process emotions, learning to identify and regulate unhealthy feelings and to generate more adaptive ones that then guide actions (see Emotion-Focused Therapy for Depression by Andrey Spearman). ?Objective ?Increasingly verbalize hopeful and positive statements regarding self, others, and the future. ?Target Date: 2022-05-08 Frequency: Weekly  ?Progress: 0 Modality: individual  ?Related Interventions ?Teach the client more about depression and how to recognize and accept some sadness as a normal variation in feeling. ?Assign the client to write at least one positive affirmation statement daily regarding himself/herself and the future (or assign "Positive Self-Talk" in the Adult Psychotherapy Homework Planner by Bryn Gulling). ?4. Elevate self-esteem. ?Objective ?Identify and replace negative self-talk messages used to reinforce low self-esteem. ?Target Date: 2022-05-08 Frequency: Weekly  ?Progress: 0 Modality: individual  ?Related Interventions ?Help the client identify his/her distorted, negative beliefs about self and the world and replace these messages with more realistic, affirmative messages (or assign "Journal and Replace Self-Defeating Thoughts" in the Adult Psychotherapy Homework Planner by [facility] or read What to Say When You Talk to Yourself by [employer]). ?Ask the client to complete and process self-esteem-building exercises from recommended self-help books (e.g., Ten Days to Self Esteem! by Quay Burow; The Self-Esteem Companion by Rexene Alberts, [place], and [employer]; 10 Simple Solutions for [employer] by [employer]). ?Objective ?Identify any secondary gain that is received by speaking negatively about self and refusing to take any risks. ?Target  Date: 2022-05-08 Frequency: Weekly  ?  Progress: 0 Modality: individual  ?Related Interventions ?Teach the client the meaning and power of secondary gain in maintaining negative behavior patterns. ?Assist the client in identifying how self-disparagement and avoidance of risk-taking could bring secondary gain (e.g., praise from others, others taking over responsibilities). ?Objective ?Identify and engage in activities that would improve self-image by being consistent with one's values. ?Target Date: 2022-05-08 Frequency: Weekly  ?Progress: 0 Modality: individual  ?Related Interventions ?Help the client analyze his/her values and the congruence or incongruence between them and the client's daily activities. ?Identify and assign activities congruent with the client's values; process them toward improving self-concept and self-esteem. ?Objective ?Identify positive traits and talents about self. ?Target Date: 2022-05-08 Frequency: Weekly  ?Progress: 0 Modality: individual  ?Related Interventions ?Assign the client the exercise of identifying his/her positive physical characteristics in a mirror to help him/her become more comfortable with himself/herself. ?Ask the client to keep building a list of positive traits and have him/her read the list at the beginning and end of each session (or assign "Acknowledging My Strengths" or "What Are My Good Qualities?" in the Adult Psychotherapy Homework Planner by [facility]); reinforce the client's positive self-descriptive statements. ?Objective ?Positively acknowledge verbal compliments from others. ?Target Date: 2022-05-08 Frequency: Weekly  ?Progress: 0 Modality: individual  ?Related Interventions ?Assign the client to be aware of and acknowledge graciously (without discounting) praise and compliments from others. ?5. Establish an inward sense of self-worth, confidence, and competence. ? ?Diagnosis:Adjustment disorder with mixed anxiety and depressed mood ? 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 ?Plan:  ?-pt to research body image support groups ?-Clinician to research body dysmorphia specialists in [place] and [place], [place] ?-meet again on Thursday, June 11, 2021 at 11am in-person ? ?Francie Massing, [facility] ? ?

## 2021-05-28 ENCOUNTER — Other Ambulatory Visit: Payer: Self-pay | Admitting: Family Medicine

## 2021-05-28 DIAGNOSIS — E538 Deficiency of other specified B group vitamins: Secondary | ICD-10-CM

## 2021-06-04 ENCOUNTER — Ambulatory Visit: Payer: BC Managed Care – PPO | Admitting: Professional

## 2021-06-11 ENCOUNTER — Ambulatory Visit: Payer: BC Managed Care – PPO | Admitting: Professional

## 2021-06-18 ENCOUNTER — Ambulatory Visit: Payer: BC Managed Care – PPO | Admitting: Professional

## 2021-07-17 ENCOUNTER — Other Ambulatory Visit: Payer: Self-pay | Admitting: Family Medicine

## 2021-07-17 DIAGNOSIS — F5101 Primary insomnia: Secondary | ICD-10-CM

## 2021-07-21 ENCOUNTER — Encounter: Payer: Self-pay | Admitting: Family Medicine

## 2021-07-21 NOTE — Telephone Encounter (Signed)
Med filled but needs a f/u appt.  Ok to schedule for July.

## 2021-07-21 NOTE — Telephone Encounter (Signed)
Patient has been scheduled for 08/28/21. AMUCk

## 2021-08-28 ENCOUNTER — Encounter: Payer: Self-pay | Admitting: Family Medicine

## 2021-08-28 ENCOUNTER — Ambulatory Visit: Payer: BC Managed Care – PPO | Admitting: Family Medicine

## 2021-08-28 VITALS — BP 124/69 | HR 70 | Resp 16 | Ht 60.0 in | Wt 143.0 lb

## 2021-08-28 DIAGNOSIS — Z79890 Hormone replacement therapy (postmenopausal): Secondary | ICD-10-CM | POA: Diagnosis not present

## 2021-08-28 DIAGNOSIS — F4323 Adjustment disorder with mixed anxiety and depressed mood: Secondary | ICD-10-CM | POA: Diagnosis not present

## 2021-08-28 DIAGNOSIS — E038 Other specified hypothyroidism: Secondary | ICD-10-CM

## 2021-08-28 DIAGNOSIS — R253 Fasciculation: Secondary | ICD-10-CM

## 2021-08-28 DIAGNOSIS — F5101 Primary insomnia: Secondary | ICD-10-CM

## 2021-08-28 DIAGNOSIS — Z13220 Encounter for screening for lipoid disorders: Secondary | ICD-10-CM

## 2021-08-28 DIAGNOSIS — R252 Cramp and spasm: Secondary | ICD-10-CM

## 2021-08-28 MED ORDER — ANGELIQ 0.25-0.5 MG PO TABS: 1.0000 | ORAL_TABLET | ORAL | 0 refills | Status: DC

## 2021-08-28 MED ORDER — ALPRAZOLAM 0.25 MG PO TABS: ORAL_TABLET | ORAL | 0 refills | Status: DC

## 2021-08-28 NOTE — Assessment & Plan Note (Signed)
Due to recheck TSH. 

## 2021-08-28 NOTE — Assessment & Plan Note (Signed)
Doing well on the Angelique.  Mostly for vaginal dryness takes 1 tab every other day.  Encouraged her to try going down to every 3 days and see if that still control symptoms.  We will discuss switching to topical estrogen when I see her back in 6 months.

## 2021-08-28 NOTE — Assessment & Plan Note (Signed)
Continue current regimen of half tab daily of alprazolam.

## 2021-08-28 NOTE — Assessment & Plan Note (Signed)
Most of the time she does take a half a tab of the Xanax.  Occasionally alcohol.

## 2021-08-28 NOTE — Progress Notes (Signed)
Established Patient Office Visit  Subjective   Patient ID: Rachel Mack, female    DOB: 10-Jun-1960  Age: 61 y.o. MRN: [id number]  Chief Complaint  Patient presents with   Medication Follow up     Refill Xanax   Spasms    Patient complains of muscle spasms in the bottom of her legs for several months.     HPI  Hypothyroidism - Taking medication regularly in the AM away from food and vitamins, etc. No recent change to skin, hair, or energy levels.  F/U Anxiety -   F/U HRT - she takes the Angeliq every other day.  She primarily takes it for vaginal dryness.  She says it really does not help with sex drive.  But that she has been tolerating it well without any side effects or problems.  She reports she is also been experiencing a lot more cramping than usual particularly at night more so in her right lower leg sometimes though her left is affected.  She does not go barefoot.  She tends to drink plenty of water.  Is also been experiencing some muscles painless muscle spasms mostly often in the lower legs.  Again not sure what is triggering those.    ROS    Objective:     BP 124/69   Pulse 70   Resp 16   Ht 5' (1.524 m)   Wt 143 lb (64.9 kg)   LMP 04/23/2014   SpO2 100%   BMI 27.93 kg/m    Physical Exam Vitals and nursing note reviewed.  Constitutional:      Appearance: She is well-developed.  HENT:     Head: Normocephalic and atraumatic.  Cardiovascular:     Rate and Rhythm: Normal rate and regular rhythm.     Heart sounds: Normal heart sounds.  Pulmonary:     Effort: Pulmonary effort is normal.     Breath sounds: Normal breath sounds.  Skin:    General: Skin is warm and dry.  Neurological:     Mental Status: She is alert and oriented to person, place, and time.  Psychiatric:        Behavior: Behavior normal.     No results found for any visits on 08/28/21.    The 10-year ASCVD risk score (Arnett DK, et al., 2019) is: 2.1%    Assessment &  Plan:   Problem List Items Addressed This Visit       Endocrine   Hypothyroidism - Primary   Relevant Orders   Lipid Panel w/reflex Direct LDL   COMPLETE METABOLIC PANEL WITH GFR   CBC     Other   Insomnia (Chronic)    Most of the time she does take a half a tab of the Xanax.  Occasionally alcohol.      Relevant Medications   ALPRAZolam (XANAX) 0.25 MG tablet   Hormone replacement therapy (HRT)    Doing well on the Angelique.  Mostly for vaginal dryness takes 1 tab every other day.  Encouraged her to try going down to every 3 days and see if that still control symptoms.  We will discuss switching to topical estrogen when I see her back in 6 months.      Relevant Medications   Drospirenone-Estradiol (ANGELIQ) 0.25-0.5 MG TABS   Adjustment disorder with mixed anxiety and depressed mood    Continue current regimen of half tab daily of alprazolam.      Other Visit Diagnoses     Screening,  lipid       Relevant Orders   Lipid Panel w/reflex Direct LDL   Leg cramps       Relevant Orders   CK (Creatine Kinase)   Magnesium   Fasciculations           Muscle cramps-we discussed wearing good supportive shoe wear, staying hydrated, will check potassium, magnesium and thyroid levels and will make any adjustments needed.  Fasciculations-what she is describing sound like benign fasciculations.  We discussed that these are most often harmless even though they can be annoying.  Did encourage her to let me know if at any point she experiences pain or weakness in the muscles  Return in about 6 months (around 02/28/2022) for HRT and sleep.    Beatrice Lecher, MD

## 2021-08-29 ENCOUNTER — Encounter: Payer: Self-pay | Admitting: Family Medicine

## 2021-08-29 LAB — COMPLETE METABOLIC PANEL WITH GFR
AG Ratio: 2.1 (calc) (ref 1.0–2.5)
ALT: 17 U/L (ref 6–29)
AST: 18 U/L (ref 10–35)
Albumin: 5.1 g/dL (ref 3.6–5.1)
Alkaline phosphatase (APISO): 42 U/L (ref 37–153)
BUN: 13 mg/dL (ref 7–25)
CO2: 26 mmol/L (ref 20–32)
Calcium: 9.9 mg/dL (ref 8.6–10.4)
Chloride: 106 mmol/L (ref 98–110)
Creat: 0.64 mg/dL (ref 0.50–1.03)
Globulin: 2.4 g/dL (calc) (ref 1.9–3.7)
Glucose, Bld: 76 mg/dL (ref 65–99)
Potassium: 3.9 mmol/L (ref 3.5–5.3)
Sodium: 142 mmol/L (ref 135–146)
Total Bilirubin: 0.4 mg/dL (ref 0.2–1.2)
Total Protein: 7.5 g/dL (ref 6.1–8.1)
eGFR: 102 mL/min/{1.73_m2} (ref >=60)

## 2021-08-29 LAB — CK: Total CK: 52 U/L (ref 29–143)

## 2021-08-29 LAB — CBC
HCT: 42 % (ref 35.0–45.0)
Hemoglobin: 14.2 g/dL (ref 11.7–15.5)
MCH: 33.3 pg — ABNORMAL HIGH (ref 27.0–33.0)
MCHC: 33.8 g/dL (ref 32.0–36.0)
MCV: 98.6 fL (ref 80.0–100.0)
MPV: 11.8 fL (ref 7.5–12.5)
Platelets: 236 10*3/uL (ref 140–400)
RBC: 4.26 10*6/uL (ref 3.80–5.10)
RDW: 12.3 % (ref 11.0–15.0)
WBC: 6.3 10*3/uL (ref 3.8–10.8)

## 2021-08-29 LAB — LIPID PANEL W/REFLEX DIRECT LDL
Cholesterol: 190 mg/dL (ref <200)
HDL: 63 mg/dL (ref >=50)
LDL Cholesterol (Calc): 96 mg/dL (calc)
Non-HDL Cholesterol (Calc): 127 mg/dL (calc) (ref <130)
Total CHOL/HDL Ratio: 3 (calc) (ref <5.0)
Triglycerides: 212 mg/dL — ABNORMAL HIGH (ref <150)

## 2021-08-29 LAB — MAGNESIUM: Magnesium: 2.4 mg/dL (ref 1.5–2.5)

## 2021-08-30 ENCOUNTER — Other Ambulatory Visit: Payer: Self-pay | Admitting: Family Medicine

## 2021-09-01 NOTE — Progress Notes (Signed)
Hi Rachel Mack, total cholesterol and LDL look good.  Triglycerides are up though.  So just encourage you to continue work on healthy diet and regular exercise.  Metabolic panel including liver and kidney function is good.  No anemia.  Muscle enzyme level is normal so no sign of excess breakdown.  Magnesium level is normal.  Continue to work on hydrating and stretches before bedtime to help with cramping.  You can also do a trial of vitamin B6.  There is some evidence that that can be helpful as well.

## 2021-09-15 ENCOUNTER — Encounter: Payer: Self-pay | Admitting: Family Medicine

## 2021-09-21 ENCOUNTER — Encounter: Payer: Self-pay | Admitting: Family Medicine

## 2021-10-14 ENCOUNTER — Encounter: Payer: Self-pay | Admitting: Family Medicine

## 2021-10-16 NOTE — Telephone Encounter (Signed)
Verify with her if this is the BRCA gene testing for breast and colon cancer risk?

## 2021-10-22 NOTE — Telephone Encounter (Signed)
JJ can you order Genesight to evalu for psych meds. Thank you!!

## 2021-10-23 NOTE — Telephone Encounter (Signed)
Genesight testing submitted via website. Approval form signed per [facility] and faxed to them with confirmation received. They should ship collection kit to patient directly.

## 2021-11-02 ENCOUNTER — Telehealth: Payer: Self-pay | Admitting: Family Medicine

## 2021-11-02 NOTE — Telephone Encounter (Signed)
Call patient and let her know I did receive her GeneSight testing results.  I am happy to go over those in detail.  It looks like the medications that should work best well for her are some of the newer ones such as Pristiq, Fetzima, Viibryd, or Trintellix, there is also some benefit with Wellbutrin as well.  Again I am happy to discuss options with her if she would like to schedule a visit okay for virtual if desired.

## 2021-11-03 NOTE — Telephone Encounter (Signed)
Pt advised and pt transferred to scheduling

## 2021-11-05 ENCOUNTER — Encounter: Payer: Self-pay | Admitting: Family Medicine

## 2021-11-05 ENCOUNTER — Telehealth (INDEPENDENT_AMBULATORY_CARE_PROVIDER_SITE_OTHER): Payer: BC Managed Care – PPO | Admitting: Family Medicine

## 2021-11-05 DIAGNOSIS — F5101 Primary insomnia: Secondary | ICD-10-CM

## 2021-11-05 DIAGNOSIS — F418 Other specified anxiety disorders: Secondary | ICD-10-CM | POA: Diagnosis not present

## 2021-11-05 DIAGNOSIS — Z79890 Hormone replacement therapy (postmenopausal): Secondary | ICD-10-CM

## 2021-11-05 NOTE — Progress Notes (Signed)
Virtual Visit via Video Note  I connected with Rachel Mack on 11/05/21 at  1:00 PM EDT by a video enabled telemedicine application and verified that I am speaking with the correct person using two identifiers.   I discussed the limitations of evaluation and management by telemedicine and the availability of in person appointments. The patient expressed understanding and agreed to proceed.  Patient location: at home Provider location: in office  Subjective:    CC:   Chief Complaint  Patient presents with   Results    HPI:  Go over Gene sight testing.       11/05/2021   11:23 AM 08/28/2021    1:58 PM 05/26/2021    1:12 PM  Depression screen PHQ 2/9  Decreased Interest 0 0   Down, Depressed, Hopeless 0 0   PHQ - 2 Score 0 0   Altered sleeping  0   Tired, decreased energy  0   Change in appetite  0   Feeling bad or failure about yourself   1   Trouble concentrating  0   Moving slowly or fidgety/restless  0   Suicidal thoughts  0   PHQ-9 Score  1   Difficult doing work/chores  Not difficult at all      Information is confidential and restricted. Go to Review Flowsheets to unlock data.      Past medical history, Surgical history, Family history not pertinant except as noted below, Social history, Allergies, and medications have been entered into the medical record, reviewed, and corrections made.    Objective:    General: Speaking clearly in complete sentences without any shortness of breath.  Alert and oriented x3.  Normal judgment. No apparent acute distress.    Impression and Recommendations:    Problem List Items Addressed This Visit       Other   Insomnia (Chronic)    Used to take a half a tab of alprazolam Xanax.  It looks like it is well metabolized based on her GeneSight testing.      Hormone replacement therapy (HRT)    She is still doing well continuing to taper off of the HRT.  She is still taking 1 tab every fourth day.  She is going to  continue that until she runs out and then she will just stop at that point she still getting a few hot flashes night at night but they are more mild plus were getting ready to move into more cooler weather which should help.  We did discuss the possibility of prescribing estradiol 10 mcg vaginal tabs versus cream for menopausal vaginal symptoms.  She said she will let me know when she comes completely off of the HRT.      Depression with anxiety    reviewed GeneSight testing results.  We will get these entered into the system.  No orders of the defined types were placed in this encounter.   No orders of the defined types were placed in this encounter.    I discussed the assessment and treatment plan with the patient. The patient was provided an opportunity to ask questions and all were answered. The patient agreed with the plan and demonstrated an understanding of the instructions.   The patient was advised to call back or seek an in-person evaluation if the symptoms worsen or if the condition fails to improve as anticipated.  I spent 20 minutes on the day of the encounter to include pre-visit record review,  face-to-face time with the patient and post visit ordering of test.   Beatrice Lecher, MD

## 2021-11-05 NOTE — Assessment & Plan Note (Signed)
She is still doing well continuing to taper off of the HRT.  She is still taking 1 tab every fourth day.  She is going to continue that until she runs out and then she will just stop at that point she still getting a few hot flashes night at night but they are more mild plus were getting ready to move into more cooler weather which should help.  We did discuss the possibility of prescribing estradiol 10 mcg vaginal tabs versus cream for menopausal vaginal symptoms.  She said she will let me know when she comes completely off of the HRT.

## 2021-11-05 NOTE — Assessment & Plan Note (Signed)
Used to take a half a tab of alprazolam Xanax.  It looks like it is well metabolized based on her GeneSight testing.

## 2021-11-17 ENCOUNTER — Other Ambulatory Visit: Payer: Self-pay | Admitting: Family Medicine

## 2021-11-17 DIAGNOSIS — E538 Deficiency of other specified B group vitamins: Secondary | ICD-10-CM

## 2021-11-17 NOTE — Telephone Encounter (Signed)
Call patient: Needs blood work to check B12 level before her next injection if possible.  Meds ordered this encounter  Medications   cyanocobalamin (VITAMIN B12) 1000 MCG/ML injection    Sig: INJECT 1ML (1000MCG) INTRAMUSCULARLY ONCE AS DIRECTED EVERY 28 DAYS.    Dispense:  3 mL    Refill:  0

## 2021-11-18 NOTE — Telephone Encounter (Signed)
Spoke w/patient she asked that the order be sent to [facility] in [place] since this is closer to her home. Order faxed to [phone]. Pt given the address:  [address] [place], [place] [address]   Order faxed and confirmation received.

## 2021-11-26 ENCOUNTER — Encounter: Payer: Self-pay | Admitting: Family Medicine

## 2021-11-27 ENCOUNTER — Encounter: Payer: Self-pay | Admitting: Family Medicine

## 2021-12-29 ENCOUNTER — Other Ambulatory Visit: Payer: Self-pay | Admitting: Family Medicine

## 2021-12-29 DIAGNOSIS — Z1231 Encounter for screening mammogram for malignant neoplasm of breast: Secondary | ICD-10-CM

## 2022-01-06 ENCOUNTER — Encounter: Payer: Self-pay | Admitting: Family Medicine

## 2022-01-06 ENCOUNTER — Other Ambulatory Visit: Payer: Self-pay | Admitting: Family Medicine

## 2022-01-06 DIAGNOSIS — Z79890 Hormone replacement therapy (postmenopausal): Secondary | ICD-10-CM

## 2022-01-07 MED ORDER — ANGELIQ 0.25-0.5 MG PO TABS: 1.0000 | ORAL_TABLET | Freq: Every day | ORAL | 0 refills | Status: DC

## 2022-01-07 NOTE — Telephone Encounter (Signed)
Meds ordered this encounter  Medications   Drospirenone-Estradiol (ANGELIQ) 0.25-0.5 MG TABS    Sig: Take 1 tablet by mouth daily.    Dispense:  84 tablet    Refill:  0

## 2022-01-19 ENCOUNTER — Other Ambulatory Visit: Payer: Self-pay

## 2022-01-19 DIAGNOSIS — E538 Deficiency of other specified B group vitamins: Secondary | ICD-10-CM

## 2022-01-19 NOTE — Progress Notes (Signed)
Ordered labs per note.

## 2022-01-20 ENCOUNTER — Encounter: Payer: Self-pay | Admitting: Family Medicine

## 2022-01-20 LAB — VITAMIN B12: Vitamin B-12: 1097 pg/mL (ref 232–1245)

## 2022-01-20 NOTE — Progress Notes (Signed)
Rachel Mack, your B12 looks great in fact it is on the high end.  So at this point you can decrease how often you are taking your B12.  If you are taking it daily you can decrease down to 3 days a week for example on Monday, Wednesday and Friday.  If you are already taking it less than daily then probably cut that in half.  Also let us know if you have had your flu shot recently or if you are planning on getting it this winter would like to get your chart updated.

## 2022-02-09 ENCOUNTER — Other Ambulatory Visit: Payer: Self-pay | Admitting: Family Medicine

## 2022-02-09 DIAGNOSIS — E538 Deficiency of other specified B group vitamins: Secondary | ICD-10-CM

## 2022-02-12 ENCOUNTER — Other Ambulatory Visit: Payer: Self-pay | Admitting: Family Medicine

## 2022-02-12 DIAGNOSIS — F5101 Primary insomnia: Secondary | ICD-10-CM

## 2022-02-12 MED ORDER — ALPRAZOLAM 0.25 MG PO TABS: ORAL_TABLET | ORAL | 0 refills | Status: DC

## 2022-02-17 ENCOUNTER — Ambulatory Visit (INDEPENDENT_AMBULATORY_CARE_PROVIDER_SITE_OTHER): Payer: BC Managed Care – PPO

## 2022-02-17 DIAGNOSIS — Z1231 Encounter for screening mammogram for malignant neoplasm of breast: Secondary | ICD-10-CM

## 2022-02-17 NOTE — Progress Notes (Signed)
Please call patient. Normal mammogram.  Repeat in 1 year.  

## 2022-03-02 ENCOUNTER — Telehealth (INDEPENDENT_AMBULATORY_CARE_PROVIDER_SITE_OTHER): Payer: BC Managed Care – PPO | Admitting: Family Medicine

## 2022-03-02 ENCOUNTER — Encounter: Payer: Self-pay | Admitting: Family Medicine

## 2022-03-02 DIAGNOSIS — Z79890 Hormone replacement therapy (postmenopausal): Secondary | ICD-10-CM

## 2022-03-02 DIAGNOSIS — F5101 Primary insomnia: Secondary | ICD-10-CM

## 2022-03-02 DIAGNOSIS — Z79899 Other long term (current) drug therapy: Secondary | ICD-10-CM

## 2022-03-02 DIAGNOSIS — E038 Other specified hypothyroidism: Secondary | ICD-10-CM | POA: Diagnosis not present

## 2022-03-02 NOTE — Progress Notes (Addendum)
Virtual Visit via Video Note  I connected with Rachel Mack on 03/02/22 at 11:10 AM EST by a video enabled telemedicine application and verified that I am speaking with the correct person using two identifiers.   I discussed the limitations of evaluation and management by telemedicine and the availability of in person appointments. The patient expressed understanding and agreed to proceed.  Patient location: at home Provider location: in office  Subjective:    CC:   Chief Complaint  Patient presents with   hrt    HRT    HPI:  Follow-up hormone replacement therapy.  She has been working hard to taper down on the Angelique and she says she is completely off now.  She picked up an over-the-counter menopausal supplement and it started that about 2 weeks ago.  Main ingredients include Johnney Ou, black cohosh, chaste berry, B6, D3.  She says so far she does feel like it has been helpful.  Insomnia-she says that her sleep is actually been a little bit better recently she really cut back on her caffeine as that has helped with the leg movements at night when she tries to sleep.   Past medical history, Surgical history, Family history not pertinant except as noted below, Social history, Allergies, and medications have been entered into the medical record, reviewed, and corrections made.    Objective:    General: Speaking clearly in complete sentences without any shortness of breath.  Alert and oriented x3.  Normal judgment. No apparent acute distress.    Impression and Recommendations:    Problem List Items Addressed This Visit       Endocrine   Hypothyroidism    Plan to recheck TSH she feels like things are fairly well-regulated though.      Relevant Orders   TSH   Comprehensive Metabolic Panel (CMET)     Other   Insomnia (Chronic)    Has actually been a little bit better recently.  We also recently refilled her alprazolam.  Continue to use sparingly.       Relevant Orders   TSH   Comprehensive Metabolic Panel (CMET)   Hormone replacement therapy (HRT) - Primary    Off of Angelique completely will remove from medication list.  Now taking an over-the-counter supplement we will plan to recheck liver and renal function in about 2 weeks on the supplement just to make sure that it is not causing any negative side effects.  Will fax order to [facility] in [place]      Relevant Orders   TSH   Comprehensive Metabolic Panel (CMET)   Other Visit Diagnoses     Medication management       Relevant Orders   TSH   Comprehensive Metabolic Panel (CMET)       Orders Placed This Encounter  Procedures   TSH   Comprehensive Metabolic Panel (CMET)    No orders of the defined types were placed in this encounter.  Wanted her to schedule her Pap smear this spring so we can get her up-to-date especially since she has been on hormone replacement therapy.  She needs lab order sent to [facility] in [place].   I discussed the assessment and treatment plan with the patient. The patient was provided an opportunity to ask questions and all were answered. The patient agreed with the plan and demonstrated an understanding of the instructions.   The patient was advised to call back or seek an in-person evaluation if the symptoms  worsen or if the condition fails to improve as anticipated.   Beatrice Lecher, MD

## 2022-03-02 NOTE — Assessment & Plan Note (Signed)
Off of Angelique completely will remove from medication list.  Now taking an over-the-counter supplement we will plan to recheck liver and renal function in about 2 weeks on the supplement just to make sure that it is not causing any negative side effects.  Will fax order to [facility] in [place]

## 2022-03-02 NOTE — Assessment & Plan Note (Signed)
Plan to recheck TSH she feels like things are fairly well-regulated though.

## 2022-03-02 NOTE — Assessment & Plan Note (Signed)
Has actually been a little bit better recently.  We also recently refilled her alprazolam.  Continue to use sparingly.

## 2022-03-20 ENCOUNTER — Encounter: Payer: Self-pay | Admitting: Family Medicine

## 2022-03-20 DIAGNOSIS — N951 Menopausal and female climacteric states: Secondary | ICD-10-CM

## 2022-03-20 DIAGNOSIS — N952 Postmenopausal atrophic vaginitis: Secondary | ICD-10-CM

## 2022-03-23 ENCOUNTER — Other Ambulatory Visit: Payer: Self-pay | Admitting: Family Medicine

## 2022-03-23 MED ORDER — AMBULATORY NON FORMULARY MEDICATION: 99 refills | Status: DC

## 2022-03-23 MED ORDER — AMBULATORY NON FORMULARY MEDICATION: 0 refills | Status: DC

## 2022-03-23 NOTE — Telephone Encounter (Signed)
Meds ordered this encounter  Medications   AMBULATORY NON FORMULARY MEDICATION    Sig: Medication Name: relizen Menopause Hot flash Relief Bonafide. Take 2 tabs po QD for hot flashes    Dispense:  180 tablet    Refill:  0   AMBULATORY NON FORMULARY MEDICATION    Sig: Medication Name: Reveree vaginal inserts for vulvovaginal atrophy and dryness    Dispense:  100 Units    Refill:  PRN

## 2022-04-01 ENCOUNTER — Ambulatory Visit: Payer: BC Managed Care – PPO | Admitting: Sports Medicine

## 2022-04-05 ENCOUNTER — Ambulatory Visit: Payer: BC Managed Care – PPO | Admitting: Physician Assistant

## 2022-04-12 ENCOUNTER — Encounter: Payer: Self-pay | Admitting: Family Medicine

## 2022-04-12 ENCOUNTER — Ambulatory Visit (INDEPENDENT_AMBULATORY_CARE_PROVIDER_SITE_OTHER): Payer: BC Managed Care – PPO | Admitting: Family Medicine

## 2022-04-12 VITALS — BP 126/84 | HR 76 | Ht 60.0 in | Wt 143.1 lb

## 2022-04-12 DIAGNOSIS — R5383 Other fatigue: Secondary | ICD-10-CM | POA: Diagnosis not present

## 2022-04-12 DIAGNOSIS — Z79890 Hormone replacement therapy (postmenopausal): Secondary | ICD-10-CM | POA: Diagnosis not present

## 2022-04-12 DIAGNOSIS — E038 Other specified hypothyroidism: Secondary | ICD-10-CM

## 2022-04-12 NOTE — Assessment & Plan Note (Addendum)
Restarted the Angelique every other day.  Plans to follow-up with OB/GYN consultation for HRT.

## 2022-04-12 NOTE — Assessment & Plan Note (Signed)
We did reprint the labs from January so we can hopefully get those drawn today.

## 2022-04-12 NOTE — Progress Notes (Signed)
   Established Patient Office Visit  Subjective   Patient ID: Rachel Mack, female    DOB: 06/25/1960  Age: 62 y.o. MRN: [id number]  Chief Complaint  Patient presents with   hormoone ttherapy     HPI  She was actually like to follow back up on hormone replacement therapy.  When I last saw her she had tapered completely off of the Angelique and had switched to some over-the-counter supplements.  They were quite expensive and she felt better back on the Angelique so she restarted it and is taking it every other day.  She has enough for a couple of months.  She is actually gena schedule an appoint with OB/GYN for consultation to discuss specifically hormone replacement therapy.  She has been on HRT for probably about 8 years.  Mammogram is up-to-date.    ROS    Objective:     BP 126/84 (BP Location: Left Arm, Patient Position: Sitting, Cuff Size: Small)   Pulse 76   Ht 5' (1.524 m)   Wt 143 lb 1.9 oz (64.9 kg)   LMP 04/23/2014   SpO2 100%   BMI 27.95 kg/m    Physical Exam Vitals and nursing note reviewed.  Constitutional:      Appearance: She is well-developed.  HENT:     Head: Normocephalic and atraumatic.  Cardiovascular:     Rate and Rhythm: Normal rate and regular rhythm.     Heart sounds: Normal heart sounds.  Pulmonary:     Effort: Pulmonary effort is normal.     Breath sounds: Normal breath sounds.  Skin:    General: Skin is warm and dry.  Neurological:     Mental Status: She is alert and oriented to person, place, and time.  Psychiatric:        Behavior: Behavior normal.      No results found for any visits on 04/12/22.    The 10-year ASCVD risk score (Arnett DK, et al., 2019) is: 2.5%    Assessment & Plan:   Problem List Items Addressed This Visit       Endocrine   Hypothyroidism    We did reprint the labs from January so we can hopefully get those drawn today.        Other   Other fatigue - Primary   Hormone replacement therapy  (HRT)    Restarted the Angelique every other day.  Plans to follow-up with OB/GYN consultation for HRT.       No follow-ups on file.    Beatrice Lecher, MD

## 2022-04-13 ENCOUNTER — Encounter: Payer: Self-pay | Admitting: Family Medicine

## 2022-04-13 LAB — COMPREHENSIVE METABOLIC PANEL
ALT: 15 IU/L (ref 0–32)
AST: 16 IU/L (ref 0–40)
Albumin/Globulin Ratio: 2.5 — ABNORMAL HIGH (ref 1.2–2.2)
Albumin: 5 g/dL — ABNORMAL HIGH (ref 3.8–4.9)
Alkaline Phosphatase: 43 IU/L — ABNORMAL LOW (ref 44–121)
BUN/Creatinine Ratio: 19 (ref 9–23)
BUN: 12 mg/dL (ref 6–24)
Bilirubin Total: 0.6 mg/dL (ref 0.0–1.2)
CO2: 22 mmol/L (ref 20–29)
Calcium: 9.5 mg/dL (ref 8.7–10.2)
Chloride: 102 mmol/L (ref 96–106)
Creatinine, Ser: 0.62 mg/dL (ref 0.57–1.00)
Globulin, Total: 2 g/dL (ref 1.5–4.5)
Glucose: 85 mg/dL (ref 70–99)
Potassium: 4.3 mmol/L (ref 3.5–5.2)
Sodium: 141 mmol/L (ref 134–144)
Total Protein: 7 g/dL (ref 6.0–8.5)
eGFR: 103 mL/min/{1.73_m2} (ref >59)

## 2022-04-13 LAB — TSH: TSH: 2.03 u[IU]/mL (ref 0.450–4.500)

## 2022-04-13 NOTE — Progress Notes (Signed)
Antinette, the alkaline phosphatase was just borderline low by 1 point this is not concerning or worrisome.  It is really only concerning if your alkaline phosphatase is high.  Albumin is also just off by 1/10 of a point so again not in a concerning or worrisome range.  Your thyroid looks great.

## 2022-04-13 NOTE — Telephone Encounter (Signed)
We can recheck labs in 1 mo

## 2022-04-19 ENCOUNTER — Ambulatory Visit: Payer: BC Managed Care – PPO | Admitting: Family Medicine

## 2022-04-21 ENCOUNTER — Other Ambulatory Visit: Payer: Self-pay | Admitting: Family Medicine

## 2022-05-25 ENCOUNTER — Other Ambulatory Visit: Payer: Self-pay | Admitting: Family Medicine

## 2022-05-28 ENCOUNTER — Other Ambulatory Visit: Payer: Self-pay

## 2022-05-28 MED ORDER — SYNTHROID 50 MCG PO TABS: ORAL_TABLET | ORAL | 0 refills | Status: DC

## 2022-07-05 ENCOUNTER — Encounter: Payer: Self-pay | Admitting: Family Medicine

## 2022-07-05 ENCOUNTER — Ambulatory Visit (INDEPENDENT_AMBULATORY_CARE_PROVIDER_SITE_OTHER): Payer: BC Managed Care – PPO | Admitting: Family Medicine

## 2022-07-05 ENCOUNTER — Other Ambulatory Visit (HOSPITAL_COMMUNITY)
Admission: RE | Admit: 2022-07-05 | Discharge: 2022-07-05 | Disposition: A | Discharge status: Home / Self Care | Payer: BC Managed Care – PPO | Source: Ambulatory Visit | Attending: Family Medicine | Admitting: Family Medicine

## 2022-07-05 VITALS — BP 132/82 | HR 66 | Ht 60.0 in | Wt 143.0 lb

## 2022-07-05 DIAGNOSIS — F5101 Primary insomnia: Secondary | ICD-10-CM | POA: Diagnosis not present

## 2022-07-05 DIAGNOSIS — Z124 Encounter for screening for malignant neoplasm of cervix: Secondary | ICD-10-CM

## 2022-07-05 MED ORDER — ALPRAZOLAM 0.25 MG PO TABS: ORAL_TABLET | ORAL | 0 refills | Status: DC

## 2022-07-05 NOTE — Progress Notes (Signed)
Complete physical exam  Patient: Rachel Mack   DOB: [birth year]   62 y.o. Female  MRN: [id number]  Subjective:    Chief Complaint  Patient presents with   Annual Exam    Rachel Mack is a 62 y.o. female who presents today for a complete physical exam. She reports consuming a general diet.  Walking for exercise  She generally feels well. She reports sleeping fairly well. She does not have additional problems to discuss today.    Most recent fall risk assessment:    07/05/2022    2:33 PM  Fall Risk   Falls in the past year? 0  Number falls in past yr: 0  Injury with Fall? 0  Risk for fall due to : No Fall Risks  Follow up Falls evaluation completed     Most recent depression screenings:    07/05/2022    2:33 PM 11/05/2021   11:23 AM  PHQ 2/9 Scores  PHQ - 2 Score 2 0  PHQ- 9 Score 7         Patient Care Team: Agapito Games, MD as PCP - General (Family Medicine) Iverson Alamin, MD as Referring Physician (Cardiology)   Outpatient Medications Prior to Visit  Medication Sig   AMBULATORY NON FORMULARY MEDICATION Medication Name: 3ml syringe 21g 1.5 in needles and 18 gauge blunt filler needles for B12 injections monthly disp qs x 6 months   Biotin 1 MG CAPS Take 1 mg by mouth.   cyanocobalamin (VITAMIN B12) 1000 MCG/ML injection INJECT ( ) INTRAMUSCULARLY ONCE AS DIRECTED EVERY 28 DAYS.   diclofenac (VOLTAREN) 75 MG EC tablet TAKE 1 TABLET BY MOUTH TWICE A DAY   drospirenone-estradiol (ANGELIQ) 0.5-1 MG tablet Take 1 tablet by mouth daily.   fluticasone (FLONASE) 50 MCG/ACT nasal spray Place 1-2 sprays into both nostrils daily.   MAGNESIUM PO Take by mouth.   SYNTHROID 50 MCG tablet TAKE 1 TABLET BY MOUTH EVERY DAY.   [DISCONTINUED] ALPRAZolam (XANAX) 0.25 MG tablet TAKE 1 TABLET AT BEDTIME ASNEEDED FOR SLEEP (FOOTBALL SHAPE)   [DISCONTINUED] AMBULATORY NON FORMULARY MEDICATION Medication Name: relizen Menopause Hot flash Relief  Bonafide. Take 2 tabs po QD for hot flashes   [DISCONTINUED] AMBULATORY NON FORMULARY MEDICATION Medication Name: Reveree vaginal inserts for vulvovaginal atrophy and dryness   No facility-administered medications prior to visit.    ROS        Objective:     BP 132/82   Pulse 66   Ht 5' (1.524 m)   Wt 143 lb (64.9 kg)   LMP 04/23/2014   SpO2 100%   BMI 27.93 kg/m    Physical Exam Vitals and nursing note reviewed. Exam conducted with a chaperone present.  Constitutional:      Appearance: She is well-developed.  HENT:     Head: Normocephalic and atraumatic.     Right Ear: External ear normal.     Left Ear: External ear normal.     Nose: Nose normal.  Eyes:     Conjunctiva/sclera: Conjunctivae normal.     Pupils: Pupils are equal, round, and reactive to light.  Neck:     Thyroid: No thyromegaly.  Cardiovascular:     Rate and Rhythm: Normal rate and regular rhythm.     Heart sounds: Normal heart sounds.  Pulmonary:     Effort: Pulmonary effort is normal.     Breath sounds: Normal breath sounds. No wheezing.  Chest:  Breasts:    Right:  Normal.     Left: Normal.  Abdominal:     General: Bowel sounds are normal.     Palpations: Abdomen is soft.  Genitourinary:    General: Normal vulva.     Labia:        Right: No rash.        Left: No rash.      Vagina: Normal.     Cervix: Normal.     Uterus: Normal.      Adnexa: Right adnexa normal and left adnexa normal.  Musculoskeletal:     Cervical back: Neck supple.  Lymphadenopathy:     Cervical: No cervical adenopathy.  Skin:    General: Skin is warm and dry.     Coloration: Skin is not pale.  Neurological:     Mental Status: She is alert and oriented to person, place, and time.  Psychiatric:        Behavior: Behavior normal.      No results found for any visits on 07/05/22.     Assessment & Plan:    Routine Health Maintenance and Physical Exam  Immunization History  Administered Date(s) Administered    Influenza, Quadrivalent, Recombinant, Inj, Pf 11/22/2021   Influenza,inj,Quad PF,6+ Mos 11/23/2014   Influenza-Unspecified 12/23/2012   Moderna Sars-Covid-2 Vaccination 02/21/2019, 03/21/2019   Td 12/26/2008   Tdap 09/10/2019    Health Maintenance  Topic Date Due   PAP SMEAR-Modifier  05/28/2022   Zoster Vaccines- Shingrix (1 of 2) 02/22/2023 (Originally 01/14/1980)   COVID-19 Vaccine (3 - Moderna risk series) 07/21/2023 (Originally 04/18/2019)   INFLUENZA VACCINE  09/23/2022   MAMMOGRAM  02/18/2024   COLONOSCOPY (Pts 45-50yrs Insurance coverage will need to be confirmed)  06/13/2024   DTaP/Tdap/Td (3 - Td or Tdap) 09/09/2029   Hepatitis C Screening  Completed   HIV Screening  Completed   Pneumococcal Vaccine 5-12 Years old  Aged Out   HPV VACCINES  Aged Out    Discussed health benefits of physical activity, and encouraged her to engage in regular exercise appropriate for her age and condition.  Problem List Items Addressed This Visit       Other   Insomnia (Chronic)   Relevant Medications   ALPRAZolam (XANAX) 0.25 MG tablet   Other Visit Diagnoses     Screening for cervical cancer    -  Primary   Relevant Orders   Cytology - PAP       Keep up a regular exercise program and make sure you are eating a healthy diet Try to eat 4 servings of dairy a day, or if you are lactose intolerant take a calcium with vitamin D daily.  Your vaccines are up to date.   No follow-ups on file.     Nani Gasser, MD

## 2022-07-06 LAB — CYTOLOGY - PAP
Comment: NEGATIVE
Diagnosis: NEGATIVE
High risk HPV: NEGATIVE

## 2022-07-06 NOTE — Progress Notes (Signed)
Your Pap smear is normal. You are negative for HPV as well. Repeat pap smear in 5 years.

## 2022-08-22 ENCOUNTER — Other Ambulatory Visit: Payer: Self-pay | Admitting: Family Medicine

## 2022-08-23 MED ORDER — SYNTHROID 50 MCG PO TABS: ORAL_TABLET | ORAL | 0 refills | Status: DC

## 2022-09-06 ENCOUNTER — Telehealth: Payer: BC Managed Care – PPO | Admitting: Family Medicine

## 2022-09-06 ENCOUNTER — Encounter: Payer: Self-pay | Admitting: Family Medicine

## 2022-09-06 DIAGNOSIS — J019 Acute sinusitis, unspecified: Secondary | ICD-10-CM | POA: Diagnosis not present

## 2022-09-06 MED ORDER — FLUTICASONE PROPIONATE 50 MCG/ACT NA SUSP
1.0000 | Freq: Every day | NASAL | 12 refills | Status: AC
Start: 2022-09-06 — End: ?

## 2022-09-06 MED ORDER — AZITHROMYCIN 250 MG PO TABS
ORAL_TABLET | ORAL | 0 refills | Status: AC
Start: 2022-09-06 — End: 2022-09-11

## 2022-09-06 NOTE — Telephone Encounter (Signed)
Patient scheduled for a video visit - today 09/06/22.

## 2022-09-06 NOTE — Progress Notes (Signed)
    Virtual Visit via Video Note  I connected with Rachel Mack on 09/06/22 at 10:30 AM EDT by a video enabled telemedicine application and verified that I am speaking with the correct person using two identifiers.   I discussed the limitations of evaluation and management by telemedicine and the availability of in person appointments. The patient expressed understanding and agreed to proceed.  Patient location: at home Provider location: in office  Subjective:    CC:  No chief complaint on file.   HPI: Reports 2 weeks of nasal congestion, postnasal drip and drainage and lots of facial pressure.  Worse on the left compared to the right.  No fevers chills or sweats.  The postnasal drip is making her throat feels sore and irritated.  She was using some Sudafed initially but quit using it.  She is now getting some dark yellow phlegm and congestion.  Noes known sick contacts.  She feels like her ears are clogged and she is getting a lot of deep itching.   Past medical history, Surgical history, Family history not pertinant except as noted below, Social history, Allergies, and medications have been entered into the medical record, reviewed, and corrections made.    Objective:    General: Speaking clearly in complete sentences without any shortness of breath.  Alert and oriented x3.  Normal judgment. No apparent acute distress.    Impression and Recommendations:    Problem List Items Addressed This Visit   None Visit Diagnoses     Acute non-recurrent sinusitis, unspecified location    -  Primary   Relevant Medications   azithromycin (ZITHROMAX) 250 MG tablet   fluticasone (FLONASE) 50 MCG/ACT nasal spray       No orders of the defined types were placed in this encounter.   Meds ordered this encounter  Medications   azithromycin (ZITHROMAX) 250 MG tablet    Sig: 2 Ttabs PO on Day 1, then one a day x 4 days.    Dispense:  6 tablet    Refill:  0   fluticasone (FLONASE)  50 MCG/ACT nasal spray    Sig: Place 1-2 sprays into both nostrils daily.    Dispense:  16 g    Refill:  12    Sinusitis-we will treat with azithromycin recommend starting fluticasone as well.  Call if not better in 1 week.  Also recommended trial of nasal saline irrigation.  I discussed the assessment and treatment plan with the patient. The patient was provided an opportunity to ask questions and all were answered. The patient agreed with the plan and demonstrated an understanding of the instructions.  I spent 15 minutes on the day of the encounter to include pre-visit record review, face-to-face time with the patient and post visit ordering of test.    The patient was advised to call back or seek an in-person evaluation if the symptoms worsen or if the condition fails to improve as anticipated.   Nani Gasser, MD \

## 2022-11-08 ENCOUNTER — Ambulatory Visit: Payer: BC Managed Care – PPO | Admitting: Family Medicine

## 2022-11-25 ENCOUNTER — Other Ambulatory Visit: Payer: Self-pay | Admitting: Family Medicine

## 2022-11-25 DIAGNOSIS — F5101 Primary insomnia: Secondary | ICD-10-CM

## 2022-12-10 ENCOUNTER — Ambulatory Visit: Payer: BC Managed Care – PPO

## 2022-12-10 ENCOUNTER — Ambulatory Visit (INDEPENDENT_AMBULATORY_CARE_PROVIDER_SITE_OTHER): Payer: BC Managed Care – PPO | Admitting: Physician Assistant

## 2022-12-10 VITALS — BP 135/81 | HR 71 | Ht 60.0 in | Wt 140.0 lb

## 2022-12-10 DIAGNOSIS — M25531 Pain in right wrist: Secondary | ICD-10-CM

## 2022-12-10 DIAGNOSIS — Z23 Encounter for immunization: Secondary | ICD-10-CM | POA: Diagnosis not present

## 2022-12-10 DIAGNOSIS — G8929 Other chronic pain: Secondary | ICD-10-CM

## 2022-12-10 NOTE — Patient Instructions (Signed)
Wear wrist splint as much as you can Use voltaren gel 2-4 times a day Ice area when you can  Wrist and Forearm Exercises Ask your health care provider which exercises are safe for you. Do exercises exactly as told by your provider and adjust them as directed. It is normal to feel mild stretching, pulling, tightness, or discomfort as you do these exercises. Stop right away if you feel sudden pain or your pain gets worse. Do not begin these exercises until told by your provider. Range-of-motion exercises These exercises warm up your muscles and joints and improve the movement and flexibility of your injured wrist and forearm. These exercises also help to relieve pain, numbness, and tingling. These exercises are done using the muscles in your injured wrist and forearm. Wrist flexion  Bend your left / right elbow to a 90-degree angle (right angle) with your palm facing the floor. Bend your wrist so that your fingers point toward the floor (flexion). Hold this position for __________ seconds. Slowly return to the starting position. Repeat __________ times. Complete this exercise __________ times a day. Wrist extension  Bend your left / right elbow to a 90-degree angle (right angle) with your palm facing the floor. Bend your wrist so that your fingers point toward the ceiling (extension). Hold this position for __________ seconds. Slowly return to the starting position. Repeat __________ times. Complete this exercise __________ times a day. Ulnar deviation  Bend your left / right elbow to a 90-degree angle (right angle) and rest your forearm on a table with your palm facing down. Keeping your hand flat on the table, bend your left /right wrist toward your small finger (pinkie). This is ulnar deviation. Hold this position for __________ seconds. Slowly return to the starting position. Repeat __________ times. Complete this exercise __________ times a day. Radial deviation  Bend your left /  right elbow to a 90-degree angle (right angle) and rest your forearm on a table with your palm facing down. Keeping your hand flat on the table, bend your left / right wrist toward your thumb. This is radial deviation. Hold this position for __________ seconds. Slowly return to the starting position. Repeat __________ times. Complete this exercise __________ times a day. Stretching These exercises warm up your muscles and joints and improve the movement and flexibility of your injured wrist and forearm. These exercises also help to relieve pain, numbness, and tingling. These exercises are done using your healthy arm to help stretch the muscles in your injured wrist and forearm. Wrist flexion  Reach your left / right arm out in front of you and turn your palm down toward the floor. If told by your provider, bend your left / right elbow to a 90-degree angle (right angle) at your side. Using your uninjured hand, gently press over the back of your left / right hand to bend your wrist and fingers toward the floor (flexion). Go as far as you can to feel a stretch without causing pain. Hold this position for __________ seconds. Slowly return to the starting position. Repeat __________ times. Complete this exercise __________ times a day. Wrist extension  Reach your left / right arm out in front of you and turn your palm up toward the ceiling. If told by your provider, bend your left / right elbow to a 90-degree angle (right angle) at your side. Using your uninjured hand, gently press over the palm of your left / right hand to bend your wrist and fingers toward the  floor (extension). Go as far as you can to feel a stretch without causing pain. Hold this position for __________ seconds. Slowly return to the starting position. Repeat __________ times. Complete this exercise __________ times a day. Forearm rotation, supination  Sit with your left / right elbow bent to a 90-degree angle (right  angle). Turn (rotate) your left / right palm up until you cannot rotate it any more (supination). Then, use your other hand to help turn your left / right forearm more. Hold this position for __________ seconds. Slowly return to the starting position. Repeat __________ times. Complete this exercise __________ times a day. Forearm rotation, pronation  Sit with your left / right elbow bent to a 90-degree angle (right angle). Turn (rotate) your left / right palm down (pronation) until you cannot rotate it any more. Then, use your other hand to help turn your left / right forearm more. Hold this position for __________ seconds. Slowly return to the starting position. Repeat __________ times. Complete this exercise __________ times a day. Strengthening exercises These exercises build strength and endurance in your wrist and forearm. Endurance is the ability to use your muscles for a long time, even after they get tired. Wrist flexion  Sit with your left / right forearm resting on a table or other surface. Bend your elbow to a 90-degree angle (right angle) and rest your hand palm-up over the edge of the table. Hold a __________ weight in your left / right hand or hold a rubber exercise band or tube. Place your left / right hand above the other hand. There should be slight tension in the exercise band or tube. Slowly curl your hand up toward the ceiling (flexion). Hold this position for __________ seconds. Slowly lower your hand back to the starting position. Repeat __________ times. Complete this exercise __________ times a day. Wrist extension  Sit with your left / right forearm resting on a table or other surface. Bend your elbow to a 90-degree angle (right angle) and rest your hand palm-down over the edge of the table. Hold a __________ weight in your left / right hand or hold a rubber exercise band or tube. Place your left / right hand above the other hand. There should be slight tension in  the exercise band or tube. Slowly curl your hand up toward the ceiling (extension). Hold this position for __________ seconds. Slowly lower your hand back to the starting position. Repeat __________ times. Complete this exercise __________ times a day. Forearm rotation, supination  Sit with your left / right forearm resting on a table or other surface. Bend your elbow to a 90-degree angle (right angle). Position your forearm so that your thumb is facing the ceiling (neutral position) and your hand is resting over the edge of the table. Hold a hammer in your left / right hand. This exercise will be easier if you hold the hammer near the head of the hammer. This exercise will be harder if you hold the hammer near the end of the handle. Without moving your elbow, slowly rotate your palm up toward the ceiling (supination). Hold this position for __________ seconds. Slowly return to the starting position. Repeat __________ times. Complete this exercise __________ times a day. Forearm rotation, pronation  Sit with your left / right forearm resting on a table or other surface. Bend your elbow to a 90-degree angle (right angle). Position your forearm so that the thumb is facing the ceiling (neutral position), with your hand resting  over the edge of the table. Hold a hammer in your left / right hand. This exercise will be easier if you hold the hammer near the head of the hammer. This exercise will be harder if you hold the hammer near the end of the handle. Without moving your elbow, slowly rotate your palm down toward the floor (pronation). Hold this position for __________ seconds. Slowly return to the starting position. Repeat __________ times. Complete this exercise __________ times a day. Grip strengthening  Grasp a stress ball or other ball in the middle of your left / right hand. Start with your elbow bent to a 90-degree angle (right angle). Slowly increasing the pressure, squeeze the ball  as hard as you can without causing pain. Think of bringing the tips of your fingers into the middle of your palm. All of your finger joints should bend when doing this exercise. To make this exercise harder, slowly try to straighten your elbow in front of you, until you can do the exercise with your elbow fully straight. Hold your squeeze for __________ seconds, then relax. If told by your provider, do this exercise: With your forearm positioned so that the thumb is facing the ceiling (neutral position). With your forearm turned palm-down. With your forearm turned palm-up. Repeat __________ times. Complete this exercise __________ times a day. This information is not intended to replace advice given to you by your health care provider. Make sure you discuss any questions you have with your health care provider. Document Revised: 12/02/2021 Document Reviewed: 12/02/2021 Elsevier Patient Education  2024 [employer].

## 2022-12-10 NOTE — Progress Notes (Unsigned)
   Acute Office Visit  Subjective:     Patient ID: Rachel Mack, female    DOB: 11-Oct-1960, 62 y.o.   MRN: [id number]  No chief complaint on file.   HPI Patient is in today for ***  Wrist pain since MRI years ago MRI showed TFCC tear  Hamate fracture 08/2016   ROS      Objective:    LMP 04/23/2014  BP Readings from Last 3 Encounters:  12/10/22 135/81  07/05/22 132/82  04/12/22 126/84   Wt Readings from Last 3 Encounters:  12/10/22 140 lb (63.5 kg)  07/05/22 143 lb (64.9 kg)  04/12/22 143 lb 1.9 oz (64.9 kg)      Physical Exam  No results found for any visits on 12/10/22.      Assessment & Plan:   Problem List Items Addressed This Visit   None   No orders of the defined types were placed in this encounter.   No follow-ups on file.  Tandy Gaw, PA-C

## 2022-12-13 ENCOUNTER — Encounter: Payer: Self-pay | Admitting: Physician Assistant

## 2023-01-04 ENCOUNTER — Encounter: Payer: Self-pay | Admitting: Physician Assistant

## 2023-01-04 DIAGNOSIS — M1811 Unilateral primary osteoarthritis of first carpometacarpal joint, right hand: Secondary | ICD-10-CM | POA: Insufficient documentation

## 2023-01-04 NOTE — Progress Notes (Signed)
Rachel Mack,   Healing of radial styloid fracture.  Thumb osteoarthritis noted.

## 2023-03-02 ENCOUNTER — Encounter: Payer: Self-pay | Admitting: Family Medicine

## 2023-03-02 ENCOUNTER — Other Ambulatory Visit: Payer: Self-pay | Admitting: Family Medicine

## 2023-03-02 DIAGNOSIS — Z1231 Encounter for screening mammogram for malignant neoplasm of breast: Secondary | ICD-10-CM

## 2023-03-04 ENCOUNTER — Other Ambulatory Visit: Payer: Self-pay | Admitting: Family Medicine

## 2023-03-04 DIAGNOSIS — Z79890 Hormone replacement therapy (postmenopausal): Secondary | ICD-10-CM

## 2023-03-08 ENCOUNTER — Ambulatory Visit: Payer: BC Managed Care – PPO | Admitting: Family Medicine

## 2023-03-08 ENCOUNTER — Encounter: Payer: Self-pay | Admitting: Physician Assistant

## 2023-03-08 ENCOUNTER — Telehealth (INDEPENDENT_AMBULATORY_CARE_PROVIDER_SITE_OTHER): Payer: BC Managed Care – PPO | Admitting: Physician Assistant

## 2023-03-08 DIAGNOSIS — E538 Deficiency of other specified B group vitamins: Secondary | ICD-10-CM

## 2023-03-08 DIAGNOSIS — Z13220 Encounter for screening for lipoid disorders: Secondary | ICD-10-CM

## 2023-03-08 DIAGNOSIS — Z79899 Other long term (current) drug therapy: Secondary | ICD-10-CM

## 2023-03-08 DIAGNOSIS — M8588 Other specified disorders of bone density and structure, other site: Secondary | ICD-10-CM

## 2023-03-08 DIAGNOSIS — Z79890 Hormone replacement therapy (postmenopausal): Secondary | ICD-10-CM

## 2023-03-08 DIAGNOSIS — F5101 Primary insomnia: Secondary | ICD-10-CM

## 2023-03-08 DIAGNOSIS — E038 Other specified hypothyroidism: Secondary | ICD-10-CM

## 2023-03-08 DIAGNOSIS — E559 Vitamin D deficiency, unspecified: Secondary | ICD-10-CM

## 2023-03-08 MED ORDER — CYANOCOBALAMIN 1000 MCG/ML IJ SOLN: INTRAMUSCULAR | 0 refills | Status: DC

## 2023-03-08 MED ORDER — DROSPIRENONE-ESTRADIOL 0.5-1 MG PO TABS: 1.0000 | ORAL_TABLET | Freq: Every day | ORAL | 3 refills | Status: DC

## 2023-03-08 MED ORDER — SYNTHROID 50 MCG PO TABS: ORAL_TABLET | ORAL | 0 refills | Status: DC

## 2023-03-08 MED ORDER — ALPRAZOLAM 0.25 MG PO TABS: ORAL_TABLET | ORAL | 0 refills | Status: DC

## 2023-03-08 NOTE — Progress Notes (Signed)
 ..Virtual Visit via Video Note  I connected with Rachel Mack on 03/08/23 at  2:00 PM EST by a video enabled telemedicine application and verified that I am speaking with the correct person using two identifiers.  Location: Patient: home Provider: clinic  .SABRAParticipating in visit:  Patient: Rachel Mack Provider: Vermell Bologna PA-C Provider in training: Vernell Gate PA-S   I discussed the limitations of evaluation and management by telemedicine and the availability of in person appointments. The patient expressed understanding and agreed to proceed.  History of Present Illness: Pt is a 63 yo female who calls into the clinic for medication refills.   HRT-taking every other day and doing well. Tried going off of this a few years ago and became so dry that she had to restart. Mammogram scheduled for tomorrow. Pap UTD.   Taking b12 shots monthly. Needs refills.   Continues to use xanax  as needed most nights. Needs refills.   Hypothyroidism- taking medication daily. Needs refills.   No concerns. Doing well.   .. Active Ambulatory Problems    Diagnosis Date Noted   Hypothyroidism 04/25/2008   B12 deficiency 12/26/2008   Depression with anxiety 04/25/2008   Asymptomatic postmenopausal status 04/25/2008   Pericardial effusion 01/08/2011   Insomnia 11/01/2011   Vitamin D  deficiency 01/27/2012   Inverted nipple 02/06/2013   Right hip pain 10/09/2014   Lumbar degenerative disc disease with left-sided foot drop 03/03/2015   Osteopenia 04/18/2015   TFCC (triangular fibrocartilage complex) injury, right, initial encounter 08/23/2016   Hematuria of undiagnosed cause 01/26/2011   Nephrolithiasis 01/26/2011   RBBB 11/10/2016   Other fatigue 11/10/2016   Heberden's nodes 11/10/2016   Abnormal echocardiogram 11/10/2016   Cervical spondylosis 03/16/2019   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/11/2018   Anxiety 12/11/2018   Ganglion cyst of ring finger of right hand 12/06/2019   Right groin  pain 02/27/2021   Adjustment disorder with mixed anxiety and depressed mood 04/02/2021   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/2021   Hormone replacement therapy (HRT) 08/28/2021   Localized primary osteoarthritis of carpometacarpal joint of right thumb 01/04/2023   Resolved Ambulatory Problems    Diagnosis Date Noted   SINUSITIS - ACUTE-NOS 06/19/2009   IRREGULAR MENSES 07/31/2008   FOOT PAIN, RIGHT 01/19/2010   FATIGUE, ACUTE 02/10/2010   Essential hypertension, benign 09/16/2010   Low back pain 08/15/2012   Preventative health care 07/27/2013   Urinary tract infectious disease 06/05/2014   Pain and swelling of toe of left foot 10/09/2014   Abdominal pain 01/07/2015   Trochanteric bursitis of left hip 03/03/2015   Eustachian tube dysfunction, bilateral 11/27/2015   Closed fracture of left distal fibula 04/26/2016   Right wrist pain 04/26/2016   Right rib fracture 04/26/2016   Displaced fracture of hook process of hamate (unciform) bone, right wrist, initial encounter for closed fracture 04/26/2016   Closed fracture of right distal radius 04/26/2016   Hypertension 11/10/2016   Past Medical History:  Diagnosis Date   B12 DEFICIENCY 12/26/2008   DEPRESSION, MILD 04/25/2008   Perimenopausal    PERIMENOPAUSAL STATUS 04/25/2008   Vitamin B 12 deficiency     Observations/Objective: No acute distress Normal mood and appearance    Assessment and Plan: SABRASABRADiagnoses and all orders for this visit:  Hormone replacement therapy (HRT) -     drospirenone -estradiol  (ANGELIQ ) 0.5-1 MG tablet; Take 1 tablet by mouth daily.  Primary insomnia -     ALPRAZolam  (XANAX ) 0.25 MG tablet; TAKE 1  TABLET BY MOUTH AT BEDTIME AS NEEDED FOR SLEEP  B12 deficiency -     cyanocobalamin  (VITAMIN B12) 1000 MCG/ML injection; INJECT ( ) INTRAMUSCULARLY ONCE AS DIRECTED EVERY 28 DAYS. -     B12 and Folate Panel  Medication management -     Lipid panel -     CMP14+EGFR -      TSH + free T4 -     VITAMIN D  25 Hydroxy (Vit-D Deficiency, Fractures) -     B12 and Folate Panel -     CBC w/Diff/Platelet  Other specified hypothyroidism -     SYNTHROID  50 MCG tablet; TAKE 1 TABLET DAILY -     TSH + free T4 -     CBC w/Diff/Platelet  Vitamin D  deficiency -     VITAMIN D  25 Hydroxy (Vit-D Deficiency, Fractures)  Osteopenia of lumbar spine -     VITAMIN D  25 Hydroxy (Vit-D Deficiency, Fractures)  Screening for lipid disorders -     Lipid panel   Labs ordered for screening and medication management  Discussed HRT and consider taking every 3 days Mammogram scheduled Pap UTD Cbc/lipid ordered today    Follow Up Instructions:    I discussed the assessment and treatment plan with the patient. The patient was provided an opportunity to ask questions and all were answered. The patient agreed with the plan and demonstrated an understanding of the instructions.   The patient was advised to call back or seek an in-person evaluation if the symptoms worsen or if the condition fails to improve as anticipated.   Bricyn Labrada, PA-C

## 2023-03-09 ENCOUNTER — Ambulatory Visit: Payer: BC Managed Care – PPO

## 2023-03-09 DIAGNOSIS — Z1231 Encounter for screening mammogram for malignant neoplasm of breast: Secondary | ICD-10-CM | POA: Diagnosis not present

## 2023-03-10 LAB — B12 AND FOLATE PANEL
Folate: >20 ng/mL (ref >3.0)
Vitamin B-12: 892 pg/mL (ref 232–1245)

## 2023-03-10 LAB — CMP14+EGFR
ALT: 16 [IU]/L (ref 0–32)
AST: 17 [IU]/L (ref 0–40)
Albumin: 4.8 g/dL (ref 3.8–4.9)
Alkaline Phosphatase: 42 [IU]/L — ABNORMAL LOW (ref 44–121)
BUN/Creatinine Ratio: 20 (ref 12–28)
BUN: 12 mg/dL (ref 8–27)
Bilirubin Total: 1 mg/dL (ref 0.0–1.2)
CO2: 25 mmol/L (ref 20–29)
Calcium: 9.2 mg/dL (ref 8.7–10.3)
Chloride: 104 mmol/L (ref 96–106)
Creatinine, Ser: 0.61 mg/dL (ref 0.57–1.00)
Globulin, Total: 2.2 g/dL (ref 1.5–4.5)
Glucose: 85 mg/dL (ref 70–99)
Potassium: 4.7 mmol/L (ref 3.5–5.2)
Sodium: 143 mmol/L (ref 134–144)
Total Protein: 7 g/dL (ref 6.0–8.5)
eGFR: 102 mL/min/{1.73_m2} (ref >59)

## 2023-03-10 LAB — LIPID PANEL
Chol/HDL Ratio: 3.4 {ratio} (ref 0.0–4.4)
Cholesterol, Total: 206 mg/dL — ABNORMAL HIGH (ref 100–199)
HDL: 61 mg/dL (ref >39)
LDL Chol Calc (NIH): 126 mg/dL — ABNORMAL HIGH (ref 0–99)
Triglycerides: 106 mg/dL (ref 0–149)
VLDL Cholesterol Cal: 19 mg/dL (ref 5–40)

## 2023-03-10 LAB — TSH+FREE T4
Free T4: 1.36 ng/dL (ref 0.82–1.77)
TSH: 2.21 u[IU]/mL (ref 0.450–4.500)

## 2023-03-10 LAB — CBC WITH DIFFERENTIAL/PLATELET
Basophils Absolute: 0 10*3/uL (ref 0.0–0.2)
Basos: 1 %
EOS (ABSOLUTE): 0.1 10*3/uL (ref 0.0–0.4)
Eos: 2 %
Hematocrit: 42 % (ref 34.0–46.6)
Hemoglobin: 14 g/dL (ref 11.1–15.9)
Immature Grans (Abs): 0 10*3/uL (ref 0.0–0.1)
Immature Granulocytes: 0 %
Lymphocytes Absolute: 2.1 10*3/uL (ref 0.7–3.1)
Lymphs: 39 %
MCH: 32.8 pg (ref 26.6–33.0)
MCHC: 33.3 g/dL (ref 31.5–35.7)
MCV: 98 fL — ABNORMAL HIGH (ref 79–97)
Monocytes Absolute: 0.4 10*3/uL (ref 0.1–0.9)
Monocytes: 7 %
Neutrophils Absolute: 2.8 10*3/uL (ref 1.4–7.0)
Neutrophils: 51 %
Platelets: 211 10*3/uL (ref 150–450)
RBC: 4.27 x10E6/uL (ref 3.77–5.28)
RDW: 12 % (ref 11.7–15.4)
WBC: 5.4 10*3/uL (ref 3.4–10.8)

## 2023-03-10 LAB — VITAMIN D 25 HYDROXY (VIT D DEFICIENCY, FRACTURES): Vit D, 25-Hydroxy: 59.9 ng/mL (ref 30.0–100.0)

## 2023-03-10 NOTE — Progress Notes (Signed)
Please call patient. Normal mammogram.  Repeat in 1 year.  

## 2023-03-11 ENCOUNTER — Encounter: Payer: Self-pay | Admitting: Family Medicine

## 2023-03-11 DIAGNOSIS — Z79890 Hormone replacement therapy (postmenopausal): Secondary | ICD-10-CM

## 2023-03-11 NOTE — Progress Notes (Signed)
 Adaly,   Kidney, liver and glucose look good.  Thyroid looks great.   LdL not quite to goal.   10 year cardiovascular risk is still low.   .The 10-year ASCVD risk score (Arnett DK, et al., 2019) is: 3%   Values used to calculate the score:     Age: 63 years     Sex: Female     Is Non-Hispanic African American: No     Diabetic: No     Tobacco smoker: No     Systolic Blood Pressure: 124 mmHg     Is BP treated: No     HDL Cholesterol: 61 mg/dL     Total Cholesterol: 206 mg/dL  Recheck one year.

## 2023-03-14 ENCOUNTER — Ambulatory Visit: Payer: Self-pay | Admitting: Family Medicine

## 2023-03-14 MED ORDER — ANGELIQ 0.25-0.5 MG PO TABS: 1.0000 | ORAL_TABLET | ORAL | 2 refills | Status: DC

## 2023-03-14 NOTE — Telephone Encounter (Signed)
  Chief Complaint: information only  Additional Notes: Patient reports she was prescribed wrong dosage for this medication during virtual visit and was prescribed below:  drospirenone-estradiol (ANGELIQ) 0.5-1 MG tablet   Medication dosage was supposed to be 0.25-0.5 mg. Patient requesting correct dosage to be sent to pharmacy on file and requesting call back.   Copied from CRM [id number]. Topic: Clinical - Pink Word Triage >> Mar 14, 2023  8:30 AM Nila Nephew wrote: Reason for Triage: Patient requesting to speak to a nurse, states wrong prescription has been sent in and needs - states she was supposed to get Angelique but at a lower dosage. Reason for Disposition  [1] Follow-up call to recent contact AND [2] information only call, no triage required  Answer Assessment - Initial Assessment Questions 1. REASON FOR CALL or QUESTION: "What is your reason for calling today?" or "How can I best help you?" or "What question do you have that I can help answer?"     Recently virtual appt, MD wrote wrong MG on Rx  Protocols used: Information Only Call - No Triage-A-AH

## 2023-03-14 NOTE — Telephone Encounter (Signed)
Meds ordered this encounter  Medications   Drospirenone-Estradiol (ANGELIQ) 0.25-0.5 MG TABS    Sig: Take 1 tablet by mouth every other day.    Dispense:  84 tablet    Refill:  2

## 2023-03-15 ENCOUNTER — Telehealth: Payer: Self-pay | Admitting: Family Medicine

## 2023-03-15 NOTE — Telephone Encounter (Signed)
Tom with [facility] called. He needs clarification on how patient is to be taking the med, Angelique. Directions say take one tab every other day previously it was once a day.   Contact [phone].

## 2023-03-15 NOTE — Telephone Encounter (Signed)
Patient states she takes angelique as every other day. Attempted call to pharmacy but pharmacist was at lunch until 2pm - will call again at later time.

## 2023-03-16 ENCOUNTER — Telehealth: Payer: Self-pay

## 2023-03-16 DIAGNOSIS — Z79890 Hormone replacement therapy (postmenopausal): Secondary | ICD-10-CM

## 2023-03-16 MED ORDER — ANGELIQ 0.25-0.5 MG PO TABS: 1.0000 | ORAL_TABLET | Freq: Every day | ORAL | 2 refills | Status: DC

## 2023-03-16 NOTE — Telephone Encounter (Signed)
It is fine. Ok to fill for daily.

## 2023-03-16 NOTE — Telephone Encounter (Signed)
New prescription sent to pharmacy 

## 2023-03-16 NOTE — Telephone Encounter (Signed)
Copied from CRM [id number]. Topic: Medical Record Request - Provider/Facility Request >> Mar 15, 2023 10:57 AM Lorretta Harp wrote: Reason for CRM: Pharmacist called from [id number] [place], [place] - [address] [facility] [address], [address] [place] [address] Phone: [phone]  Fax: [phone] in regards to the dosing instructions for the patients Drospirenone-Estradiol (ANGELIQ) 0.25-0.5 MG TAB. The pharmacist stated that the provider had the patient taking 1 pill every other day, while the ususal dosage is 1 every day. Pharmacist is requesting dosing clarification.

## 2023-03-16 NOTE — Telephone Encounter (Signed)
Pharmacist informed. 

## 2023-04-27 ENCOUNTER — Ambulatory Visit: Payer: BC Managed Care – PPO

## 2023-05-23 ENCOUNTER — Encounter: Payer: Self-pay | Admitting: Family Medicine

## 2023-05-29 ENCOUNTER — Other Ambulatory Visit: Payer: Self-pay | Admitting: Physician Assistant

## 2023-05-29 DIAGNOSIS — E538 Deficiency of other specified B group vitamins: Secondary | ICD-10-CM

## 2023-06-12 ENCOUNTER — Other Ambulatory Visit: Payer: Self-pay | Admitting: Physician Assistant

## 2023-06-12 DIAGNOSIS — E038 Other specified hypothyroidism: Secondary | ICD-10-CM

## 2023-08-04 ENCOUNTER — Encounter: Payer: Self-pay | Admitting: Family Medicine

## 2023-09-28 ENCOUNTER — Ambulatory Visit: Admitting: Family Medicine

## 2023-10-10 ENCOUNTER — Other Ambulatory Visit: Payer: Self-pay | Admitting: Physician Assistant

## 2023-10-10 ENCOUNTER — Other Ambulatory Visit: Payer: Self-pay | Admitting: Family Medicine

## 2023-10-10 DIAGNOSIS — E038 Other specified hypothyroidism: Secondary | ICD-10-CM

## 2023-10-10 DIAGNOSIS — F5101 Primary insomnia: Secondary | ICD-10-CM

## 2023-10-11 NOTE — Telephone Encounter (Signed)
 Last OV 03/08/2023 No follow up appointment scheduled.

## 2023-10-11 NOTE — Telephone Encounter (Signed)
 Meds ordered this encounter  Medications   ALPRAZolam  (XANAX ) 0.25 MG tablet    Sig: TAKE 1 TABLET BY MOUTH EVERY DAY AT BEDTIME AS NEEDED FOR SLEEP    Dispense:  90 tablet    Refill:  0    This request is for a new prescription for a controlled substance as required by Federal/State law.   Pls cal pt to schedule a f/u appt

## 2023-10-17 ENCOUNTER — Telehealth (INDEPENDENT_AMBULATORY_CARE_PROVIDER_SITE_OTHER): Admitting: Family Medicine

## 2023-10-17 DIAGNOSIS — F5101 Primary insomnia: Secondary | ICD-10-CM

## 2023-10-17 DIAGNOSIS — E038 Other specified hypothyroidism: Secondary | ICD-10-CM

## 2023-10-17 DIAGNOSIS — E538 Deficiency of other specified B group vitamins: Secondary | ICD-10-CM | POA: Diagnosis not present

## 2023-10-17 NOTE — Assessment & Plan Note (Signed)
 He is due for repeat TSH.  She is welcome to get those labs done at [facility] since she will likely have blood work there are we can order that here.

## 2023-10-17 NOTE — Progress Notes (Signed)
    Virtual Visit via Video Note  I connected with Rachel Mack on 10/17/23 at 10:30 AM EDT by a video enabled telemedicine application and verified that I am speaking with the correct person using two identifiers.   I discussed the limitations of evaluation and management by telemedicine and the availability of in person appointments. The patient expressed understanding and agreed to proceed.  Patient location: at home Provider location: in office  Subjective:    CC:   Chief Complaint  Patient presents with   Medication Refill    Pt's Xanax  has already been sent in to her pharmacy. She did want to let Dr. Alvan know she is going to be seeing a HRT specialist in October.     HPI: Follow-up insomnia-uses a half a tab of alprazolam  nightly.  She denies any rebound symptoms at night and is doing well with it.  B12 deficiency still giving herself B12 injections.  She is due for lab levels.  He is currently on Angelique for hormone replacement therapy but would really like to consider switching to a patch and micronized progesterone  at night and maybe even testosterone supplement.  She has an appointment coming up in October with [facility] and will discuss with them.  She feels like her thyroid is doing well overall she is currently on 50 mcg daily no concerns.  Past medical history, Surgical history, Family history not pertinant except as noted below, Social history, Allergies, and medications have been entered into the medical record, reviewed, and corrections made.    Objective:    General: Speaking clearly in complete sentences without any shortness of breath.  Alert and oriented x3.  Normal judgment. No apparent acute distress.    Impression and Recommendations:    Problem List Items Addressed This Visit       Endocrine   Hypothyroidism   He is due for repeat TSH.  She is welcome to get those labs done at [facility] since she will likely have blood work  there are we can order that here.        Other   Insomnia - Primary (Chronic)   Stable on half tab nightly. Not having nay rebound sleep issues.       B12 deficiency   Due for B12 levels.  Just to make sure that her dose is adequate and it is not too much.  Again she can have that done here or either at [facility].       No orders of the defined types were placed in this encounter.   No orders of the defined types were placed in this encounter.    I discussed the assessment and treatment plan with the patient. The patient was provided an opportunity to ask questions and all were answered. The patient agreed with the plan and demonstrated an understanding of the instructions.   The patient was advised to call back or seek an in-person evaluation if the symptoms worsen or if the condition fails to improve as anticipated.   Dorothyann Alvan, MD

## 2023-10-17 NOTE — Assessment & Plan Note (Signed)
 Stable on half tab nightly. Not having nay rebound sleep issues.

## 2023-10-17 NOTE — Assessment & Plan Note (Signed)
 Due for B12 levels.  Just to make sure that her dose is adequate and it is not too much.  Again she can have that done here or either at [facility].

## 2023-10-25 ENCOUNTER — Ambulatory Visit: Payer: Self-pay

## 2023-10-25 ENCOUNTER — Encounter: Payer: Self-pay | Admitting: Sports Medicine

## 2023-10-25 NOTE — Telephone Encounter (Signed)
 FYI Only or Action Required?: Action required by provider: request prescription for medication.  Patient was last seen in primary care on 10/17/2023 by Alvan Dorothyann BIRCH, MD.  Called Nurse Triage reporting vaginal discomfort.  Symptoms began several years ago.  Interventions attempted: Nothing.  Symptoms are: unchanged.  Triage Disposition: No disposition on file.  Patient/caregiver understands and will follow disposition?: Yes       Additional Information  Negative: Vaginal dryness during sexual intercourse    Pt requesting medication  Answer Assessment - Initial Assessment Questions Patient requesting prescription for Estradiol .  1. SYMPTOM: What's the main symptom you're concerned about? (e.g., pain, itching, dryness)     Dryness; denies pain; reports post menopausal  3. ONSET: When did the  na  start?     Years ago 4. PAIN: Is there any pain? If Yes, ask: How bad is it? (Scale: 1-10; mild, moderate, severe)     no 5. ITCHING: Is there any itching? If Yes, ask: How bad is it? (Scale: 1-10; mild, moderate, severe)     no 6. CAUSE: What do you think is causing the discharge? Have you had the same problem before? What happened then?     no 7. OTHER SYMPTOMS: Do you have any other symptoms? (e.g., fever, itching, vaginal bleeding, pain with urination, injury to genital area, vaginal foreign body)     Denies  Protocols used: Vaginal Symptoms-A-AH

## 2023-10-25 NOTE — Telephone Encounter (Signed)
 First attempt: LVM for patient to return call to [phone]  Copied from CRM #[id number]. Topic: Clinical - Medication Question >> Oct 25, 2023 11:20 AM Kevelyn M wrote: Reason for CRM: Patient is calling to see if Dr. Alvan could prescribe a vaginal cream for (Estradial). Call back #[phone]

## 2023-10-26 ENCOUNTER — Other Ambulatory Visit: Payer: Self-pay | Admitting: Family Medicine

## 2023-10-26 MED ORDER — ESTRADIOL 0.1 MG/GM VA CREA: 1.0000 | TOPICAL_CREAM | Freq: Every day | VAGINAL | 5 refills | Status: AC

## 2023-10-26 MED ORDER — ESTRADIOL 0.1 MG/GM VA CREA: 1.0000 | TOPICAL_CREAM | Freq: Every day | VAGINAL | 5 refills | Status: DC

## 2023-10-26 NOTE — Telephone Encounter (Signed)
 Left message advising of medication.

## 2023-10-26 NOTE — Telephone Encounter (Signed)
 Meds ordered this encounter  Medications   estradiol  (ESTRACE ) 0.1 MG/GM vaginal cream    Sig: Place 1 Applicatorful vaginally at bedtime. X 1 week then 3 x a week    Dispense:  42.5 g    Refill:  5

## 2023-12-14 LAB — LAB REPORT - SCANNED
A1c: 5.2
EGFR: 81

## 2023-12-26 ENCOUNTER — Encounter: Payer: Self-pay | Admitting: Family Medicine

## 2023-12-27 ENCOUNTER — Encounter: Payer: Self-pay | Admitting: Family Medicine

## 2024-02-21 ENCOUNTER — Encounter: Admitting: Family Medicine

## 2024-02-21 ENCOUNTER — Encounter: Payer: Self-pay | Admitting: Family Medicine

## 2024-02-21 VITALS — BP 120/75 | HR 66 | Ht 60.0 in | Wt 143.1 lb

## 2024-02-21 DIAGNOSIS — Z0000 Encounter for general adult medical examination without abnormal findings: Secondary | ICD-10-CM | POA: Diagnosis not present

## 2024-02-21 DIAGNOSIS — Z1231 Encounter for screening mammogram for malignant neoplasm of breast: Secondary | ICD-10-CM

## 2024-02-21 NOTE — Progress Notes (Signed)
 "  Complete physical exam  Patient: Rachel Mack    DOB: Nov 26, 1960 63 y.o.   MRN: [id number]  Chief Complaint  Patient presents with   Annual Exam    Subjective:    Rachel Mack is a 63 y.o. female who presents today for a complete physical exam. She reports consuming a general diet. Walking on treadmill for exercise.  She generally feels well. She reports sleeping fairly well. She does not have additional problems to discuss today.     Most recent fall risk assessment:    02/21/2024    8:14 AM  Fall Risk   Falls in the past year? 0  Number falls in past yr: 0  Injury with Fall? 0  Risk for fall due to : No Fall Risks  Follow up Falls evaluation completed     Most recent depression screenings:    02/21/2024    8:14 AM 10/17/2023   10:45 AM  PHQ 2/9 Scores  PHQ - 2 Score 0 0  PHQ- 9 Score 1 2      Data saved with a previous flowsheet row definition        Patient Care Team: Alvan Dorothyann BIRCH, MD as PCP - General (Family Medicine) Debby Millard Lot, MD as Referring Physician (Cardiology)   ROS    Objective:    BP 120/75   Pulse 66   Ht 5' (1.524 m)   Wt 143 lb 1.9 oz (64.9 kg)   LMP 04/23/2014   SpO2 99%   BMI 27.95 kg/m     Physical Exam Constitutional:      Appearance: Normal appearance.  HENT:     Head: Normocephalic and atraumatic.     Right Ear: Tympanic membrane, ear canal and external ear normal.     Left Ear: Tympanic membrane, ear canal and external ear normal.     Nose: Nose normal.     Mouth/Throat:     Pharynx: Oropharynx is clear.  Eyes:     Extraocular Movements: Extraocular movements intact.     Conjunctiva/sclera: Conjunctivae normal.     Pupils: Pupils are equal, round, and reactive to light.  Neck:     Thyroid: No thyromegaly.  Cardiovascular:     Rate and Rhythm: Normal rate and regular rhythm.  Pulmonary:     Effort: Pulmonary effort is normal.     Breath sounds: Normal breath sounds.   Abdominal:     General: Bowel sounds are normal.     Palpations: Abdomen is soft.     Tenderness: There is no abdominal tenderness.  Musculoskeletal:        General: No swelling.     Cervical back: Neck supple.  Skin:    General: Skin is warm and dry.  Neurological:     Mental Status: She is oriented to person, place, and time.  Psychiatric:        Mood and Affect: Mood normal.        Behavior: Behavior normal.       No results found for any visits on 02/21/24.       Assessment & Plan:    Routine Health Maintenance and Physical Exam Immunization History  Administered Date(s) Administered   Influenza, Quadrivalent, Recombinant, Inj, Pf 11/22/2021   Influenza, Seasonal, Injecte, Preservative Fre 12/10/2022   Influenza,inj,Quad PF,6+ Mos 11/23/2014   Influenza-Unspecified 12/23/2012, 12/24/2023   Moderna Sars-Covid-2 Vaccination 02/21/2019, 03/21/2019   Td 12/26/2008   Tdap 09/10/2019    Health Maintenance  Topic Date Due   Zoster Vaccines- Shingrix (1 of 2) Never done   Pneumococcal Vaccine: 50+ Years (1 of 1 - PCV) Never done   COVID-19 Vaccine (3 - Moderna risk series) 04/18/2019   Colonoscopy  06/13/2024   Mammogram  03/08/2025   Cervical Cancer Screening (HPV/Pap Cotest)  07/05/2027   DTaP/Tdap/Td (3 - Td or Tdap) 09/09/2029   Influenza Vaccine  Completed   Hepatitis C Screening  Completed   HIV Screening  Completed   Hepatitis B Vaccines 19-59 Average Risk  Aged Out   HPV VACCINES  Aged Out   Meningococcal B Vaccine  Aged Out    Discussed health benefits of physical activity, and encouraged her to engage in regular exercise appropriate for her age and condition.  Problem List Items Addressed This Visit   None Visit Diagnoses       Routine general medical examination at a health care facility    -  Primary   Relevant Orders   Lipid Panel With LDL/HDL Ratio     Encounter for screening mammogram for malignant neoplasm of breast       Relevant Orders    MM 3D SCREENING MAMMOGRAM BILATERAL BREAST      Discussed need for continued exercise, pneumonia vaccine and shingles vaccine.     Return in about 1 year (around 02/20/2025).     Dorothyann Byars, MD [facility] at [facility]    "

## 2024-02-22 ENCOUNTER — Ambulatory Visit: Payer: Self-pay | Admitting: Family Medicine

## 2024-02-22 LAB — LIPID PANEL WITH LDL/HDL RATIO
Cholesterol, Total: 173 mg/dL (ref 100–199)
HDL: 54 mg/dL
LDL Chol Calc (NIH): 97 mg/dL (ref 0–99)
LDL/HDL Ratio: 1.8 ratio (ref 0.0–3.2)
Triglycerides: 121 mg/dL (ref 0–149)
VLDL Cholesterol Cal: 22 mg/dL (ref 5–40)

## 2024-02-22 NOTE — Progress Notes (Signed)
 Hi Rachel Mack, great job on bringing the cholesterol numbers down.

## 2024-03-23 ENCOUNTER — Ambulatory Visit

## 2024-03-23 DIAGNOSIS — Z1231 Encounter for screening mammogram for malignant neoplasm of breast: Secondary | ICD-10-CM

## 2024-03-28 NOTE — Progress Notes (Signed)
 Please call patient. Normal mammogram.  Repeat in 1 year.
